# Patient Record
Sex: Male | Born: 1965 | Race: White
Health system: Southern US, Community
[De-identification: ages and names within clinical notes are randomized; demographics above are authoritative.]

## PROBLEM LIST (undated history)

## (undated) DIAGNOSIS — Z955 Presence of coronary angioplasty implant and graft: Secondary | ICD-10-CM

## (undated) DIAGNOSIS — R0602 Shortness of breath: Secondary | ICD-10-CM

## (undated) DIAGNOSIS — E785 Hyperlipidemia, unspecified: Secondary | ICD-10-CM

## (undated) DIAGNOSIS — I214 Non-ST elevation (NSTEMI) myocardial infarction: Secondary | ICD-10-CM

## (undated) DIAGNOSIS — I2510 Atherosclerotic heart disease of native coronary artery without angina pectoris: Secondary | ICD-10-CM

## (undated) DIAGNOSIS — F329 Major depressive disorder, single episode, unspecified: Secondary | ICD-10-CM

## (undated) DIAGNOSIS — F32A Depression, unspecified: Secondary | ICD-10-CM

## (undated) DIAGNOSIS — I201 Angina pectoris with documented spasm: Secondary | ICD-10-CM

## (undated) DIAGNOSIS — Q2549 Other congenital malformations of aorta: Secondary | ICD-10-CM

## (undated) DIAGNOSIS — I4901 Ventricular fibrillation: Secondary | ICD-10-CM

## (undated) DIAGNOSIS — Z95810 Presence of automatic (implantable) cardiac defibrillator: Secondary | ICD-10-CM

## (undated) DIAGNOSIS — F419 Anxiety disorder, unspecified: Secondary | ICD-10-CM

## (undated) HISTORY — DX: Non-ST elevation (NSTEMI) myocardial infarction: I21.4

## (undated) HISTORY — DX: Shortness of breath: R06.02

## (undated) HISTORY — DX: Hyperlipidemia, unspecified: E78.5

## (undated) HISTORY — DX: Presence of coronary angioplasty implant and graft: Z95.5

## (undated) HISTORY — DX: Atherosclerotic heart disease of native coronary artery without angina pectoris: I25.10

## (undated) HISTORY — PX: CORONARY ANGIOPLASTY: SHX604

## (undated) HISTORY — DX: Other congenital malformations of aorta: Q25.49

## (undated) HISTORY — PX: OTHER SURGICAL HISTORY: SHX169

## (undated) HISTORY — DX: Presence of automatic (implantable) cardiac defibrillator: Z95.810

## (undated) HISTORY — DX: Angina pectoris with documented spasm: I20.1

## (undated) HISTORY — DX: Ventricular fibrillation: I49.01

---

## 2010-08-31 ENCOUNTER — Inpatient Hospital Stay (HOSPITAL_COMMUNITY): Admission: EM | Admit: 2010-08-31 | Discharge: 2010-09-10 | Payer: Self-pay | Admitting: Emergency Medicine

## 2010-08-31 ENCOUNTER — Ambulatory Visit: Payer: Self-pay | Admitting: Cardiology

## 2010-08-31 ENCOUNTER — Ambulatory Visit: Payer: Self-pay | Admitting: Pulmonary Disease

## 2010-09-01 ENCOUNTER — Encounter: Payer: Self-pay | Admitting: Cardiovascular Disease

## 2010-09-09 ENCOUNTER — Ambulatory Visit: Payer: Self-pay | Admitting: Physical Medicine & Rehabilitation

## 2010-09-11 ENCOUNTER — Encounter: Payer: Self-pay | Admitting: Internal Medicine

## 2010-09-18 ENCOUNTER — Telehealth: Payer: Self-pay | Admitting: Internal Medicine

## 2010-09-18 ENCOUNTER — Encounter: Payer: Self-pay | Admitting: Internal Medicine

## 2010-09-18 ENCOUNTER — Ambulatory Visit: Payer: Self-pay

## 2010-10-02 ENCOUNTER — Ambulatory Visit: Payer: Self-pay | Admitting: Cardiovascular Disease

## 2010-10-02 DIAGNOSIS — I4901 Ventricular fibrillation: Secondary | ICD-10-CM | POA: Insufficient documentation

## 2010-10-15 DIAGNOSIS — I201 Angina pectoris with documented spasm: Secondary | ICD-10-CM | POA: Insufficient documentation

## 2010-10-20 ENCOUNTER — Telehealth (INDEPENDENT_AMBULATORY_CARE_PROVIDER_SITE_OTHER): Payer: Self-pay | Admitting: *Deleted

## 2010-10-21 ENCOUNTER — Encounter: Payer: Self-pay | Admitting: Cardiovascular Disease

## 2010-11-19 ENCOUNTER — Ambulatory Visit: Payer: Self-pay | Admitting: Cardiovascular Disease

## 2010-11-19 ENCOUNTER — Encounter: Payer: Self-pay | Admitting: Cardiovascular Disease

## 2010-11-24 ENCOUNTER — Encounter: Payer: Self-pay | Admitting: Cardiovascular Disease

## 2010-11-24 ENCOUNTER — Encounter (INDEPENDENT_AMBULATORY_CARE_PROVIDER_SITE_OTHER): Payer: Self-pay | Admitting: *Deleted

## 2010-11-24 LAB — CONVERTED CEMR LAB
ALT: 10 units/L (ref 0–53)
AST: 14 units/L (ref 0–37)
Albumin: 4.6 g/dL (ref 3.5–5.2)
Alkaline Phosphatase: 67 units/L (ref 39–117)
BUN: 17 mg/dL (ref 6–23)
CO2: 26 meq/L (ref 19–32)
Calcium: 9.6 mg/dL (ref 8.4–10.5)
Chloride: 100 meq/L (ref 96–112)
Cholesterol: 203 mg/dL — ABNORMAL HIGH (ref 0–200)
Creatinine, Ser: 1.01 mg/dL (ref 0.40–1.50)
Glucose, Bld: 72 mg/dL (ref 70–99)
HDL: 54 mg/dL (ref >39)
LDL Cholesterol: 110 mg/dL — ABNORMAL HIGH (ref 0–99)
PSA: 0.39 ng/mL (ref <=4.00)
Potassium: 4.5 meq/L (ref 3.5–5.3)
Sodium: 138 meq/L (ref 135–145)
TSH: 4.778 microintl units/mL — ABNORMAL HIGH (ref 0.350–4.500)
Total Bilirubin: 0.5 mg/dL (ref 0.3–1.2)
Total CHOL/HDL Ratio: 3.8
Total Protein: 7.3 g/dL (ref 6.0–8.3)
Triglycerides: 195 mg/dL — ABNORMAL HIGH (ref <150)
VLDL: 39 mg/dL (ref 0–40)

## 2010-11-27 ENCOUNTER — Encounter: Payer: Self-pay | Admitting: Cardiovascular Disease

## 2010-12-10 ENCOUNTER — Ambulatory Visit: Payer: Self-pay | Admitting: Internal Medicine

## 2010-12-11 ENCOUNTER — Encounter: Payer: Self-pay | Admitting: Internal Medicine

## 2011-01-27 NOTE — Assessment & Plan Note (Signed)
Summary: EPH   Visit Type:  Post-hospital Primary Provider:  None  CC:  Heart burn constantly.  History of Present Illness: 45 year-old male presents for followup evaluation after recent hospitalization. The patient had an out-of-hospital VF arrest and was defibrillated by EMS. He was intubated, admitted to the CCU, and underwent induced hypothermia. He developed recurrent VF arrest and following defibrillation he had marked inferoposterior injury current on his EKG. He was taken for emergent cardiac cath and was found to have patent coronaries. Vasospasm was presumed and an AICD was placed. He presents today for followup.  He continues to have episodic chest pains, predominately at night. He calls this 'heartburn' and 'indigestion,' but has not had these symptoms until recently. Denies exertional chest pain or dyspnea. No other complaints.    Current Medications (verified): 1)  Aspirin 325 Mg Tabs (Aspirin) .... Take 1 Tablet By Mouth Once Daily 2)  Diltiazem Hcl Cr 240 Mg Xr24h-Cap (Diltiazem Hcl) .... Take 1 Tablet By Mouth Once Daily  Allergies: 1)  ! * Smallpox  Past History:  Past medical history reviewed for relevance to current acute and chronic problems.  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 smoking for more than 20 years Alcohol use- yes No drug use  Review of Systems       Negative except as per HPI   Vital Signs:  Patient profile:   45 year old male Height:      75 inches Weight:      166.75 pounds BMI:     20.92 Pulse rate:   56 / minute Pulse rhythm:   regular Resp:     18 per minute BP sitting:   110 / 80  (left arm) Cuff size:   large  Vitals Entered By: Vikki Ports (October 02, 2010 2:04 PM)  Physical  Exam  General:  Pt is alert and oriented, in no acute distress. Flat affect. HEENT: normal Neck: normal carotid upstrokes without bruits, JVP normal Lungs: CTA CV: RRR without murmur or gallop Abd: soft, NT, positive BS, no bruit, no organomegaly Ext: no clubbing, cyanosis, or edema. peripheral pulses 2+ and equal Skin: warm and dry without rash    EKG  Procedure date:  10/02/2010  Findings:      Sinus bradycardia otherwise within normal limits HR 56 bpm.   ICD Specifications Following MD:  Hillis Range, MD     ICD Vendor:  St Jude     ICD Model Number:  [id number]     ICD Serial Number:  [id number] ICD DOI:  09/08/2010     ICD Implanting MD:  Hillis Range, MD  Lead 1:    Location: RV     DOI: 09/08/2010     Model #: 8119     Serial #: [id number]     Status: active  Indications::  VF arrest   Huston Foley Parameters Mode VVI     Lower Rate Limit:  40      Tachy Zones VF:  222     VT:  176     Impression & Recommendations:  Problem # 1:  VENTRICULAR FIBRILLATION (ICD-427.41) No ICD discharges. Will have upcoming device and EP followup.  His updated medication list for this problem includes:    Aspirin 325 Mg Tabs (Aspirin) .Marland KitchenMarland KitchenMarland KitchenMarland Kitchen  Take 1 tablet by mouth once daily    Diltiazem Hcl 90 Mg Tabs (Diltiazem hcl) .Marland Kitchen... 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 We discussed event recording for ST segment analysis, but cost is a major issue. Will empirically start a PPI. Also will increase diltiazem and change to a short-acting preparation to reduce cost. Will followup in one month. The patient feels depressed and I have taken the liberty of starting him on an SSRI until he is able to establish with a PCP.  His updated medication list for this problem includes:    Aspirin 325 Mg Tabs (Aspirin) .Marland Kitchen... Take 1 tablet by mouth once daily    Diltiazem  Hcl 90 Mg Tabs (Diltiazem hcl) .Marland Kitchen... Take one tablet three times a day  Patient Instructions: 1)  Your physician recommends that you schedule a follow-up appointment in: 1 MONTH 2)  Your physician has recommended you make the following change in your medication: START Ranitidine 150mg  two times a day, START Citalopram 20mg  once a day (future refills should come from PCP), START Diltiazem 90mg  one tablet by mouth three times a day  Prescriptions: CITALOPRAM HYDROBROMIDE 20 MG TABS (CITALOPRAM HYDROBROMIDE) take one tablet daily  #90 x 0   Entered by:   Julieta Gutting, RN, BSN   Authorized by:   Norva Karvonen, MD   Signed by:   Julieta Gutting, RN, BSN on 10/02/2010   Method used:   Electronically to        [facility] (retail)       [address]       [place], [place]  [address]       Ph: [phone]       Fax: [phone]   RxID:   [id number] RANITIDINE HCL 150 MG TABS (RANITIDINE HCL) take one tablet two times a day  #180 x 2   Entered by:   Julieta Gutting, RN, BSN   Authorized by:   Norva Karvonen, MD   Signed by:   Julieta Gutting, RN, BSN on 10/02/2010   Method used:   Electronically to        [facility] (retail)       [address]       [place], [place]  [address]       Ph: [phone]       Fax: [phone]   RxID:   [id number] DILTIAZEM HCL 90 MG TABS (DILTIAZEM HCL) take one tablet three times a day  #270 x 3   Entered by:   Julieta Gutting, RN, BSN   Authorized by:   Norva Karvonen, MD   Signed by:   Julieta Gutting, RN, BSN on 10/02/2010   Method used:   Electronically to        [facility] (retail)       [address]       [place], [place]  [address]       Ph: [phone]       Fax: [phone]   RxID:   [id number]

## 2011-01-27 NOTE — Assessment & Plan Note (Signed)
Summary: ROV   Visit Type:  Follow-up Primary Elias Bordner:  None  CC:  dizziness. - meds side effects.  History of Present Illness: 45 year-old male presents for followup evaluation. The patient had an out-of-hospital VF arrest earlier this year. He was intubated, admitted to the CCU, and underwent induced hypothermia. He developed recurrent VF arrest and following defibrillation he had marked inferoposterior injury current on his EKG. He was taken for emergent cardiac cath and was found to have patent coronaries. Vasospasm was presumed and an AICD was placed. He presents today for followup.  The patient is doing better overall. He reports dizziness and attributes this to taking diltiazem. He denies syncope. 'Indigestion' is improved and he denies exertional chest pain or dyspnea. He does complain of fatigue. He was started on citalopram at his last visit here and his mother who is with him today feels it has really helped.  Current Medications (verified): 1)  Aspirin 325 Mg Tabs (Aspirin) .... Take 1 Tablet By Mouth Once Daily 2)  Diltiazem Hcl 90 Mg Tabs (Diltiazem Hcl) .... Take One Tablet Three Times A Day 3)  Ranitidine Hcl 150 Mg Tabs (Ranitidine Hcl) .... Take One Tablet Two Times A Day 4)  Citalopram Hydrobromide 20 Mg Tabs (Citalopram Hydrobromide) .... Take One Tablet Daily  Allergies: 1)  ! * Smallpox  Past History:  Past medical history reviewed for relevance to current acute and chronic problems.  Past Medical History: Nonobstructive CAD VF arrest Preserved LV function Suspected coronary vasospasm S/P ICD  Review of Systems       Negative except as per HPI   Vital Signs:  Patient profile:   45 year old male Height:      75 inches Weight:      174.50 pounds BMI:     21.89 Pulse rate:   54 / minute Pulse rhythm:   regular Resp:     18 per minute BP sitting:   120 / 90  (left arm) Cuff size:   large  Vitals Entered By: Vikki Ports (November 19, 2010 4:00  PM)  Physical Exam  General:  Pt is alert and oriented, in no acute distress. Flat affect. HEENT: normal Neck: normal carotid upstrokes without bruits, JVP normal Lungs: CTA CV: RRR without murmur or gallop Abd: soft, NT, positive BS, no bruit, no organomegaly Ext: no clubbing, cyanosis, or edema. peripheral pulses 2+ and equal Skin: warm and dry without rash    EKG  Procedure date:  11/19/2010  Findings:      Sinus brady 53 bpm, otherwise within normal limits   ICD Specifications Following MD:  Hillis Range, MD     ICD Vendor:  St Jude     ICD Model Number:  [phone]     ICD Serial Number:  [id number] ICD DOI:  09/08/2010     ICD Implanting MD:  Hillis Range, MD  Lead 1:    Location: RV     DOI: 09/08/2010     Model #: 8119     Serial #: [id number]     Status: active  Indications::  VF arrest   Huston Foley Parameters Mode VVI     Lower Rate Limit:  40      Tachy Zones VF:  222     VT:  176     Impression & Recommendations:  Problem # 1:  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 I have trouble differentiating between angina and heartburn, but his symptoms are  better on diltiazem and ranitidine. Recommend decrease diltiazem to 1/2 tablet three times a day to see if this helps with his dizziness.  I asked the patient to have his lipids checked at [facility] as I could not find records of a lipid panel in his hospital records. Pending those results will consider a statin if needed.  His updated medication list for this problem includes:    Aspirin 325 Mg Tabs (Aspirin) .Marland Kitchen... Take 1 tablet by mouth once daily    Diltiazem Hcl 90 Mg Tabs (Diltiazem hcl) .Marland Kitchen... Take one-half  tablet three times a day  Orders: EKG w/ Interpretation (93000)  Problem # 2:  VENTRICULAR FIBRILLATION (ICD-427.41) S/P ICD. No device discharges. Followup per protocol.  His updated medication list for this problem includes:    Aspirin 325 Mg Tabs (Aspirin) .Marland Kitchen... Take 1 tablet by  mouth once daily    Diltiazem Hcl 90 Mg Tabs (Diltiazem hcl) .Marland Kitchen... Take one-half  tablet three times a day  Orders: EKG w/ Interpretation (93000)  Patient Instructions: 1)  Your physician recommends that you schedule a follow-up appointment in: 3 MONTHS 2)  Your physician recommends that you have a FASTING LIPID Panel with [facility] with your next blood draw. ([phone]  Please have results faxed to [phone] ATTN: Lauren RN 3)  Your physician has recommended you make the following change in your medication: DECREASE Diltiazem to 90mg  take one-half tablet three times a day

## 2011-01-27 NOTE — Cardiovascular Report (Signed)
Summary: Office Visit   Office Visit   Imported By: Roderic Ovens 09/24/2010 15:25:19  _____________________________________________________________________  External Attachment:    Type:   Image     Comment:   External Document

## 2011-01-27 NOTE — Progress Notes (Signed)
Summary: pt's mom has questions  Phone Note Call from Patient   Caller: Claudean Kinds [phone] Reason for Call: Talk to Nurse Summary of Call: pt's mom was told they would get a card stating that pt has a pacemaker-is his something that will be mailedto them because they didn't get one yet,also wants to know how to get a medic alert braclet Initial call taken by: Glynda Jaeger,  September 18, 2010 4:13 PM  Additional Follow-up for Phone Call Additional follow up Details #1::        pt received id card on 09-22-10.  Vella Kohler  September 23, 2010 12:01 PM

## 2011-01-27 NOTE — Miscellaneous (Signed)
Summary: Device preload  Clinical Lists Changes  Observations: Added new observation of ICD INDICATN: VF arrest (09/11/2010 12:41) Added new observation of ICDLEADSTAT1: active (09/11/2010 12:41) Added new observation of ICDLEADSER1: [id number] (09/11/2010 12:41) Added new observation of ICDLEADMOD1: 7122  (09/11/2010 12:41) Added new observation of ICDLEADLOC1: RV  (09/11/2010 12:41) Added new observation of ICD IMP MD: Hillis Range, MD  (09/11/2010 12:41) Added new observation of ICDLEADDOI1: 09/08/2010  (09/11/2010 12:41) Added new observation of ICD IMPL DTE: 09/08/2010  (09/11/2010 12:41) Added new observation of ICD SERL#: [id number]  (09/11/2010 12:41) Added new observation of ICD MODL#: [id number]  (09/11/2010 30:86) Added new observation of ICDMANUFACTR: St Jude  (09/11/2010 12:41) Added new observation of ICD MD: Hillis Range, MD  (09/11/2010 12:41)       ICD Specifications Following MD:  Hillis Range, MD     ICD Vendor:  St Jude     ICD Model Number:  [id number]     ICD Serial Number:  [id number] ICD DOI:  09/08/2010     ICD Implanting MD:  Hillis Range, MD  Lead 1:    Location: RV     DOI: 09/08/2010     Model #: 4132     Serial #: [id number]     Status: active  Indications::  VF arrest

## 2011-01-27 NOTE — Procedures (Signed)
Summary: wound check.sjm.amber   Current Medications (verified): 1)  Aspirin 325 Mg Tabs (Aspirin) .... Take 1 Tablet By Mouth Once Daily 2)  Diltiazem Hcl Cr 240 Mg Xr24h-Cap (Diltiazem Hcl) .... Take 1 Tablet By Mouth Once Daily  Allergies (verified): 1)  ! * Smallpox    ICD Specifications Following MD:  Hillis Range, MD     ICD Vendor:  St Jude     ICD Model Number:  [phone]     ICD Serial Number:  [id number] ICD DOI:  09/08/2010     ICD Implanting MD:  Hillis Range, MD  Lead 1:    Location: RV     DOI: 09/08/2010     Model #: 8119     Serial #: [id number]     Status: active  Indications::  VF arrest   ICD Follow Up Battery Voltage:  >95% V     Charge Time:  8.0 seconds     Battery Est. Longevity:  7.7-9.2 yrs Underlying rhythm:  SR   ICD Device Measurements Right Ventricle:  Amplitude: 9.9 mV, Impedance: 390 ohms, Threshold: 0.75 V at 0.5 msec Shock Impedance: 59 ohms   Episodes MS Episodes:  0     Shock:  0     ATP:  0     Nonsustained:  0     Atrial Therapies:  0 Ventricular Pacing:  <1%  Brady Parameters Mode VVI     Lower Rate Limit:  40      Tachy Zones VF:  222     VT:  176     Tech Comments:  WOUND CHECK---STERI STRIPS REMOVED.  NO REDNESS OR SWELLING AT SITE.  NORMAL DEVICE FUNCTION.  NO EPISODES SINCE IMPLANT.  NO CHANGES MADE. ROV 12-10-10 @ 945 W/JA.  WENT OVER INSTRUCTIONS ABOUT BEING SHOCKED. Vella Kohler  September 18, 2010 3:13 PM

## 2011-01-27 NOTE — Miscellaneous (Signed)
Summary: Home Health Certification/Care Plan   Home Health Certification/Care Plan   Imported By: Roderic Ovens 11/13/2010 17:37:19  _____________________________________________________________________  External Attachment:    Type:   Image     Comment:   External Document

## 2011-01-27 NOTE — Progress Notes (Signed)
  DDS request received sent to [employer]. [facility]  October 20, 2010 8:55 AM

## 2011-01-29 ENCOUNTER — Encounter (INDEPENDENT_AMBULATORY_CARE_PROVIDER_SITE_OTHER): Payer: Self-pay | Admitting: *Deleted

## 2011-01-29 LAB — CONVERTED CEMR LAB: TSH: 2.023 microintl units/mL (ref 0.350–4.500)

## 2011-01-29 NOTE — Procedures (Signed)
Summary: Cardiology Device Clinic   Current Medications (verified): 1)  Aspirin 325 Mg Tabs (Aspirin) .... Take 1 Tablet By Mouth Once Daily 2)  Diltiazem Hcl 90 Mg Tabs (Diltiazem Hcl) .... Take One-Half  Tablet Three Times A Day 3)  Ranitidine Hcl 150 Mg Tabs (Ranitidine Hcl) .... Take One Tablet Two Times A Day 4)  Citalopram Hydrobromide 20 Mg Tabs (Citalopram Hydrobromide) .... Take One Tablet Daily 5)  Synthroid 25 Mcg Tabs (Levothyroxine Sodium) .... Once Daily 6)  Vitamin C 500 Mg  Tabs (Ascorbic Acid) .... Once Daily  Allergies (verified): 1)  ! * Smallpox   ICD Specifications Following MD:  Hillis Range, MD     ICD Vendor:  St Jude     ICD Model Number:  [phone]     ICD Serial Number:  [id number] ICD DOI:  09/08/2010     ICD Implanting MD:  Hillis Range, MD  Lead 1:    Location: RV     DOI: 09/08/2010     Model #: 7829     Serial #: [id number]     Status: active  Indications::  VF arrest   ICD Follow Up Battery Voltage:  95% V     Charge Time:  8.0 seconds     Battery Est. Longevity:  9.1 yrs Underlying rhythm:  SR   ICD Device Measurements Right Ventricle:  Amplitude: 11.6 mV, Impedance: 350 ohms, Threshold: 0.75 V at 0.5 msec Shock Impedance: 65 ohms   Episodes MS Episodes:  0     Shock:  0     ATP:  0     Nonsustained:  0     Atrial Therapies:  0 Ventricular Pacing:  <1%  Brady Parameters Mode VVI     Lower Rate Limit:  40      Tachy Zones VF:  222     VT:  176     Next Cardiology Appt Due:  02/26/2011 Tech Comments:  NORMAL DEVICE FUNCTION.  NO EPISODES SINCE LAST CHECK.  CHANGED RV OUTPUT FROM 3.5 TO 2.5 V.  ROV IN 3 MTHS W/DEVICE CLINIC. Vella Kohler  December 11, 2010 9:08 AM

## 2011-01-29 NOTE — Letter (Signed)
Summary: [facility]   [facility]   Imported By: Roderic Ovens 12/18/2010 11:17:17  _____________________________________________________________________  External Attachment:    Type:   Image     Comment:   External Document

## 2011-01-29 NOTE — Miscellaneous (Signed)
Summary: [facility] Phase 2 for Qualified Medicaid Patients Repo  [facility] Phase 2 for Qualified Medicaid Patients Report   Imported By: Roderic Ovens 12/17/2010 15:56:22  _____________________________________________________________________  External Attachment:    Type:   Image     Comment:   External Document

## 2011-01-29 NOTE — Assessment & Plan Note (Signed)
Summary: defib check.sjm.amber   Visit Type:  Follow-up Referring Oney Folz:  Dr Excell Seltzer Primary Davonne Jarnigan:  None   History of Present Illness: The patient presents today for routine electrophysiology followup. He reports doing very well since having his ICD implanted. The patient denies symptoms of palpitations, chest pain, shortness of breath, orthopnea, PND, lower extremity edema, dizziness, presyncope, syncope, or neurologic sequela. The patient is tolerating medications without difficulties and is otherwise without complaint today.   Current Medications (verified): 1)  Aspirin 325 Mg Tabs (Aspirin) .... Take 1 Tablet By Mouth Once Daily 2)  Diltiazem Hcl 90 Mg Tabs (Diltiazem Hcl) .... Take One-Half  Tablet Three Times A Day 3)  Ranitidine Hcl 150 Mg Tabs (Ranitidine Hcl) .... Take One Tablet Two Times A Day 4)  Citalopram Hydrobromide 20 Mg Tabs (Citalopram Hydrobromide) .... Take One Tablet Daily 5)  Synthroid 25 Mcg Tabs (Levothyroxine Sodium) .... Once Daily 6)  Vitamin C 500 Mg  Tabs (Ascorbic Acid) .... Once Daily  Allergies: 1)  ! * Smallpox  Past History:  Past Medical History: Reviewed history from 11/19/2010 and no changes required. 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  ICD implantation with defibrillation threshold testing.      Social History: Reviewed history from 10/02/2010 and no changes required. Divorced 2 children quit smoking 2011. smoking for more than 20 years Alcohol use- yes No drug use  Review of Systems       All systems are reviewed and negative except as listed in the HPI.   Vital Signs:  Patient profile:   45 year old male Height:      75 inches Weight:      179 pounds BMI:     22.45 Pulse rate:   66 / minute BP sitting:   110 / 84  (left arm)  Vitals Entered By: Laurance Flatten CMA (December 10, 2010  4:13 PM)  Physical Exam  General:  Well developed, well nourished, in no acute distress. Head:  normocephalic and atraumatic Mouth:  Teeth, gums and palate normal. Oral mucosa normal. Neck:  supple Chest Wall:  ICD pocket is well healed Lungs:  Clear bilaterally to auscultation and percussion. Heart:  Non-displaced PMI, chest non-tender; regular rate and rhythm, S1, S2 without murmurs, rubs or gallops. Carotid upstroke normal, no bruit. Normal abdominal aortic size, no bruits. Femorals normal pulses, no bruits. Pedals normal pulses. No edema, no varicosities. Abdomen:  Bowel sounds positive; abdomen soft and non-tender without masses, organomegaly, or hernias noted. No hepatosplenomegaly. Msk:  Back normal, normal gait. Muscle strength and tone normal. Extremities:  No clubbing or cyanosis. Neurologic:  Alert and oriented x 3.    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 If you don't receive a letter, please call our office to schedule the follow-up appointment.

## 2011-02-26 ENCOUNTER — Encounter: Payer: Self-pay | Admitting: Cardiovascular Disease

## 2011-02-26 ENCOUNTER — Encounter: Payer: Self-pay | Admitting: Internal Medicine

## 2011-02-26 ENCOUNTER — Encounter (INDEPENDENT_AMBULATORY_CARE_PROVIDER_SITE_OTHER): Payer: Self-pay

## 2011-02-26 ENCOUNTER — Ambulatory Visit (INDEPENDENT_AMBULATORY_CARE_PROVIDER_SITE_OTHER): Payer: Self-pay | Admitting: Cardiovascular Disease

## 2011-02-26 DIAGNOSIS — I428 Other cardiomyopathies: Secondary | ICD-10-CM

## 2011-02-26 DIAGNOSIS — I2510 Atherosclerotic heart disease of native coronary artery without angina pectoris: Secondary | ICD-10-CM

## 2011-02-26 DIAGNOSIS — R072 Precordial pain: Secondary | ICD-10-CM

## 2011-03-04 ENCOUNTER — Telehealth (INDEPENDENT_AMBULATORY_CARE_PROVIDER_SITE_OTHER): Payer: Self-pay | Admitting: *Deleted

## 2011-03-05 NOTE — Assessment & Plan Note (Signed)
Summary: F3M/ FROM 11/19/10 OV/LWB/SAF   Visit Type:  Follow-up Referring Provider:  Dr Excell Seltzer Primary Provider:  None  CC:  chest pains.  History of Present Illness: 45 year-old male presents for followup evaluation. The patient had an out-of-hospital VF arrest earlier this year. He was intubated, admitted to the CCU, and underwent induced hypothermia. He developed recurrent VF arrest and following defibrillation he had marked inferoposterior injury current on his EKG. He was taken for emergent cardiac cath and was found to have patent coronaries. Vasospasm was presumed and an AICD was placed. He presents today for followup.  The patient continues to have near-daily episodes of chest pain, described as a burning, pressure-like feeling across his upper chest. These are more frequent in the morning and they feel similar to the symptoms he had leading up to his cardiac arrest. He has had no ICD discharges. He sits and rests when the chest pain occurs and symptoms usually resolve within 5 minutes. He has started a daily PPI but this hasn't helped. No other complaints.  Current Medications (verified): 1)  Aspirin 325 Mg Tabs (Aspirin) .... Take 1 Tablet By Mouth Once Daily 2)  Diltiazem Hcl 90 Mg Tabs (Diltiazem Hcl) .... Take One-Half  Tablet Three Times A Day 3)  Pantoprazole Sodium 40 Mg Tbec (Pantoprazole Sodium) .... Take 1 Tablet By Mouth Once A Day 4)  Citalopram Hydrobromide 20 Mg Tabs (Citalopram Hydrobromide) .... Take One Tablet Daily 5)  Synthroid 25 Mcg Tabs (Levothyroxine Sodium) .... Once Daily 6)  Vitamin C 500 Mg  Tabs (Ascorbic Acid) .... Once Daily 7)  Citalopram Hydrobromide 20 Mg Tabs (Citalopram Hydrobromide) .... Take 1 Tablet By Mouth Once A Day  Allergies: 1)  ! * Smallpox  Past History:  Past medical history reviewed for relevance to current acute and chronic problems.  Past Medical History: Reviewed history from 11/19/2010 and no changes required. 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 HEENT: normal Neck: normal carotid upstrokes without bruits, JVP normal Lungs: CTA CV: RRR without murmur or gallop Abd: soft, NT, positive BS, no bruit, no organomegaly Ext: no clubbing, cyanosis, or edema. peripheral pulses 2+ and equal Skin: warm and dry without rash    EKG  Procedure date:  02/26/2011  Findings:      NSR 64 bpm, within normal limits.   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 Recommend add Imdur 15 mg daily (low-dose because of low blood pressure). Will followup in 3 months to see if he has had a good symptomatic response. He is having trouble with abtaining disability and Medicaid - I am going to write him a letter to support his disability.  His updated medication list for this problem includes:    Aspirin 325 Mg Tabs (Aspirin) .Marland Kitchen... Take 1 tablet by mouth once daily    Diltiazem Hcl 90 Mg Tabs (Diltiazem hcl) .Marland Kitchen... Take one-half  tablet three times a day    Imdur 30  Mg Xr24h-tab (Isosorbide mononitrate) .Marland Kitchen... Take 1/2 tab everyday at bedtime  Problem # 2:  VENTRICULAR FIBRILLATION (ICD-427.41) No ICD discharges. Continue followup with Dr Johney Frame.  His updated medication list for this problem includes:    Aspirin 325 Mg Tabs (Aspirin) .Marland Kitchen... Take 1 tablet by mouth once daily    Diltiazem Hcl 90 Mg Tabs (Diltiazem hcl) .Marland Kitchen... Take one-half  tablet three times a day    Imdur 30 Mg Xr24h-tab (Isosorbide mononitrate) .Marland Kitchen... Take 1/2 tab everyday at bedtime  Patient Instructions: 1)  Your physician recommends that you schedule a follow-up appointment in: 3 months 2)  Your physician has recommended you make the following change in your medication: please start imdur 15mg  everyday at bedtime( 1/2 tab) Prescriptions: IMDUR 30 MG XR24H-TAB (ISOSORBIDE MONONITRATE) take 1/2 tab everyday at bedtime  #15 x 10   Entered by:   Ledon Snare, RN   Authorized by:   Norva Karvonen, MD   Signed by:   Ledon Snare, RN on 02/26/2011   Method used:   Electronically to        [facility] (retail)       [address]       [place], [place]  [address]       Ph: [phone]       Fax: [phone]   RxID:   [id number]

## 2011-03-10 NOTE — Progress Notes (Signed)
  DDS request received sent to [facility]  March 04, 2011 3:09 PM

## 2011-03-12 LAB — DIFFERENTIAL
Basophils Absolute: 0 10*3/uL (ref 0.0–0.1)
Basophils Absolute: 0 10*3/uL (ref 0.0–0.1)
Basophils Absolute: 0 10*3/uL (ref 0.0–0.1)
Basophils Relative: 0 % (ref 0–1)
Basophils Relative: 0 % (ref 0–1)
Basophils Relative: 1 % (ref 0–1)
Eosinophils Absolute: 0.1 10*3/uL (ref 0.0–0.7)
Eosinophils Absolute: 0.1 10*3/uL (ref 0.0–0.7)
Eosinophils Absolute: 0.1 10*3/uL (ref 0.0–0.7)
Eosinophils Relative: 1 % (ref 0–5)
Eosinophils Relative: 2 % (ref 0–5)
Eosinophils Relative: 2 % (ref 0–5)
Lymphocytes Relative: 20 % (ref 12–46)
Lymphocytes Relative: 23 % (ref 12–46)
Lymphocytes Relative: 25 % (ref 12–46)
Lymphs Abs: 1.2 10*3/uL (ref 0.7–4.0)
Lymphs Abs: 1.8 10*3/uL (ref 0.7–4.0)
Lymphs Abs: 1.9 10*3/uL (ref 0.7–4.0)
Monocytes Absolute: 0.5 10*3/uL (ref 0.1–1.0)
Monocytes Absolute: 0.8 10*3/uL (ref 0.1–1.0)
Monocytes Absolute: 1.2 10*3/uL — ABNORMAL HIGH (ref 0.1–1.0)
Monocytes Relative: 14 % — ABNORMAL HIGH (ref 3–12)
Monocytes Relative: 14 % — ABNORMAL HIGH (ref 3–12)
Monocytes Relative: 6 % (ref 3–12)
Neutro Abs: 4 10*3/uL (ref 1.7–7.7)
Neutro Abs: 4.8 10*3/uL (ref 1.7–7.7)
Neutro Abs: 4.8 10*3/uL (ref 1.7–7.7)
Neutrophils Relative %: 60 % (ref 43–77)
Neutrophils Relative %: 65 % (ref 43–77)
Neutrophils Relative %: 67 % (ref 43–77)

## 2011-03-12 LAB — BASIC METABOLIC PANEL
BUN: 1 mg/dL — ABNORMAL LOW (ref 6–23)
BUN: 2 mg/dL — ABNORMAL LOW (ref 6–23)
BUN: 3 mg/dL — ABNORMAL LOW (ref 6–23)
BUN: 3 mg/dL — ABNORMAL LOW (ref 6–23)
BUN: 4 mg/dL — ABNORMAL LOW (ref 6–23)
BUN: 4 mg/dL — ABNORMAL LOW (ref 6–23)
BUN: 4 mg/dL — ABNORMAL LOW (ref 6–23)
BUN: 5 mg/dL — ABNORMAL LOW (ref 6–23)
BUN: 5 mg/dL — ABNORMAL LOW (ref 6–23)
BUN: 5 mg/dL — ABNORMAL LOW (ref 6–23)
BUN: 5 mg/dL — ABNORMAL LOW (ref 6–23)
BUN: 5 mg/dL — ABNORMAL LOW (ref 6–23)
BUN: 5 mg/dL — ABNORMAL LOW (ref 6–23)
BUN: 5 mg/dL — ABNORMAL LOW (ref 6–23)
BUN: 6 mg/dL (ref 6–23)
BUN: 7 mg/dL (ref 6–23)
BUN: <1 mg/dL — ABNORMAL LOW (ref 6–23)
CO2: 17 mEq/L — ABNORMAL LOW (ref 19–32)
CO2: 18 mEq/L — ABNORMAL LOW (ref 19–32)
CO2: 18 mEq/L — ABNORMAL LOW (ref 19–32)
CO2: 18 mEq/L — ABNORMAL LOW (ref 19–32)
CO2: 18 mEq/L — ABNORMAL LOW (ref 19–32)
CO2: 18 mEq/L — ABNORMAL LOW (ref 19–32)
CO2: 18 mEq/L — ABNORMAL LOW (ref 19–32)
CO2: 19 mEq/L (ref 19–32)
CO2: 19 mEq/L (ref 19–32)
CO2: 20 mEq/L (ref 19–32)
CO2: 20 mEq/L (ref 19–32)
CO2: 20 mEq/L (ref 19–32)
CO2: 20 mEq/L (ref 19–32)
CO2: 24 mEq/L (ref 19–32)
CO2: 25 mEq/L (ref 19–32)
CO2: 25 mEq/L (ref 19–32)
CO2: 27 mEq/L (ref 19–32)
Calcium: 7.9 mg/dL — ABNORMAL LOW (ref 8.4–10.5)
Calcium: 7.9 mg/dL — ABNORMAL LOW (ref 8.4–10.5)
Calcium: 8 mg/dL — ABNORMAL LOW (ref 8.4–10.5)
Calcium: 8.1 mg/dL — ABNORMAL LOW (ref 8.4–10.5)
Calcium: 8.1 mg/dL — ABNORMAL LOW (ref 8.4–10.5)
Calcium: 8.1 mg/dL — ABNORMAL LOW (ref 8.4–10.5)
Calcium: 8.1 mg/dL — ABNORMAL LOW (ref 8.4–10.5)
Calcium: 8.2 mg/dL — ABNORMAL LOW (ref 8.4–10.5)
Calcium: 8.2 mg/dL — ABNORMAL LOW (ref 8.4–10.5)
Calcium: 8.3 mg/dL — ABNORMAL LOW (ref 8.4–10.5)
Calcium: 8.4 mg/dL (ref 8.4–10.5)
Calcium: 8.5 mg/dL (ref 8.4–10.5)
Calcium: 8.5 mg/dL (ref 8.4–10.5)
Calcium: 8.5 mg/dL (ref 8.4–10.5)
Calcium: 8.5 mg/dL (ref 8.4–10.5)
Calcium: 8.6 mg/dL (ref 8.4–10.5)
Calcium: 9.5 mg/dL (ref 8.4–10.5)
Chloride: 103 mEq/L (ref 96–112)
Chloride: 108 mEq/L (ref 96–112)
Chloride: 109 mEq/L (ref 96–112)
Chloride: 109 mEq/L (ref 96–112)
Chloride: 111 mEq/L (ref 96–112)
Chloride: 112 mEq/L (ref 96–112)
Chloride: 112 mEq/L (ref 96–112)
Chloride: 113 mEq/L — ABNORMAL HIGH (ref 96–112)
Chloride: 115 mEq/L — ABNORMAL HIGH (ref 96–112)
Chloride: 115 mEq/L — ABNORMAL HIGH (ref 96–112)
Chloride: 115 mEq/L — ABNORMAL HIGH (ref 96–112)
Chloride: 116 mEq/L — ABNORMAL HIGH (ref 96–112)
Chloride: 116 mEq/L — ABNORMAL HIGH (ref 96–112)
Chloride: 117 mEq/L — ABNORMAL HIGH (ref 96–112)
Chloride: 117 mEq/L — ABNORMAL HIGH (ref 96–112)
Chloride: 118 mEq/L — ABNORMAL HIGH (ref 96–112)
Chloride: 119 mEq/L — ABNORMAL HIGH (ref 96–112)
Creatinine, Ser: 0.52 mg/dL (ref 0.4–1.5)
Creatinine, Ser: 0.54 mg/dL (ref 0.4–1.5)
Creatinine, Ser: 0.55 mg/dL (ref 0.4–1.5)
Creatinine, Ser: 0.57 mg/dL (ref 0.4–1.5)
Creatinine, Ser: 0.63 mg/dL (ref 0.4–1.5)
Creatinine, Ser: 0.64 mg/dL (ref 0.4–1.5)
Creatinine, Ser: 0.65 mg/dL (ref 0.4–1.5)
Creatinine, Ser: 0.65 mg/dL (ref 0.4–1.5)
Creatinine, Ser: 0.67 mg/dL (ref 0.4–1.5)
Creatinine, Ser: 0.68 mg/dL (ref 0.4–1.5)
Creatinine, Ser: 0.69 mg/dL (ref 0.4–1.5)
Creatinine, Ser: 0.71 mg/dL (ref 0.4–1.5)
Creatinine, Ser: 0.87 mg/dL (ref 0.4–1.5)
Creatinine, Ser: 0.88 mg/dL (ref 0.4–1.5)
Creatinine, Ser: 0.89 mg/dL (ref 0.4–1.5)
Creatinine, Ser: 0.9 mg/dL (ref 0.4–1.5)
Creatinine, Ser: 0.95 mg/dL (ref 0.4–1.5)
GFR calc Af Amer: >60 mL/min (ref >60)
GFR calc Af Amer: >60 mL/min (ref >60)
GFR calc Af Amer: >60 mL/min (ref >60)
GFR calc Af Amer: >60 mL/min (ref >60)
GFR calc Af Amer: >60 mL/min (ref >60)
GFR calc Af Amer: >60 mL/min (ref >60)
GFR calc Af Amer: >60 mL/min (ref >60)
GFR calc Af Amer: >60 mL/min (ref >60)
GFR calc Af Amer: >60 mL/min (ref >60)
GFR calc Af Amer: >60 mL/min (ref >60)
GFR calc Af Amer: >60 mL/min (ref >60)
GFR calc Af Amer: >60 mL/min (ref >60)
GFR calc Af Amer: >60 mL/min (ref >60)
GFR calc Af Amer: >60 mL/min (ref >60)
GFR calc Af Amer: >60 mL/min (ref >60)
GFR calc Af Amer: >60 mL/min (ref >60)
GFR calc Af Amer: >60 mL/min (ref >60)
GFR calc non Af Amer: >60 mL/min (ref >60)
GFR calc non Af Amer: >60 mL/min (ref >60)
GFR calc non Af Amer: >60 mL/min (ref >60)
GFR calc non Af Amer: >60 mL/min (ref >60)
GFR calc non Af Amer: >60 mL/min (ref >60)
GFR calc non Af Amer: >60 mL/min (ref >60)
GFR calc non Af Amer: >60 mL/min (ref >60)
GFR calc non Af Amer: >60 mL/min (ref >60)
GFR calc non Af Amer: >60 mL/min (ref >60)
GFR calc non Af Amer: >60 mL/min (ref >60)
GFR calc non Af Amer: >60 mL/min (ref >60)
GFR calc non Af Amer: >60 mL/min (ref >60)
GFR calc non Af Amer: >60 mL/min (ref >60)
GFR calc non Af Amer: >60 mL/min (ref >60)
GFR calc non Af Amer: >60 mL/min (ref >60)
GFR calc non Af Amer: >60 mL/min (ref >60)
GFR calc non Af Amer: >60 mL/min (ref >60)
Glucose, Bld: 107 mg/dL — ABNORMAL HIGH (ref 70–99)
Glucose, Bld: 114 mg/dL — ABNORMAL HIGH (ref 70–99)
Glucose, Bld: 116 mg/dL — ABNORMAL HIGH (ref 70–99)
Glucose, Bld: 117 mg/dL — ABNORMAL HIGH (ref 70–99)
Glucose, Bld: 117 mg/dL — ABNORMAL HIGH (ref 70–99)
Glucose, Bld: 122 mg/dL — ABNORMAL HIGH (ref 70–99)
Glucose, Bld: 138 mg/dL — ABNORMAL HIGH (ref 70–99)
Glucose, Bld: 139 mg/dL — ABNORMAL HIGH (ref 70–99)
Glucose, Bld: 162 mg/dL — ABNORMAL HIGH (ref 70–99)
Glucose, Bld: 183 mg/dL — ABNORMAL HIGH (ref 70–99)
Glucose, Bld: 194 mg/dL — ABNORMAL HIGH (ref 70–99)
Glucose, Bld: 207 mg/dL — ABNORMAL HIGH (ref 70–99)
Glucose, Bld: 86 mg/dL (ref 70–99)
Glucose, Bld: 87 mg/dL (ref 70–99)
Glucose, Bld: 87 mg/dL (ref 70–99)
Glucose, Bld: 90 mg/dL (ref 70–99)
Glucose, Bld: 90 mg/dL (ref 70–99)
Potassium: 2.8 mEq/L — ABNORMAL LOW (ref 3.5–5.1)
Potassium: 2.9 mEq/L — ABNORMAL LOW (ref 3.5–5.1)
Potassium: 3.1 mEq/L — ABNORMAL LOW (ref 3.5–5.1)
Potassium: 3.1 mEq/L — ABNORMAL LOW (ref 3.5–5.1)
Potassium: 3.5 mEq/L (ref 3.5–5.1)
Potassium: 3.6 mEq/L (ref 3.5–5.1)
Potassium: 3.7 mEq/L (ref 3.5–5.1)
Potassium: 3.7 mEq/L (ref 3.5–5.1)
Potassium: 3.8 mEq/L (ref 3.5–5.1)
Potassium: 4 mEq/L (ref 3.5–5.1)
Potassium: 4.2 mEq/L (ref 3.5–5.1)
Potassium: 4.3 mEq/L (ref 3.5–5.1)
Potassium: 4.3 mEq/L (ref 3.5–5.1)
Potassium: 4.3 mEq/L (ref 3.5–5.1)
Potassium: 4.6 mEq/L (ref 3.5–5.1)
Potassium: 4.6 mEq/L (ref 3.5–5.1)
Potassium: 4.9 mEq/L (ref 3.5–5.1)
Sodium: 134 mEq/L — ABNORMAL LOW (ref 135–145)
Sodium: 136 mEq/L (ref 135–145)
Sodium: 137 mEq/L (ref 135–145)
Sodium: 137 mEq/L (ref 135–145)
Sodium: 137 mEq/L (ref 135–145)
Sodium: 138 mEq/L (ref 135–145)
Sodium: 138 mEq/L (ref 135–145)
Sodium: 138 mEq/L (ref 135–145)
Sodium: 138 mEq/L (ref 135–145)
Sodium: 139 mEq/L (ref 135–145)
Sodium: 139 mEq/L (ref 135–145)
Sodium: 139 mEq/L (ref 135–145)
Sodium: 140 mEq/L (ref 135–145)
Sodium: 140 mEq/L (ref 135–145)
Sodium: 140 mEq/L (ref 135–145)
Sodium: 141 mEq/L (ref 135–145)
Sodium: 141 mEq/L (ref 135–145)

## 2011-03-12 LAB — GLUCOSE, CAPILLARY
Glucose-Capillary: 100 mg/dL — ABNORMAL HIGH (ref 70–99)
Glucose-Capillary: 100 mg/dL — ABNORMAL HIGH (ref 70–99)
Glucose-Capillary: 101 mg/dL — ABNORMAL HIGH (ref 70–99)
Glucose-Capillary: 105 mg/dL — ABNORMAL HIGH (ref 70–99)
Glucose-Capillary: 105 mg/dL — ABNORMAL HIGH (ref 70–99)
Glucose-Capillary: 114 mg/dL — ABNORMAL HIGH (ref 70–99)
Glucose-Capillary: 117 mg/dL — ABNORMAL HIGH (ref 70–99)
Glucose-Capillary: 126 mg/dL — ABNORMAL HIGH (ref 70–99)
Glucose-Capillary: 131 mg/dL — ABNORMAL HIGH (ref 70–99)
Glucose-Capillary: 151 mg/dL — ABNORMAL HIGH (ref 70–99)
Glucose-Capillary: 151 mg/dL — ABNORMAL HIGH (ref 70–99)
Glucose-Capillary: 152 mg/dL — ABNORMAL HIGH (ref 70–99)
Glucose-Capillary: 171 mg/dL — ABNORMAL HIGH (ref 70–99)
Glucose-Capillary: 173 mg/dL — ABNORMAL HIGH (ref 70–99)
Glucose-Capillary: 178 mg/dL — ABNORMAL HIGH (ref 70–99)
Glucose-Capillary: 182 mg/dL — ABNORMAL HIGH (ref 70–99)
Glucose-Capillary: 184 mg/dL — ABNORMAL HIGH (ref 70–99)
Glucose-Capillary: 188 mg/dL — ABNORMAL HIGH (ref 70–99)
Glucose-Capillary: 188 mg/dL — ABNORMAL HIGH (ref 70–99)
Glucose-Capillary: 61 mg/dL — ABNORMAL LOW (ref 70–99)
Glucose-Capillary: 71 mg/dL (ref 70–99)
Glucose-Capillary: 79 mg/dL (ref 70–99)
Glucose-Capillary: 83 mg/dL (ref 70–99)
Glucose-Capillary: 83 mg/dL (ref 70–99)
Glucose-Capillary: 84 mg/dL (ref 70–99)
Glucose-Capillary: 84 mg/dL (ref 70–99)
Glucose-Capillary: 84 mg/dL (ref 70–99)
Glucose-Capillary: 85 mg/dL (ref 70–99)
Glucose-Capillary: 87 mg/dL (ref 70–99)
Glucose-Capillary: 88 mg/dL (ref 70–99)
Glucose-Capillary: 88 mg/dL (ref 70–99)
Glucose-Capillary: 89 mg/dL (ref 70–99)
Glucose-Capillary: 90 mg/dL (ref 70–99)
Glucose-Capillary: 92 mg/dL (ref 70–99)
Glucose-Capillary: 93 mg/dL (ref 70–99)
Glucose-Capillary: 93 mg/dL (ref 70–99)
Glucose-Capillary: 94 mg/dL (ref 70–99)
Glucose-Capillary: 95 mg/dL (ref 70–99)
Glucose-Capillary: 95 mg/dL (ref 70–99)
Glucose-Capillary: 96 mg/dL (ref 70–99)
Glucose-Capillary: 98 mg/dL (ref 70–99)
Glucose-Capillary: 99 mg/dL (ref 70–99)

## 2011-03-12 LAB — CBC
HCT: 32.3 % — ABNORMAL LOW (ref 39.0–52.0)
HCT: 32.6 % — ABNORMAL LOW (ref 39.0–52.0)
HCT: 33.8 % — ABNORMAL LOW (ref 39.0–52.0)
HCT: 33.9 % — ABNORMAL LOW (ref 39.0–52.0)
HCT: 34.5 % — ABNORMAL LOW (ref 39.0–52.0)
HCT: 36.2 % — ABNORMAL LOW (ref 39.0–52.0)
HCT: 36.7 % — ABNORMAL LOW (ref 39.0–52.0)
HCT: 36.8 % — ABNORMAL LOW (ref 39.0–52.0)
HCT: 36.9 % — ABNORMAL LOW (ref 39.0–52.0)
HCT: 38.4 % — ABNORMAL LOW (ref 39.0–52.0)
HCT: 39.1 % (ref 39.0–52.0)
HCT: 39.7 % (ref 39.0–52.0)
HCT: 40.1 % (ref 39.0–52.0)
Hemoglobin: 11 g/dL — ABNORMAL LOW (ref 13.0–17.0)
Hemoglobin: 11.4 g/dL — ABNORMAL LOW (ref 13.0–17.0)
Hemoglobin: 11.8 g/dL — ABNORMAL LOW (ref 13.0–17.0)
Hemoglobin: 11.9 g/dL — ABNORMAL LOW (ref 13.0–17.0)
Hemoglobin: 12.1 g/dL — ABNORMAL LOW (ref 13.0–17.0)
Hemoglobin: 12.2 g/dL — ABNORMAL LOW (ref 13.0–17.0)
Hemoglobin: 12.3 g/dL — ABNORMAL LOW (ref 13.0–17.0)
Hemoglobin: 12.5 g/dL — ABNORMAL LOW (ref 13.0–17.0)
Hemoglobin: 12.5 g/dL — ABNORMAL LOW (ref 13.0–17.0)
Hemoglobin: 13.2 g/dL (ref 13.0–17.0)
Hemoglobin: 13.5 g/dL (ref 13.0–17.0)
Hemoglobin: 13.7 g/dL (ref 13.0–17.0)
Hemoglobin: 13.8 g/dL (ref 13.0–17.0)
MCH: 32.2 pg (ref 26.0–34.0)
MCH: 32.2 pg (ref 26.0–34.0)
MCH: 32.3 pg (ref 26.0–34.0)
MCH: 32.5 pg (ref 26.0–34.0)
MCH: 32.6 pg (ref 26.0–34.0)
MCH: 32.7 pg (ref 26.0–34.0)
MCH: 32.8 pg (ref 26.0–34.0)
MCH: 33 pg (ref 26.0–34.0)
MCH: 33.1 pg (ref 26.0–34.0)
MCH: 33.2 pg (ref 26.0–34.0)
MCH: 33.5 pg (ref 26.0–34.0)
MCH: 33.7 pg (ref 26.0–34.0)
MCH: 33.7 pg (ref 26.0–34.0)
MCHC: 33.5 g/dL (ref 30.0–36.0)
MCHC: 33.7 g/dL (ref 30.0–36.0)
MCHC: 33.7 g/dL (ref 30.0–36.0)
MCHC: 33.9 g/dL (ref 30.0–36.0)
MCHC: 34 g/dL (ref 30.0–36.0)
MCHC: 34 g/dL (ref 30.0–36.0)
MCHC: 34.4 g/dL (ref 30.0–36.0)
MCHC: 34.4 g/dL (ref 30.0–36.0)
MCHC: 34.5 g/dL (ref 30.0–36.0)
MCHC: 34.8 g/dL (ref 30.0–36.0)
MCHC: 35 g/dL (ref 30.0–36.0)
MCHC: 35.3 g/dL (ref 30.0–36.0)
MCHC: 35.8 g/dL (ref 30.0–36.0)
MCV: 93.5 fL (ref 78.0–100.0)
MCV: 94.2 fL (ref 78.0–100.0)
MCV: 94.2 fL (ref 78.0–100.0)
MCV: 94.7 fL (ref 78.0–100.0)
MCV: 95.2 fL (ref 78.0–100.0)
MCV: 95.8 fL (ref 78.0–100.0)
MCV: 96 fL (ref 78.0–100.0)
MCV: 96.3 fL (ref 78.0–100.0)
MCV: 96.3 fL (ref 78.0–100.0)
MCV: 96.6 fL (ref 78.0–100.0)
MCV: 96.8 fL (ref 78.0–100.0)
MCV: 97 fL (ref 78.0–100.0)
MCV: 97.3 fL (ref 78.0–100.0)
Platelets: 112 10*3/uL — ABNORMAL LOW (ref 150–400)
Platelets: 119 10*3/uL — ABNORMAL LOW (ref 150–400)
Platelets: 137 10*3/uL — ABNORMAL LOW (ref 150–400)
Platelets: 137 10*3/uL — ABNORMAL LOW (ref 150–400)
Platelets: 141 10*3/uL — ABNORMAL LOW (ref 150–400)
Platelets: 142 10*3/uL — ABNORMAL LOW (ref 150–400)
Platelets: 153 10*3/uL (ref 150–400)
Platelets: 158 10*3/uL (ref 150–400)
Platelets: 191 10*3/uL (ref 150–400)
Platelets: 209 10*3/uL (ref 150–400)
Platelets: 230 10*3/uL (ref 150–400)
Platelets: 57 10*3/uL — ABNORMAL LOW (ref 150–400)
Platelets: 99 10*3/uL — ABNORMAL LOW (ref 150–400)
RBC: 3.36 MIL/uL — ABNORMAL LOW (ref 4.22–5.81)
RBC: 3.43 MIL/uL — ABNORMAL LOW (ref 4.22–5.81)
RBC: 3.56 MIL/uL — ABNORMAL LOW (ref 4.22–5.81)
RBC: 3.59 MIL/uL — ABNORMAL LOW (ref 4.22–5.81)
RBC: 3.69 MIL/uL — ABNORMAL LOW (ref 4.22–5.81)
RBC: 3.78 MIL/uL — ABNORMAL LOW (ref 4.22–5.81)
RBC: 3.79 MIL/uL — ABNORMAL LOW (ref 4.22–5.81)
RBC: 3.81 MIL/uL — ABNORMAL LOW (ref 4.22–5.81)
RBC: 3.83 MIL/uL — ABNORMAL LOW (ref 4.22–5.81)
RBC: 4 MIL/uL — ABNORMAL LOW (ref 4.22–5.81)
RBC: 4.06 MIL/uL — ABNORMAL LOW (ref 4.22–5.81)
RBC: 4.12 MIL/uL — ABNORMAL LOW (ref 4.22–5.81)
RBC: 4.19 MIL/uL — ABNORMAL LOW (ref 4.22–5.81)
RDW: 11.9 % (ref 11.5–15.5)
RDW: 12 % (ref 11.5–15.5)
RDW: 12 % (ref 11.5–15.5)
RDW: 12.1 % (ref 11.5–15.5)
RDW: 12.2 % (ref 11.5–15.5)
RDW: 12.2 % (ref 11.5–15.5)
RDW: 12.3 % (ref 11.5–15.5)
RDW: 12.3 % (ref 11.5–15.5)
RDW: 12.3 % (ref 11.5–15.5)
RDW: 12.4 % (ref 11.5–15.5)
RDW: 12.4 % (ref 11.5–15.5)
RDW: 12.4 % (ref 11.5–15.5)
RDW: 12.6 % (ref 11.5–15.5)
WBC: 10.3 10*3/uL (ref 4.0–10.5)
WBC: 11.3 10*3/uL — ABNORMAL HIGH (ref 4.0–10.5)
WBC: 3.3 10*3/uL — ABNORMAL LOW (ref 4.0–10.5)
WBC: 6.2 10*3/uL (ref 4.0–10.5)
WBC: 6.4 10*3/uL (ref 4.0–10.5)
WBC: 7.1 10*3/uL (ref 4.0–10.5)
WBC: 7.3 10*3/uL (ref 4.0–10.5)
WBC: 7.5 10*3/uL (ref 4.0–10.5)
WBC: 7.6 10*3/uL (ref 4.0–10.5)
WBC: 7.8 10*3/uL (ref 4.0–10.5)
WBC: 7.9 10*3/uL (ref 4.0–10.5)
WBC: 8 10*3/uL (ref 4.0–10.5)
WBC: 8.8 10*3/uL (ref 4.0–10.5)

## 2011-03-12 LAB — BLOOD GAS, ARTERIAL
Acid-base deficit: 5.9 mmol/L — ABNORMAL HIGH (ref 0.0–2.0)
Acid-base deficit: 6.2 mmol/L — ABNORMAL HIGH (ref 0.0–2.0)
Acid-base deficit: 7.2 mmol/L — ABNORMAL HIGH (ref 0.0–2.0)
Acid-base deficit: 7.7 mmol/L — ABNORMAL HIGH (ref 0.0–2.0)
Acid-base deficit: 7.9 mmol/L — ABNORMAL HIGH (ref 0.0–2.0)
Acid-base deficit: 8 mmol/L — ABNORMAL HIGH (ref 0.0–2.0)
Bicarbonate: 16.1 mEq/L — ABNORMAL LOW (ref 20.0–24.0)
Bicarbonate: 17.6 mEq/L — ABNORMAL LOW (ref 20.0–24.0)
Bicarbonate: 17.7 mEq/L — ABNORMAL LOW (ref 20.0–24.0)
Bicarbonate: 17.7 mEq/L — ABNORMAL LOW (ref 20.0–24.0)
Bicarbonate: 17.8 mEq/L — ABNORMAL LOW (ref 20.0–24.0)
Bicarbonate: 18.1 mEq/L — ABNORMAL LOW (ref 20.0–24.0)
Drawn by: 29165
Drawn by: 311599
Drawn by: 31276
FIO2: 0.4 %
FIO2: 0.4 %
FIO2: 0.4 %
FIO2: 0.4 %
FIO2: 0.4 %
FIO2: 1 %
MECHVT: 500 mL
MECHVT: 500 mL
MECHVT: 500 mL
MECHVT: 500 mL
MECHVT: 600 mL
MECHVT: 600 mL
O2 Saturation: 98.8 %
O2 Saturation: 99.1 %
O2 Saturation: 99.4 %
O2 Saturation: 99.4 %
O2 Saturation: 99.4 %
O2 Saturation: 99.7 %
PEEP: 5 cmH2O
PEEP: 5 cmH2O
PEEP: 5 cmH2O
PEEP: 5 cmH2O
PEEP: 5 cmH2O
PEEP: 5 cmH2O
Patient temperature: 91.4
Patient temperature: 91.4
Patient temperature: 98.6
Patient temperature: 98.6
Patient temperature: 98.6
Patient temperature: 98.6
RATE: 16 resp/min
RATE: 16 resp/min
RATE: 16 resp/min
RATE: 20 resp/min
RATE: 20 resp/min
RATE: 20 resp/min
TCO2: 16.9 mmol/L (ref 0–100)
TCO2: 18.6 mmol/L (ref 0–100)
TCO2: 18.6 mmol/L (ref 0–100)
TCO2: 18.9 mmol/L (ref 0–100)
TCO2: 19 mmol/L (ref 0–100)
TCO2: 19.1 mmol/L (ref 0–100)
pCO2 arterial: 22.7 mmHg — ABNORMAL LOW (ref 35.0–45.0)
pCO2 arterial: 27.9 mmHg — ABNORMAL LOW (ref 35.0–45.0)
pCO2 arterial: 28.4 mmHg — ABNORMAL LOW (ref 35.0–45.0)
pCO2 arterial: 32.2 mmHg — ABNORMAL LOW (ref 35.0–45.0)
pCO2 arterial: 39.4 mmHg (ref 35.0–45.0)
pCO2 arterial: 39.6 mmHg (ref 35.0–45.0)
pH, Arterial: 7.273 — ABNORMAL LOW (ref 7.350–7.450)
pH, Arterial: 7.277 — ABNORMAL LOW (ref 7.350–7.450)
pH, Arterial: 7.368 (ref 7.350–7.450)
pH, Arterial: 7.38 (ref 7.350–7.450)
pH, Arterial: 7.385 (ref 7.350–7.450)
pH, Arterial: 7.484 — ABNORMAL HIGH (ref 7.350–7.450)
pO2, Arterial: 165 mmHg — ABNORMAL HIGH (ref 80.0–100.0)
pO2, Arterial: 171 mmHg — ABNORMAL HIGH (ref 80.0–100.0)
pO2, Arterial: 173 mmHg — ABNORMAL HIGH (ref 80.0–100.0)
pO2, Arterial: 174 mmHg — ABNORMAL HIGH (ref 80.0–100.0)
pO2, Arterial: 191 mmHg — ABNORMAL HIGH (ref 80.0–100.0)
pO2, Arterial: 461 mmHg — ABNORMAL HIGH (ref 80.0–100.0)

## 2011-03-12 LAB — URINALYSIS, ROUTINE W REFLEX MICROSCOPIC
Bilirubin Urine: NEGATIVE
Glucose, UA: NEGATIVE mg/dL
Ketones, ur: NEGATIVE mg/dL
Leukocytes, UA: NEGATIVE
Nitrite: NEGATIVE
Protein, ur: 100 mg/dL — AB
Specific Gravity, Urine: 1.008 (ref 1.005–1.030)
Urobilinogen, UA: 0.2 mg/dL (ref 0.0–1.0)
pH: 6.5 (ref 5.0–8.0)

## 2011-03-12 LAB — CARDIAC PANEL(CRET KIN+CKTOT+MB+TROPI)
CK, MB: 15.1 ng/mL (ref 0.3–4.0)
CK, MB: 17 ng/mL (ref 0.3–4.0)
CK, MB: 23.9 ng/mL (ref 0.3–4.0)
Relative Index: 5 — ABNORMAL HIGH (ref 0.0–2.5)
Relative Index: 5.8 — ABNORMAL HIGH (ref 0.0–2.5)
Relative Index: 7.2 — ABNORMAL HIGH (ref 0.0–2.5)
Total CK: 291 U/L — ABNORMAL HIGH (ref 7–232)
Total CK: 302 U/L — ABNORMAL HIGH (ref 7–232)
Total CK: 333 U/L — ABNORMAL HIGH (ref 7–232)
Troponin I: 0.61 ng/mL (ref 0.00–0.06)
Troponin I: 0.61 ng/mL (ref 0.00–0.06)
Troponin I: 0.69 ng/mL (ref 0.00–0.06)

## 2011-03-12 LAB — COMPREHENSIVE METABOLIC PANEL
ALT: 80 U/L — ABNORMAL HIGH (ref 0–53)
AST: 97 U/L — ABNORMAL HIGH (ref 0–37)
Albumin: 3.1 g/dL — ABNORMAL LOW (ref 3.5–5.2)
Alkaline Phosphatase: 45 U/L (ref 39–117)
BUN: 6 mg/dL (ref 6–23)
CO2: 20 mEq/L (ref 19–32)
Calcium: 8.1 mg/dL — ABNORMAL LOW (ref 8.4–10.5)
Chloride: 112 mEq/L (ref 96–112)
Creatinine, Ser: 0.95 mg/dL (ref 0.4–1.5)
GFR calc Af Amer: >60 mL/min (ref >60)
GFR calc non Af Amer: >60 mL/min (ref >60)
Glucose, Bld: 124 mg/dL — ABNORMAL HIGH (ref 70–99)
Potassium: 3.7 mEq/L (ref 3.5–5.1)
Sodium: 140 mEq/L (ref 135–145)
Total Bilirubin: 0.8 mg/dL (ref 0.3–1.2)
Total Protein: 5.5 g/dL — ABNORMAL LOW (ref 6.0–8.3)

## 2011-03-12 LAB — URINE MICROSCOPIC-ADD ON

## 2011-03-12 LAB — HEPARIN INDUCED THROMBOCYTOPENIA PNL
Heparin Induced Plt Ab: NEGATIVE
Patient O.D.: 0.131
UFH High Dose UFH H: 0 % Release
UFH Low Dose 0.1 IU/mL: 0 % Release
UFH Low Dose 0.5 IU/mL: 0 % Release
UFH SRA Result: NEGATIVE

## 2011-03-12 LAB — HEPARIN LEVEL (UNFRACTIONATED)
Heparin Unfractionated: 0.18 IU/mL — ABNORMAL LOW (ref 0.30–0.70)
Heparin Unfractionated: 0.19 IU/mL — ABNORMAL LOW (ref 0.30–0.70)
Heparin Unfractionated: 0.28 IU/mL — ABNORMAL LOW (ref 0.30–0.70)
Heparin Unfractionated: 0.32 IU/mL (ref 0.30–0.70)
Heparin Unfractionated: 0.33 IU/mL (ref 0.30–0.70)
Heparin Unfractionated: 0.38 IU/mL (ref 0.30–0.70)
Heparin Unfractionated: 0.45 IU/mL (ref 0.30–0.70)

## 2011-03-12 LAB — BRAIN NATRIURETIC PEPTIDE
Pro B Natriuretic peptide (BNP): 316 pg/mL — ABNORMAL HIGH (ref 0.0–100.0)
Pro B Natriuretic peptide (BNP): 37 pg/mL (ref 0.0–100.0)

## 2011-03-12 LAB — PHOSPHORUS
Phosphorus: 2.1 mg/dL — ABNORMAL LOW (ref 2.3–4.6)
Phosphorus: 2.9 mg/dL (ref 2.3–4.6)
Phosphorus: 3 mg/dL (ref 2.3–4.6)
Phosphorus: 4.1 mg/dL (ref 2.3–4.6)
Phosphorus: 4.3 mg/dL (ref 2.3–4.6)
Phosphorus: 4.5 mg/dL (ref 2.3–4.6)
Phosphorus: 5.2 mg/dL — ABNORMAL HIGH (ref 2.3–4.6)

## 2011-03-12 LAB — PROTIME-INR
INR: 0.95 (ref 0.00–1.49)
INR: 1.06 (ref 0.00–1.49)
INR: 1.07 (ref 0.00–1.49)
INR: 1.11 (ref 0.00–1.49)
Prothrombin Time: 12.9 seconds (ref 11.6–15.2)
Prothrombin Time: 14 seconds (ref 11.6–15.2)
Prothrombin Time: 14.1 seconds (ref 11.6–15.2)
Prothrombin Time: 14.5 seconds (ref 11.6–15.2)

## 2011-03-12 LAB — MAGNESIUM
Magnesium: 1.4 mg/dL — ABNORMAL LOW (ref 1.5–2.5)
Magnesium: 1.4 mg/dL — ABNORMAL LOW (ref 1.5–2.5)
Magnesium: 1.5 mg/dL (ref 1.5–2.5)
Magnesium: 1.5 mg/dL (ref 1.5–2.5)
Magnesium: 1.5 mg/dL (ref 1.5–2.5)
Magnesium: 1.6 mg/dL (ref 1.5–2.5)
Magnesium: 1.7 mg/dL (ref 1.5–2.5)

## 2011-03-12 LAB — POCT CARDIAC MARKERS
CKMB, poc: 1.2 ng/mL (ref 1.0–8.0)
Myoglobin, poc: 191 ng/mL (ref 12–200)
Troponin i, poc: <0.05 ng/mL (ref 0.00–0.09)

## 2011-03-12 LAB — POCT I-STAT, CHEM 8
BUN: 6 mg/dL (ref 6–23)
Calcium, Ion: 1.06 mmol/L — ABNORMAL LOW (ref 1.12–1.32)
Chloride: 108 mEq/L (ref 96–112)
Creatinine, Ser: 0.8 mg/dL (ref 0.4–1.5)
Glucose, Bld: 123 mg/dL — ABNORMAL HIGH (ref 70–99)
HCT: 41 % (ref 39.0–52.0)
Hemoglobin: 13.9 g/dL (ref 13.0–17.0)
Potassium: 3.7 mEq/L (ref 3.5–5.1)
Sodium: 140 mEq/L (ref 135–145)
TCO2: 19 mmol/L (ref 0–100)

## 2011-03-12 LAB — APTT
aPTT: 30 seconds (ref 24–37)
aPTT: 32 seconds (ref 24–37)
aPTT: 32 seconds (ref 24–37)
aPTT: 35 seconds (ref 24–37)

## 2011-03-12 LAB — CK TOTAL AND CKMB (NOT AT ARMC)
CK, MB: 2.4 ng/mL (ref 0.3–4.0)
CK, MB: 30.1 ng/mL (ref 0.3–4.0)
Relative Index: 5.5 — ABNORMAL HIGH (ref 0.0–2.5)
Relative Index: INVALID (ref 0.0–2.5)
Total CK: 551 U/L — ABNORMAL HIGH (ref 7–232)
Total CK: 92 U/L (ref 7–232)

## 2011-03-12 LAB — TROPONIN I: Troponin I: 0.06 ng/mL (ref 0.00–0.06)

## 2011-03-12 LAB — MRSA PCR SCREENING: MRSA by PCR: NEGATIVE

## 2011-03-12 LAB — RAPID URINE DRUG SCREEN, HOSP PERFORMED
Amphetamines: NOT DETECTED
Barbiturates: NOT DETECTED
Benzodiazepines: POSITIVE — AB
Cocaine: NOT DETECTED
Opiates: NOT DETECTED
Tetrahydrocannabinol: NOT DETECTED

## 2011-03-12 LAB — LACTIC ACID, PLASMA
Lactic Acid, Venous: 1.5 mmol/L (ref 0.5–2.2)
Lactic Acid, Venous: 5.5 mmol/L — ABNORMAL HIGH (ref 0.5–2.2)

## 2011-03-12 LAB — ETHANOL: Alcohol, Ethyl (B): <5 mg/dL (ref 0–10)

## 2011-03-17 NOTE — Procedures (Signed)
Summary: icd check/st. jude   Current Medications (verified): 1)  Aspirin 325 Mg Tabs (Aspirin) .... Take 1 Tablet By Mouth Once Daily 2)  Diltiazem Hcl 90 Mg Tabs (Diltiazem Hcl) .... Take One-Half  Tablet Three Times A Day 3)  Ranitidine Hcl 150 Mg Tabs (Ranitidine Hcl) .... Take One Tablet Two Times A Day 4)  Citalopram Hydrobromide 20 Mg Tabs (Citalopram Hydrobromide) .... Take One Tablet Daily 5)  Synthroid 25 Mcg Tabs (Levothyroxine Sodium) .... Once Daily 6)  Vitamin C 500 Mg  Tabs (Ascorbic Acid) .... Once Daily  Allergies (verified): 1)  ! * Smallpox   ICD Specifications Following MD:  Hillis Range, MD     ICD Vendor:  St Jude     ICD Model Number:  [id number]     ICD Serial Number:  [id number] ICD DOI:  09/08/2010     ICD Implanting MD:  Hillis Range, MD  Lead 1:    Location: [facility]     DOI: 09/08/2010     Model #: [id number]     Serial #: [id number]     Status: active  Indications::  VF arrest   Huston Foley Parameters Mode VVI     Lower Rate Limit:  40      Tachy Zones VF:  222     VT:  176     Tech Comments:  see paceart report.

## 2011-04-14 ENCOUNTER — Telehealth: Payer: Self-pay | Admitting: Cardiovascular Disease

## 2011-04-14 NOTE — Telephone Encounter (Signed)
Pt picked up records ROI on File

## 2011-05-05 ENCOUNTER — Telehealth: Payer: Self-pay | Admitting: Cardiovascular Disease

## 2011-05-05 NOTE — Telephone Encounter (Signed)
Pt mother states pt needs citalopram 20mg  once a day to be called in to [facility]/[address] #[phone] Rx# [id number].

## 2011-05-05 NOTE — Telephone Encounter (Signed)
I spoke with the pt and he normally gets this medication filled through [facility].  The pt said that [facility] has been going back and forth with the pharmacy.  The pt did go to the pharmacy about an hour ago and [facility] had sent in his prescription for citalopram.

## 2011-05-05 NOTE — Telephone Encounter (Signed)
Pt's mom calling re pt being denied twice for disability and wants dr cooper to fax a letter in support of him being on disability, fax to [phone] att janelle tharp with a copy being mailed to pt

## 2011-05-06 NOTE — Telephone Encounter (Signed)
I spoke with the patient on 05/05/11 and made him aware that I would forward this information to Dr Excell Seltzer.

## 2011-05-13 ENCOUNTER — Encounter: Payer: Self-pay | Admitting: Cardiovascular Disease

## 2011-05-13 NOTE — Telephone Encounter (Signed)
Dr Excell Seltzer completed letter.  I called the pt and left a message to make him aware that this would be placed in the out going mail to be sent to his home.

## 2011-05-27 ENCOUNTER — Encounter: Payer: Self-pay | Admitting: Cardiovascular Disease

## 2011-06-10 ENCOUNTER — Encounter: Payer: Self-pay | Admitting: Cardiovascular Disease

## 2011-06-10 ENCOUNTER — Ambulatory Visit (INDEPENDENT_AMBULATORY_CARE_PROVIDER_SITE_OTHER): Payer: Self-pay | Admitting: *Deleted

## 2011-06-10 ENCOUNTER — Ambulatory Visit (INDEPENDENT_AMBULATORY_CARE_PROVIDER_SITE_OTHER): Payer: Self-pay | Admitting: Cardiovascular Disease

## 2011-06-10 VITALS — BP 114/70 | HR 62 | Resp 18 | Ht 75.0 in | Wt 181.0 lb

## 2011-06-10 DIAGNOSIS — I201 Angina pectoris with documented spasm: Secondary | ICD-10-CM

## 2011-06-10 DIAGNOSIS — I4901 Ventricular fibrillation: Secondary | ICD-10-CM

## 2011-06-10 DIAGNOSIS — I2510 Atherosclerotic heart disease of native coronary artery without angina pectoris: Secondary | ICD-10-CM

## 2011-06-10 NOTE — Progress Notes (Signed)
HPI:  This is a 45 year old gentleman with history of coronary vasospasm and out of hospital ventricular fibrillation cardiac arrest. The patient presents for followup evaluation today. He continues to have episodic chest pain but does become less frequent. His symptoms are worse at night. The chest pain is in the substernal region and does not radiate. He denies associated dyspnea, palpitations, or syncope. He has had no ICD discharges. He has been compliant with his medications. He is still trying to get disability because of his cardiac problems.  Outpatient Encounter Prescriptions as of 06/10/2011  Medication Sig Dispense Refill  . aspirin 325 MG tablet Take 325 mg by mouth daily.        . citalopram (CELEXA) 20 MG tablet Take 20 mg by mouth daily.        Marland Kitchen diltiazem (CARDIZEM) 90 MG tablet Take by mouth 3 (three) times daily. Take 1/2 tablet 3 times daily      . isosorbide mononitrate (IMDUR) 30 MG 24 hr tablet Take 15 mg by mouth at bedtime.        Marland Kitchen levothyroxine (SYNTHROID, LEVOTHROID) 25 MCG tablet Take 25 mcg by mouth daily.        . pantoprazole (PROTONIX) 40 MG tablet Take 40 mg by mouth daily.        . vitamin C (ASCORBIC ACID) 500 MG tablet Take 500 mg by mouth daily.          Allergies  Allergen Reactions  . Penicillins   . Small Pox Vaccine     Past Medical History  Diagnosis Date  . CAD (coronary artery disease)     nonobstructive  . VF (ventricular fibrillation)     arrest  . 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 (doesn't say where LV goes to)   . 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 ICD (implantable cardiac defibrillator) in place     s/p ICD    ROS: Negative except as per HPI  BP 114/70  Pulse 62  Resp 18  Ht 6\' 3"  (1.905 m)  Wt 181 lb (82.101 kg)  BMI 22.62 kg/m2  PHYSICAL EXAM: Pt is alert and oriented, NAD HEENT: normal Neck: JVP - normal, carotids 2+= without bruits Lungs: CTA bilaterally CV: RRR without murmur or  gallop Abd: soft, NT, Positive BS, no hepatomegaly Ext: no C/C/E, distal pulses intact and equal Skin: warm/dry no rash  EKG:  Normal sinus rhythm 62 beats per minute, within normal limits.  ASSESSMENT AND PLAN:

## 2011-06-10 NOTE — Patient Instructions (Signed)
Your physician wants you to follow-up in: [facility] 2013. You will receive a reminder letter in the mail two months in advance. If you don't receive a letter, please call our office to schedule the follow-up appointment.  Your physician recommends that you continue on your current medications as directed. Please refer to the Current Medication list given to you today.

## 2011-06-10 NOTE — Assessment & Plan Note (Signed)
The patient is stable with less frequent angina than in the past. We'll continue his current medications with the use of both diltiazem and isosorbide.

## 2011-06-10 NOTE — Assessment & Plan Note (Signed)
Likely etiology was coronary vasospasm and profound ischemia. He has had no recurrent ventricular tachycardia or fibrillation and has an ICD in place.

## 2011-09-17 ENCOUNTER — Ambulatory Visit (INDEPENDENT_AMBULATORY_CARE_PROVIDER_SITE_OTHER): Payer: Self-pay | Admitting: *Deleted

## 2011-09-17 ENCOUNTER — Encounter: Payer: Self-pay | Admitting: Internal Medicine

## 2011-09-17 DIAGNOSIS — I4901 Ventricular fibrillation: Secondary | ICD-10-CM

## 2011-09-17 LAB — ICD DEVICE OBSERVATION
BRDY-0002RV: 40 {beats}/min
DEV-0020ICD: NEGATIVE
DEVICE MODEL ICD: 618386
FVT: 0
PACEART VT: 0
RV LEAD AMPLITUDE: 10.9 mv
RV LEAD IMPEDENCE ICD: 325 Ohm
RV LEAD THRESHOLD: 1 V
TOT-0007: 2
TOT-0008: 0
TOT-0009: 1
TOT-0010: 5
TZON-0003SLOWVT: 340 ms
TZON-0004SLOWVT: 20
TZON-0005SLOWVT: 6
TZON-0010SLOWVT: 80 ms
VENTRICULAR PACING ICD: 0 pct
VF: 0

## 2011-09-22 ENCOUNTER — Encounter: Payer: Self-pay | Admitting: *Deleted

## 2011-09-27 IMAGING — CT CT HEAD W/O CM
1 of 2 series · 13 of 30 positions shown, 17 images · non-contrast
Comparison: None

CLINICAL DATA: Myocardial infarction.  Status post CPR.

CT HEAD WITHOUT CONTRAST
TECHNIQUE: Contiguous axial images were obtained from the base of
the skull through the vertex without contrast.

[Series 2: brain · axial · 0.47mm/px · z∈[+150,+273]mm · 13 of 28 slices shown, 17 images]
[im 2/28  brain]
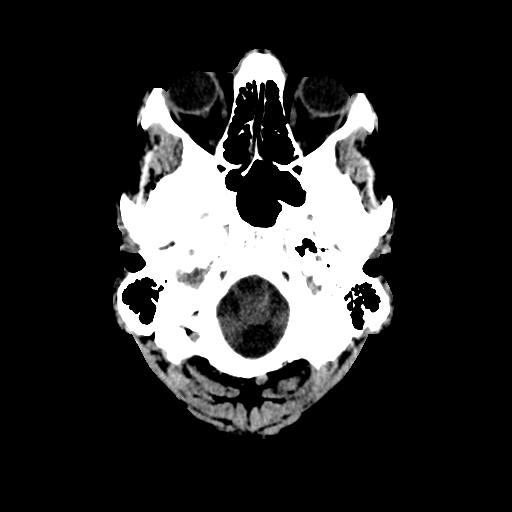
[im 2/28  bone]
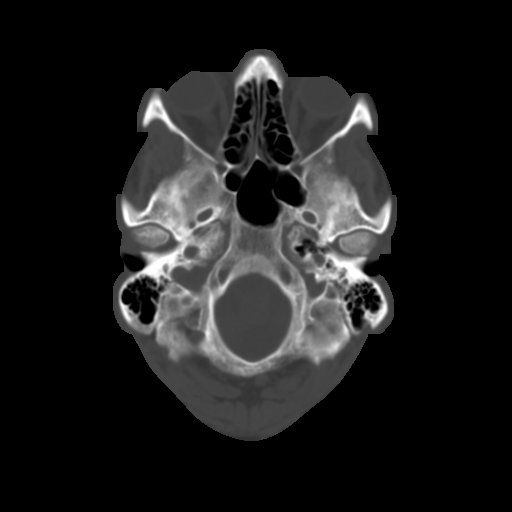
[im 4/28  brain]
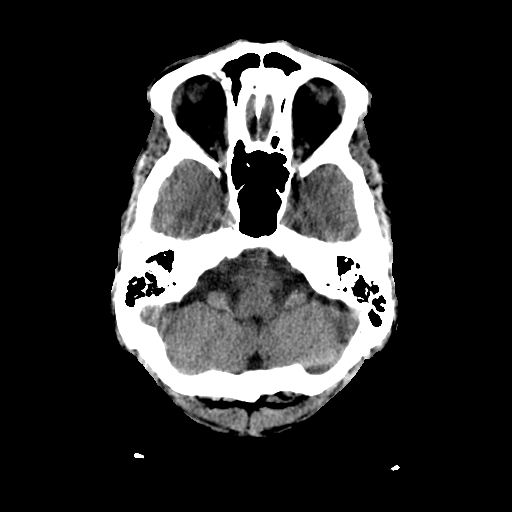
[im 6/28  brain]
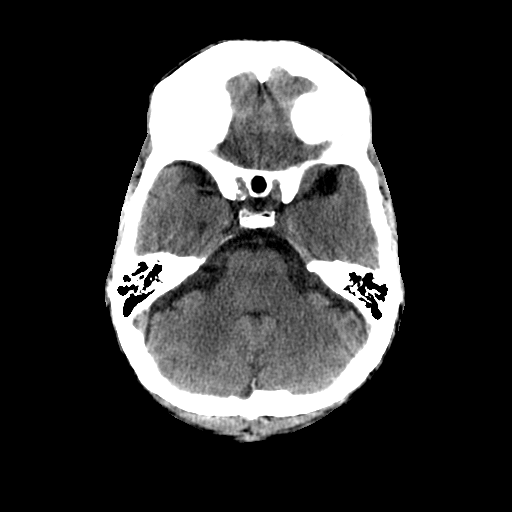
[im 8/28  brain]
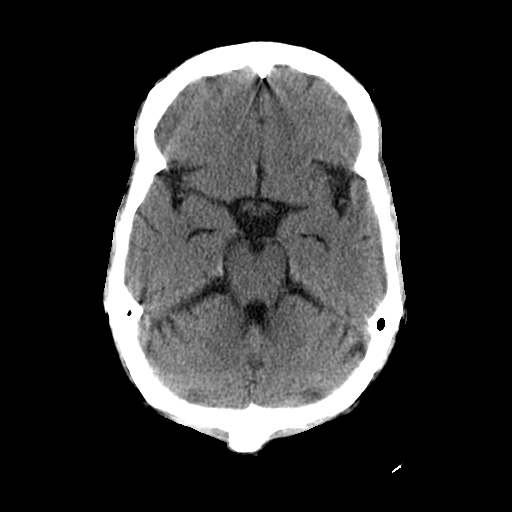
[im 10/28  brain]
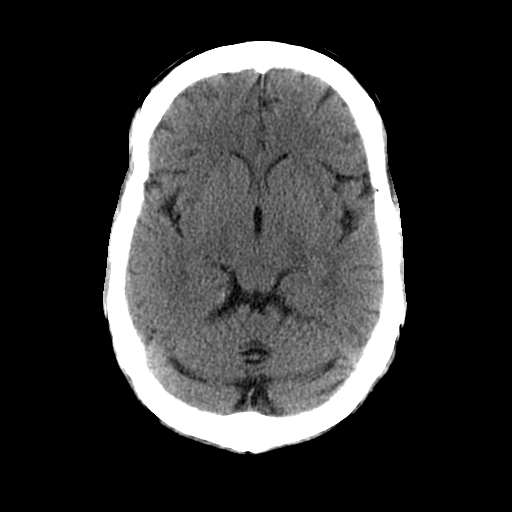
[im 10/28  bone]
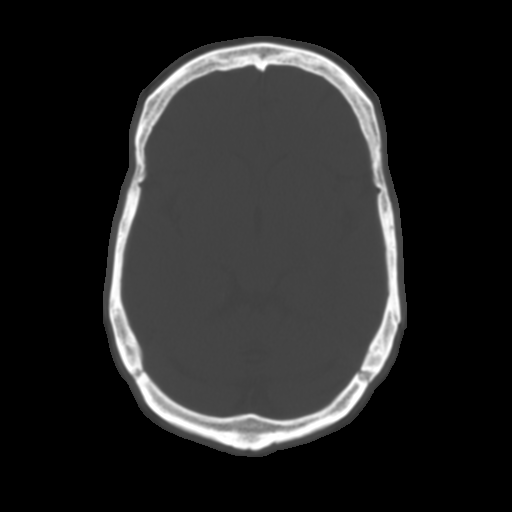
[im 12/28  brain]
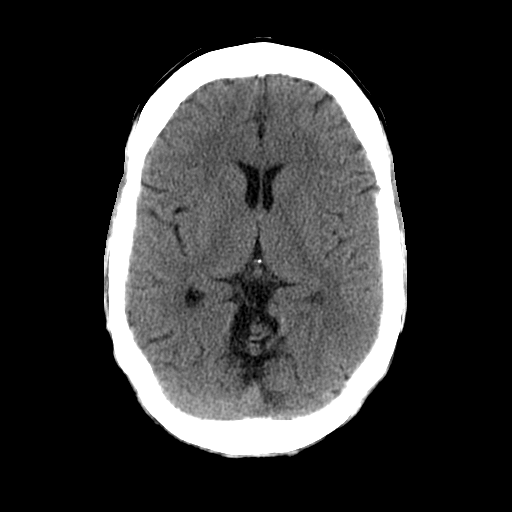
[im 14/28  brain]
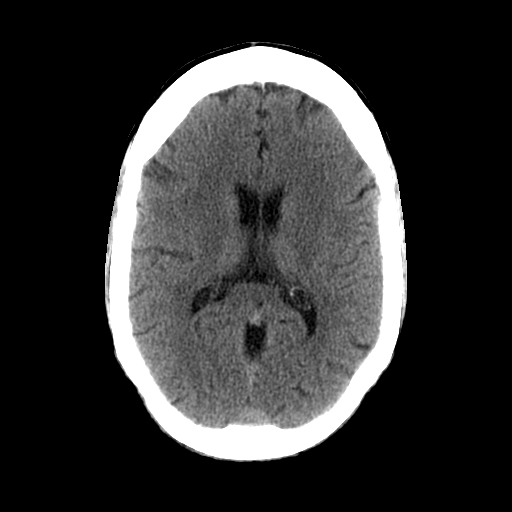
[im 16/28  brain]
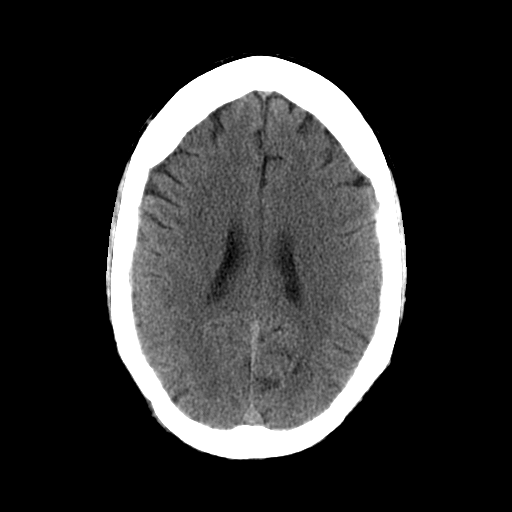
[im 18/28  brain]
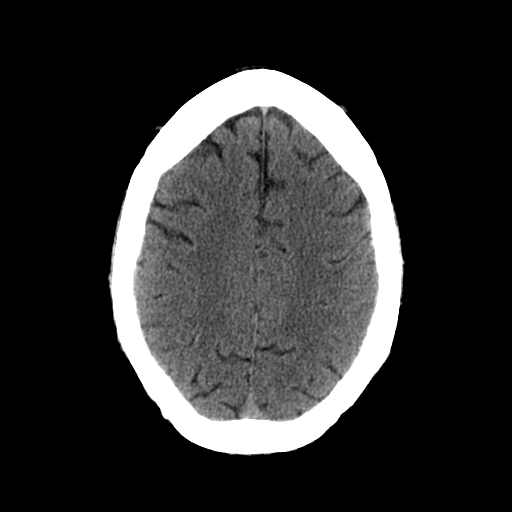
[im 18/28  bone]
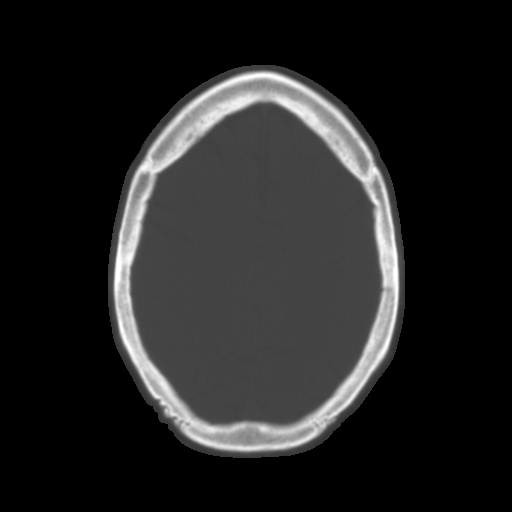
[im 20/28  brain]
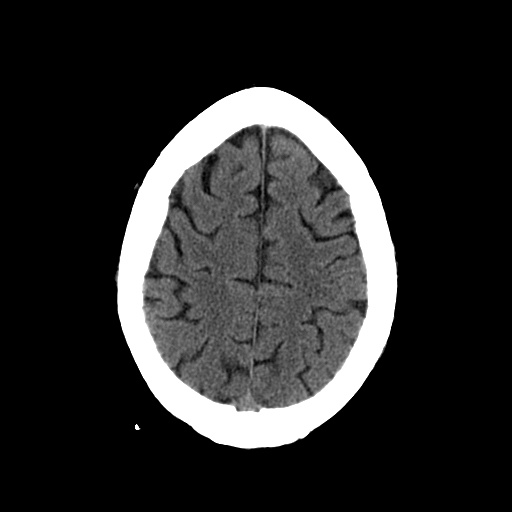
[im 22/28  brain]
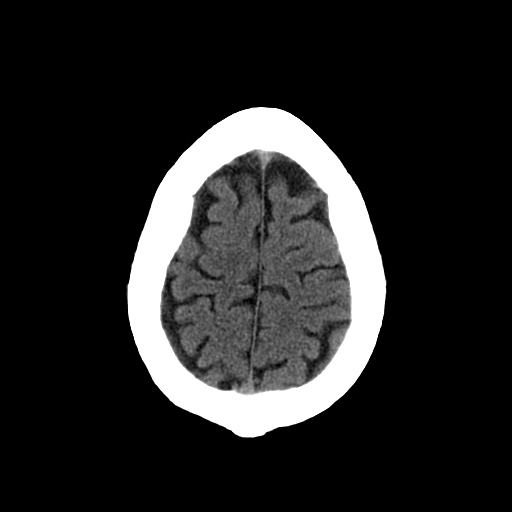
[im 24/28  brain]
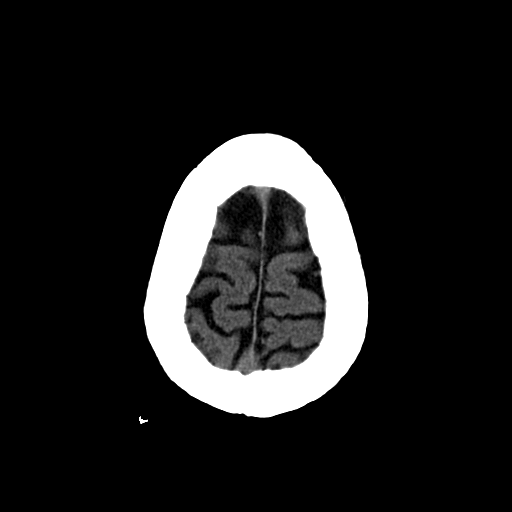
[im 26/28  brain]
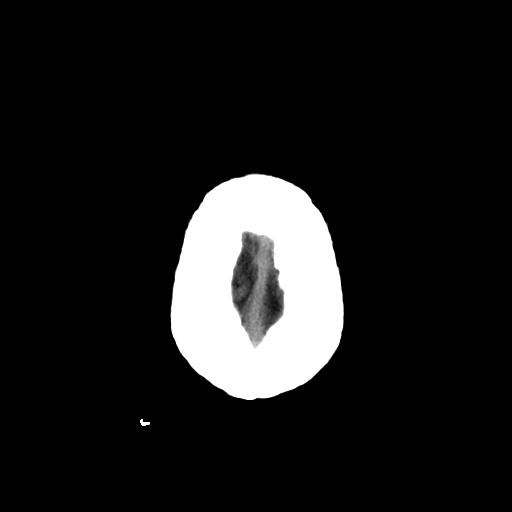
[im 26/28  bone]
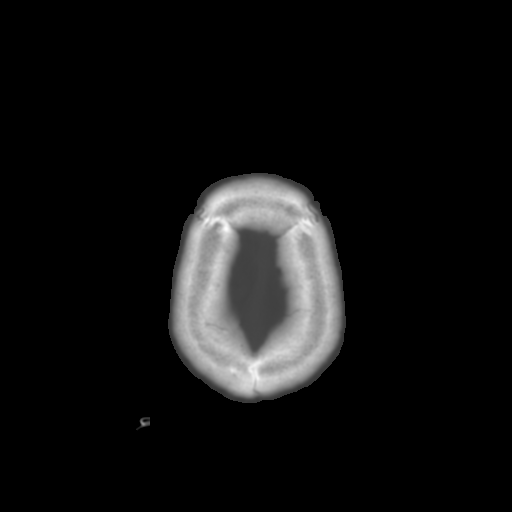

[13 of 30 positions shown; findings below may reference images not displayed]

FINDINGS: The ventricles are normal.  No extra-axial fluid
collections are seen.  The brainstem and cerebellum are
unremarkable.  No acute intracranial findings such as infarction or
hemorrhage.  No mass lesions.

The bony calvarium is intact.  The visualized paranasal sinuses and
mastoid air cells are clear.
IMPRESSION: No acute intracranial findings or mass lesion.

## 2011-09-27 IMAGING — CR DG CHEST 1V PORT
1 series · 1 of 1 positions shown · non-contrast
Comparison: None

Addendum Begins

The patient has a left-sided subclavian central venous line.  The
tip overlies the SVC - right atrial junction.
Addendum Ends
CLINICAL DATA: Myocardial infarction.  Status post CPR.
PORTABLE CHEST - 1 VIEW

[AP]
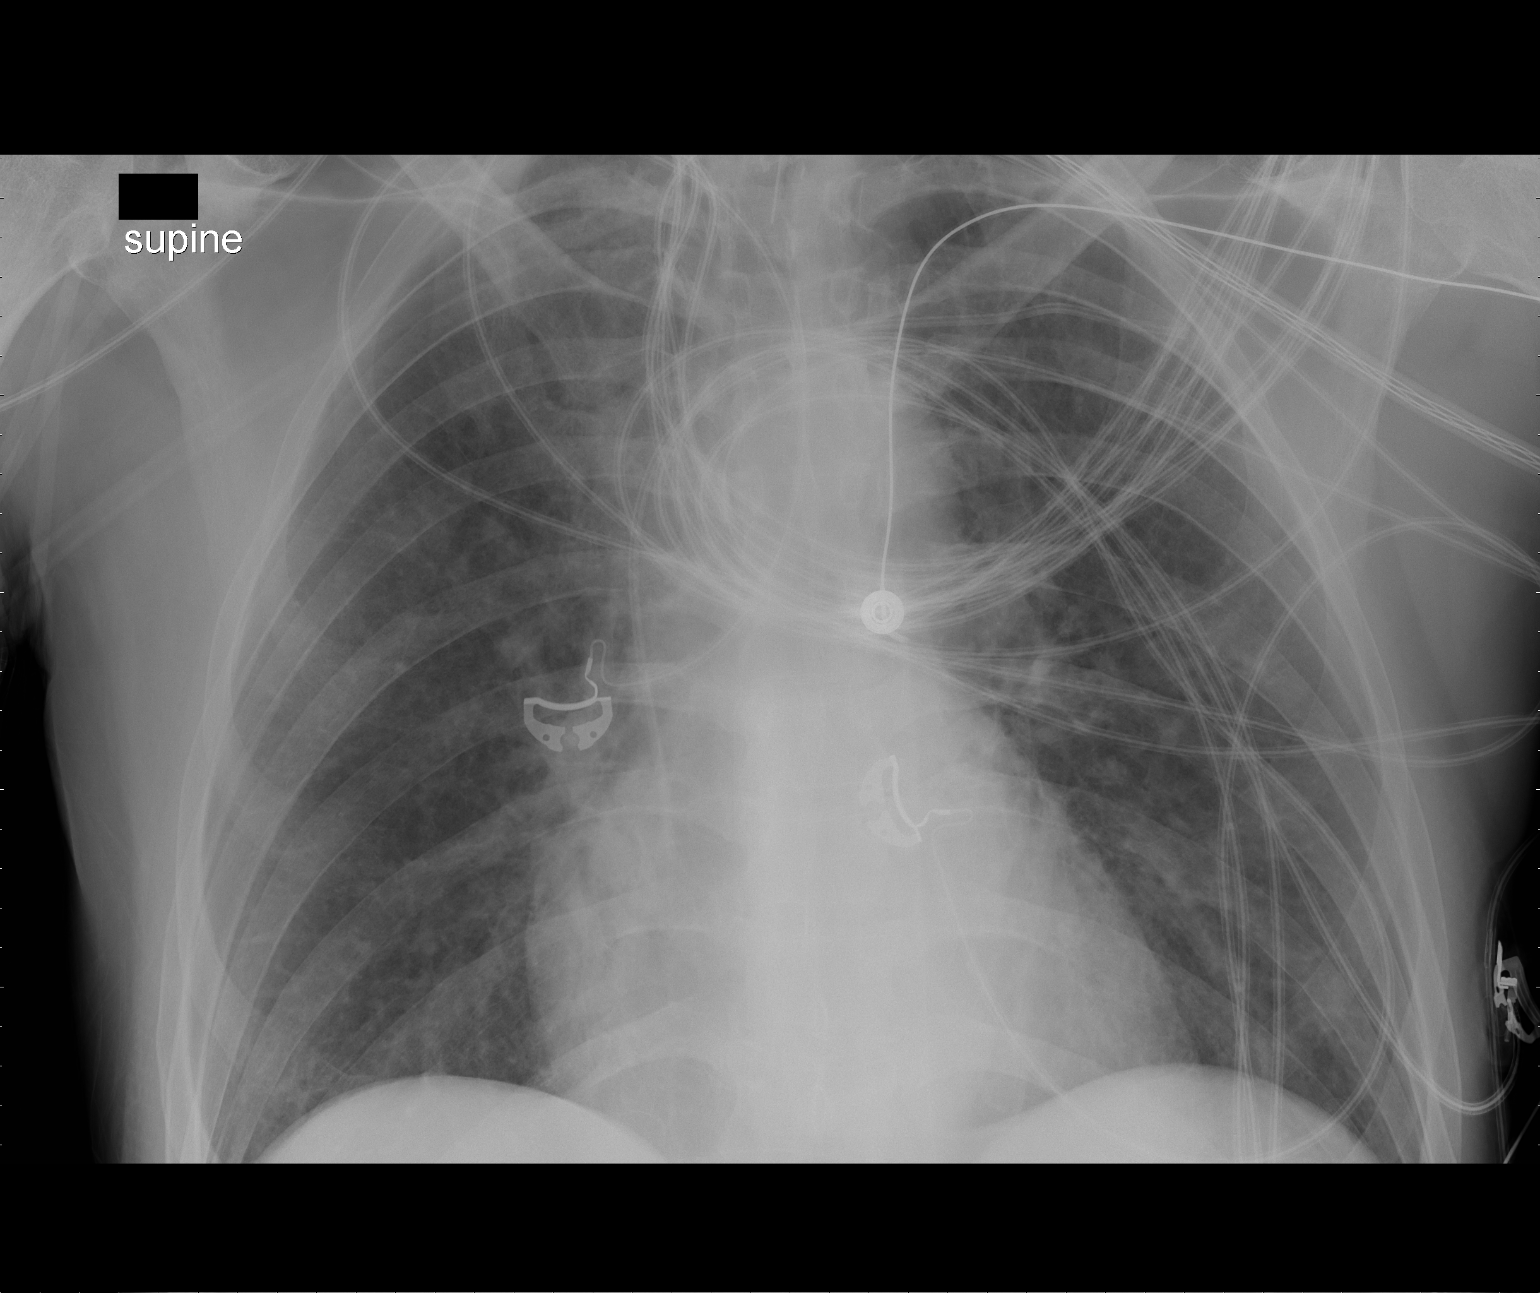

[1 of 1 positions shown; findings below may reference images not displayed]

FINDINGS: The endotracheal tube is 8 cm above the carina.  The
cardiac silhouette, mediastinal and hilar contours are within
normal limits.  The lungs are clear.  The bony thorax is intact.
IMPRESSION: No acute cardiopulmonary findings.

## 2011-10-01 IMAGING — CR DG CHEST 1V PORT
1 series · 1 of 1 positions shown · non-contrast
Comparison: Chest 09/03/2010.

CLINICAL DATA: Status post cardiopulmonary resuscitation.

PORTABLE CHEST - 1 VIEW

[AP]
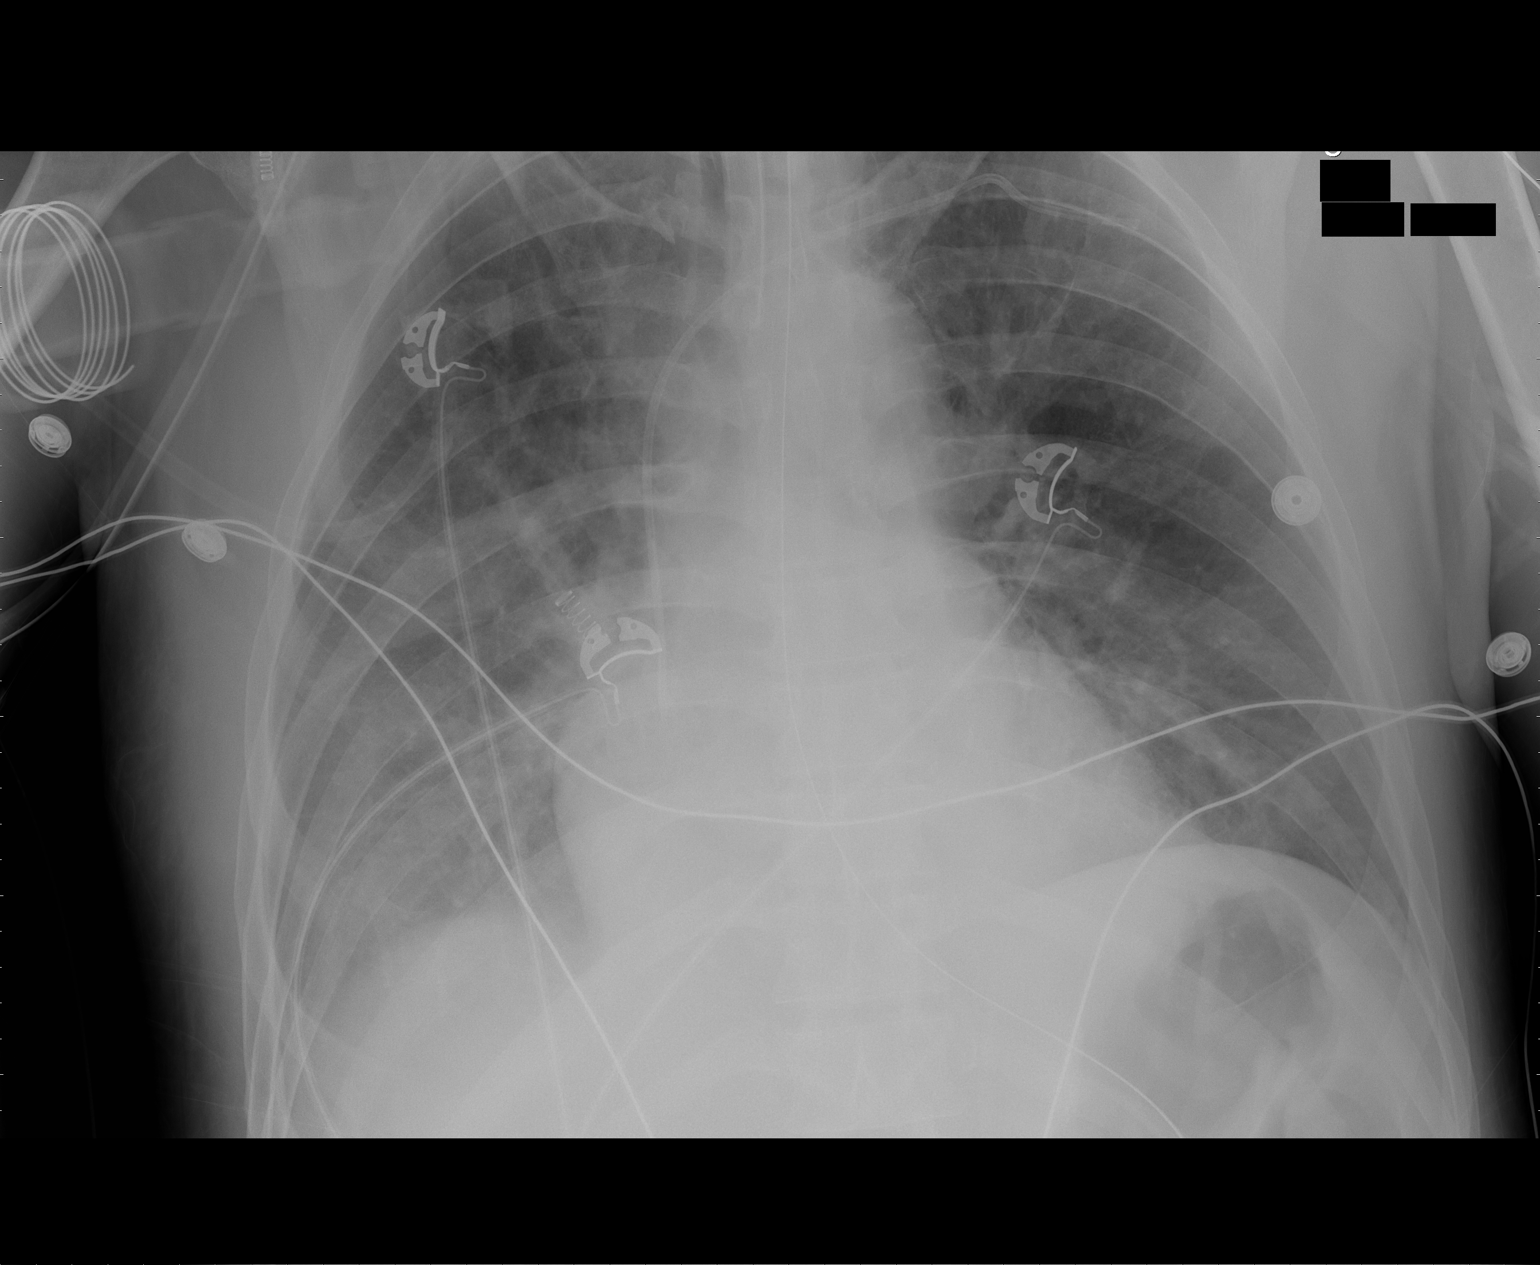

[1 of 1 positions shown; findings below may reference images not displayed]

FINDINGS: Support tubes and lines are unchanged and in good
position.  No pneumothorax.  Right worse than left airspace disease
is increased.  There is likely a right pleural effusion.
IMPRESSION: Worsening of airspace disease likely due to increased pulmonary
edema.

## 2011-10-07 ENCOUNTER — Telehealth: Payer: Self-pay | Admitting: Internal Medicine

## 2011-10-07 NOTE — Telephone Encounter (Signed)
Pt's mother calling to see if pt needs to be on abx prior to dental appt

## 2011-10-07 NOTE — Telephone Encounter (Signed)
Does not need from a device perspective  Will forward to Dr Excell Seltzer and his nurse for there recomendation

## 2011-10-07 NOTE — Telephone Encounter (Signed)
Maryann Alar looked at chart and said he did not need SBE  Called patient and left voicemail for him

## 2011-12-14 ENCOUNTER — Encounter: Payer: Self-pay | Admitting: Internal Medicine

## 2011-12-17 ENCOUNTER — Ambulatory Visit (INDEPENDENT_AMBULATORY_CARE_PROVIDER_SITE_OTHER): Payer: Self-pay | Admitting: Internal Medicine

## 2011-12-17 ENCOUNTER — Encounter: Payer: Self-pay | Admitting: Internal Medicine

## 2011-12-17 VITALS — BP 130/76 | HR 80 | Resp 18 | Ht 75.0 in | Wt 193.8 lb

## 2011-12-17 DIAGNOSIS — F17200 Nicotine dependence, unspecified, uncomplicated: Secondary | ICD-10-CM

## 2011-12-17 DIAGNOSIS — Z720 Tobacco use: Secondary | ICD-10-CM | POA: Insufficient documentation

## 2011-12-17 DIAGNOSIS — I201 Angina pectoris with documented spasm: Secondary | ICD-10-CM

## 2011-12-17 DIAGNOSIS — I4901 Ventricular fibrillation: Secondary | ICD-10-CM

## 2011-12-17 LAB — ICD DEVICE OBSERVATION
BRDY-0002RV: 40 {beats}/min
DEV-0020ICD: NEGATIVE
DEVICE MODEL ICD: 618386
FVT: 0
HV IMPEDENCE: 65 Ohm
PACEART VT: 0
RV LEAD AMPLITUDE: 11.6 mv
RV LEAD IMPEDENCE ICD: 350 Ohm
RV LEAD THRESHOLD: 1 V
TOT-0007: 2
TOT-0008: 0
TOT-0009: 1
TOT-0010: 5
TZON-0003SLOWVT: 340 ms
TZON-0004SLOWVT: 20
TZON-0005SLOWVT: 6
TZON-0010SLOWVT: 80 ms
VENTRICULAR PACING ICD: 0 pct
VF: 1

## 2011-12-17 NOTE — Patient Instructions (Signed)
Your physician wants you to follow-up in: 12 months with [email or web address] You will receive a reminder letter in the mail two months in advance. If you don't receive a letter, please call our office to schedule the follow-up appointment.   Remote monitoring is used to monitor your Pacemaker of ICD from home. This monitoring reduces the number of office visits required to check your device to one time per year. It allows [place] to keep an eye on the functioning of your device to ensure it is working properly. You are scheduled for a device check from home on 03/17/12. You may send your transmission at any time that day. If you have a wireless device, the transmission will be sent automatically. After your physician reviews your transmission, you will receive a postcard with your next transmission date.   Your physician has recommended you make the following change in your medication:  1) Increase your Diltiazem to 90mg  three times daily

## 2011-12-17 NOTE — Progress Notes (Signed)
Primary Cardiologist:  Dr Excell Seltzer  The patient presents today for routine electrophysiology followup.  Since last being seen in our clinic, the patient reports doing very well.  He has not received ICD therapy.  He has occasional chest pain at rest.  Today, he denies symptoms of palpitations, shortness of breath, orthopnea, PND, lower extremity edema, dizziness, presyncope, syncope, or neurologic sequela.  The patient feels that he is tolerating medications without difficulties and is otherwise without complaint today.   Past Medical History  Diagnosis Date  . CAD (coronary artery disease)     nonobstructive  . VF (ventricular fibrillation)     arrest  . 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 (doesn't say where LV goes to)   . 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 ICD (implantable cardiac defibrillator) in place     s/p ICD   Past Surgical History  Procedure Date  . Icd implantation     with defibrillation threshold testing. [employer]. [employer]    Current Outpatient Prescriptions  Medication Sig Dispense Refill  . aspirin 325 MG tablet Take 325 mg by mouth daily.        . Cholecalciferol (VITAMIN D3) 400 UNITS CAPS Take 400 Units by mouth daily.        . citalopram (CELEXA) 20 MG tablet Take 20 mg by mouth daily.        . Coenzyme Q10 (CO Q-10) 100 MG CAPS Take 200 mg by mouth daily.        Marland Kitchen diltiazem (CARDIZEM) 90 MG tablet Take one tablet 3 times daily      . isosorbide mononitrate (IMDUR) 30 MG 24 hr tablet Take 15 mg by mouth at bedtime.        Marland Kitchen levothyroxine (SYNTHROID, LEVOTHROID) 25 MCG tablet Take 25 mcg by mouth daily.        . pantoprazole (PROTONIX) 40 MG tablet Take 40 mg by mouth daily.        . vitamin C (ASCORBIC ACID) 500 MG tablet Take 500 mg by mouth daily.        . vitamin E 200 UNIT capsule Take 200 Units by mouth daily.          Allergies  Allergen Reactions  . Penicillins   . Small Pox Vaccine     History   Social History  . Marital  Status: Divorced    Spouse Name: N/A    Number of Children: N/A  . Years of Education: N/A   Occupational History  . Not on file.   Social History Main Topics  . Smoking status: Current Some Day Smoker    Types: Cigarettes  . Smokeless tobacco: Not on file   Comment: he continues to smoke occasionally, smoked for more than 20 years.   . Alcohol Use: Yes  . Drug Use: No  . Sexually Active: Not on file   Other Topics Concern  . Not on file   Social History Narrative   Divorced, 2 children.    ROS-  All systems are reviewed and are negative except as outlined in the HPI above   Physical Exam: Filed Vitals:   12/17/11 1610  BP: 130/76  Pulse: 80  Resp: 18  Height: 6\' 3"  (1.905 m)  Weight: 193 lb 12.8 oz (87.907 kg)    GEN- The patient is well appearing, alert and oriented x 3 today.   Head- normocephalic, atraumatic Eyes-  Sclera clear, conjunctiva pink  Ears- hearing intact Oropharynx- clear Neck- supple, no JVP Lymph- no cervical lymphadenopathy Lungs- Clear to ausculation bilaterally, normal work of breathing Chest- ICD pocket is well healed Heart- Regular rate and rhythm, no murmurs, rubs or gallops, PMI not laterally displaced GI- soft, NT, ND, + BS Extremities- no clubbing, cyanosis, or edema MS- no significant deformity or atrophy Skin- no rash or lesion Psych- euthymic mood, full affect Neuro- strength and sensation are intact  ICD interrogation- reviewed in detail today,  See PACEART report  Assessment and Plan:

## 2011-12-17 NOTE — Assessment & Plan Note (Signed)
Doing well s/p ICD ICD pocket is well healed Device interrogation today is reviewed and reveals normal device function No changes  Tachycardia in the VT monitor zone x 1 appears to be SVT and was likely sinus tachycardia.  No VF or ICD shocks delivered.

## 2011-12-17 NOTE — Assessment & Plan Note (Signed)
5 minutes spent in discussion of smoking cessation today He is not ready to quit

## 2011-12-17 NOTE — Assessment & Plan Note (Signed)
Doing well Chest pain mostly controlled He will try to increase diltiazem to 90mg  TID as tolerated  He will contact Dr Excell Seltzer if chest pain worsens.

## 2011-12-28 ENCOUNTER — Encounter: Payer: Self-pay | Admitting: Internal Medicine

## 2012-02-09 ENCOUNTER — Other Ambulatory Visit: Payer: Self-pay | Admitting: Cardiovascular Disease

## 2012-03-17 ENCOUNTER — Encounter: Payer: Self-pay | Admitting: Cardiovascular Disease

## 2012-03-17 ENCOUNTER — Encounter: Payer: Self-pay | Admitting: *Deleted

## 2012-03-17 ENCOUNTER — Ambulatory Visit (INDEPENDENT_AMBULATORY_CARE_PROVIDER_SITE_OTHER): Payer: Self-pay | Admitting: Cardiovascular Disease

## 2012-03-17 VITALS — BP 128/88 | HR 70 | Ht 75.0 in | Wt 191.4 lb

## 2012-03-17 DIAGNOSIS — I201 Angina pectoris with documented spasm: Secondary | ICD-10-CM

## 2012-03-17 DIAGNOSIS — I4901 Ventricular fibrillation: Secondary | ICD-10-CM

## 2012-03-17 DIAGNOSIS — I2510 Atherosclerotic heart disease of native coronary artery without angina pectoris: Secondary | ICD-10-CM

## 2012-03-17 NOTE — Patient Instructions (Signed)
Your physician wants you to follow-up in: 6 months. You will receive a reminder letter in the mail two months in advance. If you don't receive a letter, please call our office to schedule the follow-up appointment.  Your physician has recommended you make the following change in your medication: Stop Imdur (isosorbide mononitrate)

## 2012-03-17 NOTE — Assessment & Plan Note (Signed)
Stable on current medical therapy. I recommended that the patient take a holiday from isosorbide because of his ongoing headaches. He will continue with diltiazem and this angina worsens he will let me know and we can change his medications again.

## 2012-03-17 NOTE — Progress Notes (Signed)
   HPI:  46 year old gentleman presenting for followup evaluation.  The patient has coronary artery disease with history of coronary vasospasm and out of hospitals VF arrest. He has undergone ICD implantation. He's had no ICD discharges. He is treated medically with a long-acting nitrate and calcium channel blocker to treat vasospasm. His chest pain is greatly improved. He is having headaches related to Imdur and he reports difficulty sleeping secondary to this. He otherwise is doing well and specifically denies chest pain or pressure, dyspnea, edema, or palpitations. He continues to smoke.  Outpatient Encounter Prescriptions as of 03/17/2012  Medication Sig Dispense Refill  . acetaminophen (TYLENOL) 500 MG tablet Take 500 mg by mouth as needed.      Marland Kitchen aspirin 325 MG tablet Take 325 mg by mouth daily.        . Cholecalciferol (VITAMIN D3) 400 UNITS CAPS Take 5,000 Units by mouth daily.       . citalopram (CELEXA) 20 MG tablet Take 20 mg by mouth daily.        . Coenzyme Q10 (CO Q-10) 100 MG CAPS Take 200 mg by mouth daily.        Marland Kitchen diltiazem (CARDIZEM) 90 MG tablet TAKE ONE TABLET BY MOUTH THREE TIMES DAILY  270 tablet  2  . levothyroxine (SYNTHROID, LEVOTHROID) 25 MCG tablet Take 25 mcg by mouth daily.        . pantoprazole (PROTONIX) 40 MG tablet Take 40 mg by mouth daily.        . vitamin C (ASCORBIC ACID) 500 MG tablet Take 500 mg by mouth daily.        . vitamin E 200 UNIT capsule Take 400 Units by mouth daily.       Marland Kitchen DISCONTD: isosorbide mononitrate (IMDUR) 30 MG 24 hr tablet Take 15 mg by mouth at bedtime.          Allergies  Allergen Reactions  . Penicillins   . Small Pox Vaccine     Past Medical History  Diagnosis Date  . CAD (coronary artery disease)     nonobstructive  . VF (ventricular fibrillation)     arrest  . 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 (doesn't say where LV goes to)   . 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 ICD (implantable cardiac  defibrillator) in place     s/p ICD    ROS: Negative except as per HPI  BP 128/88  Pulse 70  Ht 6\' 3"  (1.905 m)  Wt 86.818 kg (191 lb 6.4 oz)  BMI 23.92 kg/m2  PHYSICAL EXAM: Pt is alert and oriented, NAD HEENT: normal Neck: JVP - normal, carotids 2+= without bruits Lungs: CTA bilaterally CV: RRR without murmur or gallop Abd: soft, NT, Positive BS, no hepatomegaly Ext: no C/C/E, distal pulses intact and equal Skin: warm/dry no rash  EKG:  Normal sinus rhythm 70 beats per minute, within normal limits.  ASSESSMENT AND PLAN:

## 2012-03-17 NOTE — Assessment & Plan Note (Signed)
Status post ICD. No therapies have been delivered. Patient followed by Dr. Johney Frame.

## 2012-03-23 ENCOUNTER — Encounter: Payer: Self-pay | Admitting: *Deleted

## 2012-03-31 ENCOUNTER — Ambulatory Visit (INDEPENDENT_AMBULATORY_CARE_PROVIDER_SITE_OTHER): Payer: Self-pay | Admitting: *Deleted

## 2012-03-31 ENCOUNTER — Encounter: Payer: Self-pay | Admitting: Internal Medicine

## 2012-03-31 DIAGNOSIS — I4901 Ventricular fibrillation: Secondary | ICD-10-CM

## 2012-04-01 LAB — REMOTE ICD DEVICE
BRDY-0002RV: 40 {beats}/min
DEV-0020ICD: NEGATIVE
DEVICE MODEL ICD: 618386
HV IMPEDENCE: 79 Ohm
RV LEAD AMPLITUDE: 11.6 mv
RV LEAD IMPEDENCE ICD: 350 Ohm
TZON-0003SLOWVT: 340 ms
TZON-0004SLOWVT: 20
TZON-0005SLOWVT: 6
TZON-0010SLOWVT: 80 ms

## 2012-04-14 ENCOUNTER — Encounter: Payer: Self-pay | Admitting: *Deleted

## 2012-04-14 NOTE — Progress Notes (Signed)
Remote icd check  

## 2012-07-01 ENCOUNTER — Ambulatory Visit (INDEPENDENT_AMBULATORY_CARE_PROVIDER_SITE_OTHER): Payer: Self-pay | Admitting: *Deleted

## 2012-07-01 ENCOUNTER — Encounter: Payer: Self-pay | Admitting: Internal Medicine

## 2012-07-01 DIAGNOSIS — I4901 Ventricular fibrillation: Secondary | ICD-10-CM

## 2012-07-01 LAB — REMOTE ICD DEVICE
BRDY-0002RV: 40 {beats}/min
DEV-0020ICD: NEGATIVE
DEVICE MODEL ICD: 618386
HV IMPEDENCE: 65 Ohm
RV LEAD AMPLITUDE: 11.6 mv
RV LEAD IMPEDENCE ICD: 310 Ohm
TZON-0003SLOWVT: 340 ms
TZON-0004SLOWVT: 20
TZON-0005SLOWVT: 6
TZON-0010SLOWVT: 80 ms
VENTRICULAR PACING ICD: 0 pct

## 2012-07-11 ENCOUNTER — Encounter: Payer: Self-pay | Admitting: *Deleted

## 2012-08-16 ENCOUNTER — Encounter: Payer: Self-pay | Admitting: Internal Medicine

## 2012-08-30 ENCOUNTER — Ambulatory Visit (INDEPENDENT_AMBULATORY_CARE_PROVIDER_SITE_OTHER): Payer: Self-pay | Admitting: Cardiovascular Disease

## 2012-08-30 ENCOUNTER — Encounter: Payer: Self-pay | Admitting: Cardiovascular Disease

## 2012-08-30 VITALS — BP 158/104 | HR 99 | Ht 75.0 in | Wt 165.8 lb

## 2012-08-30 DIAGNOSIS — I4901 Ventricular fibrillation: Secondary | ICD-10-CM

## 2012-08-30 DIAGNOSIS — I201 Angina pectoris with documented spasm: Secondary | ICD-10-CM

## 2012-08-30 MED ORDER — AMLODIPINE BESYLATE 10 MG PO TABS: 10.0000 mg | ORAL_TABLET | Freq: Every day | ORAL | Status: DC

## 2012-08-30 NOTE — Progress Notes (Signed)
   HPI:  12 her old woman presenting for followup evaluation. The patient is followed for coronary artery disease with history of coronary vasospasm. He had an out of hospital ventricular fibrillation arrest and he has undergone ICD implantation. His medical therapy had consisted of long-acting nitrate and calcium channel blocker, but I stopped his isosorbide at the time of his last office visit because of chronic headache.  The patient has had episodic dizziness. He relates this to his diltiazem. He has been back to twice daily dosing regimen at times, but increased back to 3 times daily after experiencing increased chest pain. His been doing fairly well lately. He denies chest pain or pressure with normal activities. He does have some chest discomfort with heavy exertion. He denies dyspnea, edema, or palpitations. He's had lightheadedness but no syncope.  Outpatient Encounter Prescriptions as of 08/30/2012  Medication Sig Dispense Refill  . aspirin 325 MG tablet Take 325 mg by mouth daily.        . Cholecalciferol (VITAMIN D3) 400 UNITS CAPS Take 5,000 Units by mouth daily.       . citalopram (CELEXA) 20 MG tablet Take 20 mg by mouth daily.        . Coenzyme Q10 (CO Q-10) 100 MG CAPS Take 100 mg by mouth daily.       Marland Kitchen diltiazem (CARDIZEM) 90 MG tablet TAKE ONE TABLET BY MOUTH THREE TIMES DAILY  270 tablet  2  . levothyroxine (SYNTHROID, LEVOTHROID) 25 MCG tablet Take 25 mcg by mouth daily.        . pantoprazole (PROTONIX) 40 MG tablet Take 40 mg by mouth daily.        . vitamin C (ASCORBIC ACID) 500 MG tablet Take 500 mg by mouth daily.        . vitamin E 200 UNIT capsule Take 400 Units by mouth daily.       Marland Kitchen DISCONTD: acetaminophen (TYLENOL) 500 MG tablet Take 500 mg by mouth as needed.        Allergies  Allergen Reactions  . Penicillins   . Small Pox Vaccine     Past Medical History  Diagnosis Date  . CAD (coronary artery disease)     nonobstructive  . VF (ventricular fibrillation)       arrest  . 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 (doesn't say where LV goes to)   . 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 ICD (implantable cardiac defibrillator) in place     s/p ICD    ROS: Negative except as per HPI  BP 158/104  Pulse 99  Ht 6\' 3"  (1.905 m)  Wt 75.206 kg (165 lb 12.8 oz)  BMI 20.72 kg/m2  PHYSICAL EXAM: Pt is alert and oriented, NAD HEENT: normal Neck: JVP - normal, carotids 2+= without bruits Lungs: CTA bilaterally CV: RRR without murmur or gallop Abd: soft, NT, Positive BS, no hepatomegaly Ext: no C/C/E, distal pulses intact and equal Skin: warm/dry no rash  EKG:  Sinus tachycardia 99 beats per minute, right atrial enlargement.  ASSESSMENT AND PLAN:

## 2012-08-30 NOTE — Assessment & Plan Note (Signed)
Blood pressure is elevated today. He seems to have difficulty tolerating diltiazem, and he is only able to obtain a short acting preparation because of cost issues. I think we should give him a trial of amlodipine 10 mg daily. I would like to see him back in 6 months for followup.

## 2012-08-30 NOTE — Patient Instructions (Addendum)
Your physician has recommended you make the following change in your medication: STOP Diltiazem, START Amlodipine 10mg  take one by mouth daily  Your physician wants you to follow-up in: 6 MONTHS.  You will receive a reminder letter in the mail two months in advance. If you don't receive a letter, please call our office to schedule the follow-up appointment.

## 2012-08-30 NOTE — Assessment & Plan Note (Signed)
No ICD discharges. He will followup with Dr. Johney Frame.

## 2012-10-03 ENCOUNTER — Encounter: Payer: Self-pay | Admitting: *Deleted

## 2012-10-03 ENCOUNTER — Ambulatory Visit (INDEPENDENT_AMBULATORY_CARE_PROVIDER_SITE_OTHER): Payer: Self-pay | Admitting: *Deleted

## 2012-10-03 ENCOUNTER — Encounter: Payer: Self-pay | Admitting: Internal Medicine

## 2012-10-03 DIAGNOSIS — Z95810 Presence of automatic (implantable) cardiac defibrillator: Secondary | ICD-10-CM

## 2012-10-03 DIAGNOSIS — I4901 Ventricular fibrillation: Secondary | ICD-10-CM

## 2012-10-03 LAB — REMOTE ICD DEVICE
BRDY-0002RV: 40 {beats}/min
DEV-0020ICD: NEGATIVE
DEVICE MODEL ICD: 618386
HV IMPEDENCE: 84 Ohm
RV LEAD AMPLITUDE: 11.6 mv
RV LEAD IMPEDENCE ICD: 360 Ohm
TZON-0003SLOWVT: 340 ms
TZON-0004SLOWVT: 20
TZON-0005SLOWVT: 6
TZON-0010SLOWVT: 80 ms
VENTRICULAR PACING ICD: 0 pct

## 2012-10-13 ENCOUNTER — Encounter: Payer: Self-pay | Admitting: *Deleted

## 2012-12-16 ENCOUNTER — Ambulatory Visit (INDEPENDENT_AMBULATORY_CARE_PROVIDER_SITE_OTHER): Payer: Medicaid Other | Admitting: Internal Medicine

## 2012-12-16 ENCOUNTER — Encounter: Payer: Self-pay | Admitting: Internal Medicine

## 2012-12-16 VITALS — BP 132/86 | HR 115 | Ht 75.0 in | Wt 159.0 lb

## 2012-12-16 DIAGNOSIS — F17200 Nicotine dependence, unspecified, uncomplicated: Secondary | ICD-10-CM

## 2012-12-16 DIAGNOSIS — Z95810 Presence of automatic (implantable) cardiac defibrillator: Secondary | ICD-10-CM

## 2012-12-16 DIAGNOSIS — Z720 Tobacco use: Secondary | ICD-10-CM

## 2012-12-16 DIAGNOSIS — I4901 Ventricular fibrillation: Secondary | ICD-10-CM

## 2012-12-16 DIAGNOSIS — I201 Angina pectoris with documented spasm: Secondary | ICD-10-CM

## 2012-12-16 LAB — ICD DEVICE OBSERVATION
BRDY-0002RV: 40 {beats}/min
CHARGE TIME: 9.5 s
DEV-0020ICD: NEGATIVE
DEVICE MODEL ICD: 618386
FVT: 0
HV IMPEDENCE: 86 Ohm
PACEART VT: 0
RV LEAD AMPLITUDE: 11.6 mv
RV LEAD IMPEDENCE ICD: 387.5 Ohm
RV LEAD THRESHOLD: 0.75 V
TOT-0007: 2
TOT-0008: 0
TOT-0009: 1
TOT-0010: 9
TZON-0003SLOWVT: 340 ms
TZON-0004SLOWVT: 20
TZON-0005SLOWVT: 6
TZON-0010SLOWVT: 80 ms
VENTRICULAR PACING ICD: 0 pct
VF: 102

## 2012-12-16 NOTE — Patient Instructions (Addendum)
Your physician recommends that you schedule a follow-up appointment in: 6 weeks with Sunday Spillers    Your physician wants you to follow-up in: 12 months with Dr Jacquiline Doe will receive a reminder letter in the mail two months in advance. If you don't receive a letter, please call our office to schedule the follow-up appointment.  Remote monitoring is used to monitor your Pacemaker of ICD from home. This monitoring reduces the number of office visits required to check your device to one time per year. It allows [place] to keep an eye on the functioning of your device to ensure it is working properly. You are scheduled for a device check from home on 03/20/12. You may send your transmission at any time that day. If you have a wireless device, the transmission will be sent automatically. After your physician reviews your transmission, you will receive a postcard with your next transmission date.   A chest x-ray takes a picture of the organs and structures inside the chest, including the heart, lungs, and blood vessels. This test can show several things, including, whether the heart is enlarges; whether fluid is building up in the lungs; and whether pacemaker / defibrillator leads are still in place.  Your physician has recommended that you have a pulmonary function test. Pulmonary Function Tests are a group of tests that measure how well air moves in and out of your lungs.  Your physician has requested that you have an echocardiogram. Echocardiography is a painless test that uses sound waves to create images of your heart. It provides your doctor with information about the size and shape of your heart and how well your heart's chambers and valves are working. This procedure takes approximately one hour. There are no restrictions for this procedure.  Your physician discussed the hazards of tobacco use. Tobacco use cessation is recommended and techniques and options to help you quit were  discussed.

## 2012-12-18 ENCOUNTER — Encounter: Payer: Self-pay | Admitting: Internal Medicine

## 2012-12-18 NOTE — Progress Notes (Signed)
Primary Cardiologist:  Dr Loura Pardon is a 46 y.o. male who presents today for routine electrophysiology followup.  Since last being seen in our clinic, the patient reports doing reasonably well. He reports stable dyspnea.  He finds that he is SOB with moderate activity or any significant lifting.  He is occasionally SOB at rest.  Sats today on room air are 90%.  Unfortunately, he continues to smoke.  Today, he denies symptoms of palpitations, chest pain,  lower extremity edema, dizziness, presyncope, syncope, or ICD shocks.  The patient is otherwise without complaint today.   Past Medical History  Diagnosis Date  . CAD (coronary artery disease)     nonobstructive  . VF (ventricular fibrillation)     arrest  . 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 (doesn't say where LV goes to)   . 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 ICD (implantable cardiac defibrillator) in place     s/p ICD   Past Surgical History  Procedure Date  . Icd implantation     with defibrillation threshold testing. [employer]    Current Outpatient Prescriptions  Medication Sig Dispense Refill  . amLODipine (NORVASC) 10 MG tablet Take 1 tablet (10 mg total) by mouth daily.  90 tablet  3  . aspirin 325 MG tablet Take 325 mg by mouth daily.        . Cholecalciferol (VITAMIN D3) 400 UNITS CAPS Take 5,000 Units by mouth daily.       . Coenzyme Q10 (CO Q-10) 100 MG CAPS Take 100 mg by mouth daily.       . vitamin C (ASCORBIC ACID) 500 MG tablet Take 500 mg by mouth daily.        . vitamin E 200 UNIT capsule Take 400 Units by mouth daily.       . [DISCONTINUED] isosorbide mononitrate (IMDUR) 30 MG 24 hr tablet Take 15 mg by mouth at bedtime.          Physical Exam: Filed Vitals:   12/16/12 1008  BP: 132/86  Pulse: 115  Height: 6\' 3"  (1.905 m)  Weight: 159 lb (72.122 kg)  SpO2: 89%    GEN- The patient is well appearing, alert and oriented x 3 today.   Head- normocephalic,  atraumatic Eyes-  Sclera clear, conjunctiva pink Ears- hearing intact Oropharynx- clear Lungs- Clear to ausculation bilaterally, normal work of breathing Chest- ICD pocket is well healed Heart- Regular rate and rhythm, no murmurs, rubs or gallops, PMI not laterally displaced GI- soft, NT, ND, + BS Extremities- no clubbing, cyanosis, or edema  ICD interrogation- reviewed in detail today,  See PACEART report  Assessment and Plan:  1.  Coronary artery spasm/ prior VF arrest No chest pain recently Echo to evaluate EF given SOB Normal ICD function He appears to have svt (likely sinus tach) in the VT zone,  No VF or ICD shocks delivered. See Pace Art report No changes today  2. Hypoxia/ SOB/ respiratory failure I am concerned about his resting hypoxia and chronic dyspnea.  Given longstanding and ongoing tobacco use, I think that further testing is necessary. I will order a PA/Lateral CXR as well as PFTs. Echo also to evaluate EF.  3. Ongoing tobacco use Cessation is strongly advised,  Unfortunately, he is not ready to quit   Follow-up with Dr Excell Seltzer

## 2012-12-22 ENCOUNTER — Telehealth: Payer: Self-pay | Admitting: Internal Medicine

## 2012-12-22 NOTE — Telephone Encounter (Signed)
Follow up:    Patient's mother called in again to follow-up on her initial call.  Please call back.

## 2012-12-22 NOTE — Telephone Encounter (Signed)
I talked with [place] and we have pts pfts scheduled for 12/26/12 at 1pm Returned call to mother and she is aware of PFT schedule & pt will have cxr made same day. Appt for March scheduled at her request with Dr. Excell Seltzer. Mylo Red RN

## 2012-12-22 NOTE — Telephone Encounter (Signed)
New Problem:    Patient's mother called in wanting to have her so have his PFT and chest X-ray done on 12/26/12 when his ECHO is.  Please call back.

## 2012-12-26 ENCOUNTER — Ambulatory Visit (INDEPENDENT_AMBULATORY_CARE_PROVIDER_SITE_OTHER): Payer: No Typology Code available for payment source | Admitting: Internal Medicine

## 2012-12-26 ENCOUNTER — Ambulatory Visit (INDEPENDENT_AMBULATORY_CARE_PROVIDER_SITE_OTHER)
Admission: RE | Admit: 2012-12-26 | Discharge: 2012-12-26 | Disposition: A | Discharge status: Home / Self Care | Payer: No Typology Code available for payment source | Source: Ambulatory Visit | Attending: Internal Medicine | Admitting: Internal Medicine

## 2012-12-26 ENCOUNTER — Ambulatory Visit (HOSPITAL_COMMUNITY): Payer: No Typology Code available for payment source | Attending: Cardiology | Admitting: Radiology

## 2012-12-26 DIAGNOSIS — I4901 Ventricular fibrillation: Secondary | ICD-10-CM

## 2012-12-26 DIAGNOSIS — I2510 Atherosclerotic heart disease of native coronary artery without angina pectoris: Secondary | ICD-10-CM | POA: Insufficient documentation

## 2012-12-26 DIAGNOSIS — I059 Rheumatic mitral valve disease, unspecified: Secondary | ICD-10-CM | POA: Insufficient documentation

## 2012-12-26 DIAGNOSIS — R0989 Other specified symptoms and signs involving the circulatory and respiratory systems: Secondary | ICD-10-CM | POA: Insufficient documentation

## 2012-12-26 DIAGNOSIS — F17200 Nicotine dependence, unspecified, uncomplicated: Secondary | ICD-10-CM | POA: Insufficient documentation

## 2012-12-26 DIAGNOSIS — R0609 Other forms of dyspnea: Secondary | ICD-10-CM | POA: Insufficient documentation

## 2012-12-26 DIAGNOSIS — I369 Nonrheumatic tricuspid valve disorder, unspecified: Secondary | ICD-10-CM | POA: Insufficient documentation

## 2012-12-26 LAB — PULMONARY FUNCTION TEST

## 2012-12-26 NOTE — Progress Notes (Signed)
Echocardiogram performed.  

## 2012-12-26 NOTE — Progress Notes (Signed)
PFT done today. 

## 2013-02-01 ENCOUNTER — Ambulatory Visit: Payer: Medicaid Other | Admitting: Nurse Practitioner

## 2013-02-06 ENCOUNTER — Encounter: Payer: Self-pay | Admitting: Internal Medicine

## 2013-02-09 ENCOUNTER — Ambulatory Visit: Payer: Self-pay | Admitting: Physician Assistant

## 2013-02-16 ENCOUNTER — Encounter: Payer: Self-pay | Admitting: Physician Assistant

## 2013-02-16 ENCOUNTER — Ambulatory Visit (INDEPENDENT_AMBULATORY_CARE_PROVIDER_SITE_OTHER): Payer: No Typology Code available for payment source | Admitting: Physician Assistant

## 2013-02-16 VITALS — BP 128/90 | HR 111 | Ht 75.0 in | Wt 156.8 lb

## 2013-02-16 DIAGNOSIS — I498 Other specified cardiac arrhythmias: Secondary | ICD-10-CM

## 2013-02-16 DIAGNOSIS — I201 Angina pectoris with documented spasm: Secondary | ICD-10-CM

## 2013-02-16 DIAGNOSIS — K219 Gastro-esophageal reflux disease without esophagitis: Secondary | ICD-10-CM

## 2013-02-16 DIAGNOSIS — R112 Nausea with vomiting, unspecified: Secondary | ICD-10-CM

## 2013-02-16 DIAGNOSIS — E039 Hypothyroidism, unspecified: Secondary | ICD-10-CM

## 2013-02-16 DIAGNOSIS — R000 Tachycardia, unspecified: Secondary | ICD-10-CM

## 2013-02-16 DIAGNOSIS — F1010 Alcohol abuse, uncomplicated: Secondary | ICD-10-CM

## 2013-02-16 MED ORDER — THIAMINE HCL 100 MG PO TABS: 100.0000 mg | ORAL_TABLET | Freq: Every day | ORAL | Status: DC

## 2013-02-16 MED ORDER — FOLIC ACID 1 MG PO TABS: 1.0000 mg | ORAL_TABLET | Freq: Every day | ORAL | Status: DC

## 2013-02-16 MED ORDER — THERA VITAL M PO TABS: 1.0000 | ORAL_TABLET | Freq: Every day | ORAL | Status: DC

## 2013-02-16 MED ORDER — PANTOPRAZOLE SODIUM 40 MG PO TBEC: 40.0000 mg | DELAYED_RELEASE_TABLET | Freq: Every day | ORAL | Status: DC

## 2013-02-16 NOTE — Progress Notes (Signed)
[address], [address] [place], [place]  [address] Phone: [phone], Fax:  [phone]  Date:  02/16/2013   ID:  Adam Orr, DOB [birth year], MRN [id number]  PCP:  No primary provider on file.  Primary Cardiologist:  Dr. Tonny Bollman   Primary Electrophysiologist:  Dr. Lewayne Bunting    History of Present Illness: Adam Orr is a 47 y.o. male who returns for follow up.  He has a history of CAD as well as coronary vasospasm. He is status post out of hospital ventricular fibrillation arrest, status post ICD implantation.  LHC in 9/11 of the time of his V. Fib arrest: EF 65%, small D1 50-60%, mid OM1 50-70%, AV circumflex 40-50%. Echo 12/13: EF 50-55%, grade 1 diastolic dysfunction, MAC. Patient last seen by Dr. Excell Seltzer 9/13. Patient had difficulty tolerating diltiazem at that time. His isosorbide had been stopped in the past due to chronic headaches. His diltiazem was switched amlodipine. Last seen in this office by Dr. Johney Frame 12/13. He did complain of dyspnea and his sats were noted to be low on room air at 90%. Further testing was pursued. Echocardiogram was done as noted above. PFTs were within normal limits (FEV1 78%, FEV1/FVC 75%). Chest x-ray demonstrated no acute findings.  He presents to the clinic today with significant nausea and vomiting.  Unfortunately he drinks 12 beers a night.  This is the only real form of caloric intake he has at this time.  Says he eats "snacks" some.  Otherwise has no appetite. She was previously on Protonix but ran out since [facility] closed. He is also off his thyroid medication (not sure what he used to be on) and his antidepressant.  He has rapid palpitations.  He notes some occasional chest pain.  No exertional chest pain.  No dyspnea.  No orthopnea, PND, edema.  His mother is here with him.  She has noted some staggering recently.  He denies confusion.    Labs (11/11):  K 4.5, creatinine 1.01, ALT 10, LDL 110, , Hgb 12.3 Labs  (2/12):    TSH 2.023  Wt Readings from Last 3 Encounters:  02/16/13 156 lb 12.8 oz (71.124 kg)  12/16/12 159 lb (72.122 kg)  08/30/12 165 lb 12.8 oz (75.206 kg)     Past Medical History  Diagnosis Date  . CAD (coronary artery disease)     nonobstructive  . VF (ventricular fibrillation)     arrest  . 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 (doesn't say where LV goes to)   . 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 ICD (implantable cardiac defibrillator) in place     s/p ICD    Current Outpatient Prescriptions  Medication Sig Dispense Refill  . amLODipine (NORVASC) 10 MG tablet Take 1 tablet (10 mg total) by mouth daily.  90 tablet  3  . aspirin 325 MG tablet Take 325 mg by mouth daily.        . [DISCONTINUED] isosorbide mononitrate (IMDUR) 30 MG 24 hr tablet Take 15 mg by mouth at bedtime.         No current facility-administered medications for this visit.    Allergies:    Allergies  Allergen Reactions  . Penicillins   . Small Pox Vaccine     Social History:  The patient  reports that he has been smoking Cigarettes.  He has been smoking about 0.00 packs per day. He does not have any  smokeless tobacco history on file. He reports that  drinks alcohol. He reports that he does not use illicit drugs.   ROS:  Please see the history of present illness.   No melena, hematochezia, hemoptysis, hematemesis, abdominal pain, diarrhea. No fevers, cough.  All other systems reviewed and negative.   PHYSICAL EXAM: VS:  BP 128/90  Pulse 111  Ht 6\' 3"  (1.905 m)  Wt 156 lb 12.8 oz (71.124 kg)  BMI 19.6 kg/m2  SpO2 98% Chronically ill appearing male; he vomited several times while I was in the room HEENT: normal Neck: no JVD Endo: no TM Cardiac:  normal S1, S2; rapid reg rhytm; no murmur Lungs:  clear to auscultation bilaterally, no wheezing, rhonchi or rales Abd: soft, nontender, no hepatomegaly Ext: no edema Skin: warm and dry Neuro:  Somewhat ataxic gait  noted; CNs 2-12 intact; resting tremor noted  EKG:  Sinus tachy, HR 111, normal axis, LVH, PVCs     ASSESSMENT AND PLAN:  1. Nausea and Vomiting:  I suspect he has significant gastritis from ETOH abuse.  I am concerned he is malnourished and dehydrated.  This would explain his sinus tachy.  I had a long d/w the patient and his mother today.  I believe he needs ETOH cessation.  I have recommended he go to the ED for admission today to rule significant metabolic derangement and then admission to a rehab program.  He refuses to go.  I will check labs:  BMET, CBC, TSH, LFTs, amylase, lipase.  If abnormal, we will call him and recommend he go to the ED for evaluation. 2. GERD:  As noted, I believe he has gastritis induced by ETOH.  Will refill his Protonix 40 mg QD. 3. Alcohol Abuse:  He has a tremor and some ataxia.  I suppose he is vitamin deficient.  Start MVI QD, Thiamine 100 mg QD, Folate 1 mg QD.  As noted, he has been advised to go to the ED for detoxification admission.  He refused. 4. 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  He did have some CP recently. But this was in the setting of retching and vomiting.  He denies exertional CP. He did have non-obs CAD at cath.  He is not a good candidate for ischemic evaluation at this time.  Continue amlodipine.  Consider further testing if CP symptoms persist or worsen. 5. Ventricular Fibrillation, s/p ICD:  Device interrogated today.  He has had SVT noted in the monitor zone.  No Rx delivered.  I suppose the SVT is in response to his alcoholism. 6. Sinus Tachycardia:  I suppose he is not on a beta blocker due to concerns over inducing vasospasm.  I think his ST is a physiologic response to his alcohol abuse and its aftermath. 7. Hypothyroidism:  He has had no meds since [facility] closed. Check TSH. 8. Disposition:  I have recommended he go to the ED.  He refused.  I have recommended he go to the urgent care for primary care follow up  as they are covering for patients since [facility] closed.  He can keep f/u with Dr. Tonny Bollman in March.    Signed, Tereso Newcomer, PA-C  4:20 PM 02/16/2013

## 2013-02-16 NOTE — Patient Instructions (Addendum)
Your physician recommends that you return for lab work in: TODAY BMET, CBC W/DIFF, LFT, TSH, AMLYASE, LIPASE  KEEP YOUR APPT WITH DR. Excell Seltzer ON 03/07/13 @ 4:15  YOU HAVE BEEN ADVISED BY SCOTT WEAVER, PAC TO GO TO [place] ED TODAY TO BE ADMITTED FOR ALCOHOL DETOX   START PROTONIX 40 MG DAILY; AN RX HAS BEEN SENT IN TODAY  START OTC MULTIVITAMIN START OTC FOLIC ACID START OTC THIAMINE

## 2013-02-17 ENCOUNTER — Other Ambulatory Visit: Payer: Self-pay | Admitting: Internal Medicine

## 2013-02-17 ENCOUNTER — Encounter: Payer: Self-pay | Admitting: Internal Medicine

## 2013-02-17 DIAGNOSIS — I4901 Ventricular fibrillation: Secondary | ICD-10-CM

## 2013-02-17 LAB — AMYLASE: Amylase: 54 U/L (ref 27–131)

## 2013-02-17 LAB — CBC WITH DIFFERENTIAL/PLATELET
Basophils Absolute: 0 10*3/uL (ref 0.0–0.1)
Basophils Relative: 0.1 % (ref 0.0–3.0)
Eosinophils Absolute: 0 10*3/uL (ref 0.0–0.7)
Eosinophils Relative: 0.2 % (ref 0.0–5.0)
HCT: 44 % (ref 39.0–52.0)
Hemoglobin: 15 g/dL (ref 13.0–17.0)
Lymphocytes Relative: 12.6 % (ref 12.0–46.0)
Lymphs Abs: 1.2 10*3/uL (ref 0.7–4.0)
MCHC: 34 g/dL (ref 30.0–36.0)
MCV: 105.2 fl — ABNORMAL HIGH (ref 78.0–100.0)
Monocytes Absolute: 0.6 10*3/uL (ref 0.1–1.0)
Monocytes Relative: 6 % (ref 3.0–12.0)
Neutro Abs: 7.4 10*3/uL (ref 1.4–7.7)
Neutrophils Relative %: 81.1 % — ABNORMAL HIGH (ref 43.0–77.0)
Platelets: 177 10*3/uL (ref 150.0–400.0)
RBC: 4.19 Mil/uL — ABNORMAL LOW (ref 4.22–5.81)
RDW: 15.2 % — ABNORMAL HIGH (ref 11.5–14.6)
WBC: 9.1 10*3/uL (ref 4.5–10.5)

## 2013-02-17 LAB — HEPATIC FUNCTION PANEL
ALT: 56 U/L — ABNORMAL HIGH (ref 0–53)
AST: 155 U/L — ABNORMAL HIGH (ref 0–37)
Albumin: 4.2 g/dL (ref 3.5–5.2)
Alkaline Phosphatase: 63 U/L (ref 39–117)
Bilirubin, Direct: 0.5 mg/dL — ABNORMAL HIGH (ref 0.0–0.3)
Total Bilirubin: 1.8 mg/dL — ABNORMAL HIGH (ref 0.3–1.2)
Total Protein: 7.5 g/dL (ref 6.0–8.3)

## 2013-02-17 LAB — ICD DEVICE OBSERVATION
CHARGE TIME: 9.3 s
DEV-0020ICD: NEGATIVE
DEVICE MODEL ICD: 618386
HV IMPEDENCE: 78 Ohm
RV LEAD IMPEDENCE ICD: 380 Ohm
TZON-0003SLOWVT: 340 ms
TZON-0004SLOWVT: 20
TZON-0005SLOWVT: 6
TZON-0010SLOWVT: 80 ms
VENTRICULAR PACING ICD: 1 pct

## 2013-02-17 LAB — TSH: TSH: 2.48 u[IU]/mL (ref 0.35–5.50)

## 2013-02-17 LAB — BASIC METABOLIC PANEL
BUN: 6 mg/dL (ref 6–23)
CO2: 22 mEq/L (ref 19–32)
Calcium: 9.6 mg/dL (ref 8.4–10.5)
Chloride: 98 mEq/L (ref 96–112)
Creatinine, Ser: 1 mg/dL (ref 0.4–1.5)
GFR: 87.13 mL/min (ref >60.00)
Glucose, Bld: 91 mg/dL (ref 70–99)
Potassium: 4.4 mEq/L (ref 3.5–5.1)
Sodium: 137 mEq/L (ref 135–145)

## 2013-02-17 LAB — LIPASE: Lipase: 16 U/L (ref 11.0–59.0)

## 2013-03-07 ENCOUNTER — Ambulatory Visit: Payer: Medicaid Other | Admitting: Cardiovascular Disease

## 2013-03-20 ENCOUNTER — Other Ambulatory Visit: Payer: Self-pay | Admitting: Internal Medicine

## 2013-03-20 ENCOUNTER — Ambulatory Visit (INDEPENDENT_AMBULATORY_CARE_PROVIDER_SITE_OTHER): Payer: No Typology Code available for payment source | Admitting: *Deleted

## 2013-03-20 ENCOUNTER — Encounter: Payer: Self-pay | Admitting: Internal Medicine

## 2013-03-20 DIAGNOSIS — Z95810 Presence of automatic (implantable) cardiac defibrillator: Secondary | ICD-10-CM

## 2013-03-20 DIAGNOSIS — I4901 Ventricular fibrillation: Secondary | ICD-10-CM

## 2013-03-22 LAB — REMOTE ICD DEVICE
BRDY-0002RV: 40 {beats}/min
DEV-0020ICD: NEGATIVE
DEVICE MODEL ICD: 618386
HV IMPEDENCE: 82 Ohm
RV LEAD AMPLITUDE: 11.3 mv
RV LEAD IMPEDENCE ICD: 400 Ohm
TZON-0003SLOWVT: 340 ms
TZON-0004SLOWVT: 20
TZON-0005SLOWVT: 6
TZON-0010SLOWVT: 80 ms
VENTRICULAR PACING ICD: 1 pct

## 2013-04-11 ENCOUNTER — Encounter: Payer: Self-pay | Admitting: *Deleted

## 2013-04-19 ENCOUNTER — Ambulatory Visit (INDEPENDENT_AMBULATORY_CARE_PROVIDER_SITE_OTHER): Payer: No Typology Code available for payment source | Admitting: Cardiovascular Disease

## 2013-04-19 ENCOUNTER — Encounter: Payer: Self-pay | Admitting: Cardiovascular Disease

## 2013-04-19 VITALS — BP 110/88 | HR 93 | Ht 75.0 in | Wt 154.0 lb

## 2013-04-19 DIAGNOSIS — I201 Angina pectoris with documented spasm: Secondary | ICD-10-CM

## 2013-04-19 DIAGNOSIS — I4901 Ventricular fibrillation: Secondary | ICD-10-CM

## 2013-04-19 DIAGNOSIS — F329 Major depressive disorder, single episode, unspecified: Secondary | ICD-10-CM

## 2013-04-19 MED ORDER — AMLODIPINE BESYLATE 10 MG PO TABS: 10.0000 mg | ORAL_TABLET | Freq: Every day | ORAL | Status: DC

## 2013-04-19 MED ORDER — CITALOPRAM HYDROBROMIDE 20 MG PO TABS: 20.0000 mg | ORAL_TABLET | Freq: Every day | ORAL | Status: DC

## 2013-04-19 NOTE — Progress Notes (Signed)
HPI:  47 year old gentleman presenting for followup evaluation. He has a history of out-of-hospital ventricular fibrillation arrest. He's undergone ICD implantation. He has coronary vasospasm documented by marked inferior wall ST elevation that occurred after his cardiac arrest. He was taken emergently for cardiac catheterization and found to have nonobstructive CAD.  The patient's had a great deal of difficulty with depression. After his out of hospital arrest, he was off of alcohol for a few years. Unfortunately he has been drinking heavily now for some time. He was seen in February by Tereso Newcomer and at that time was drinking at least a 12 pack of beer every day. He's cut back but still drinks too much.  He's had no chest pain, dyspnea, or leg swelling. He has some problems with his gait and notes unsteadiness when he first gets up. He has no other complaints today. He was seen a counselor but because of his lack of resources has not been able to go back.  Outpatient Encounter Prescriptions as of 04/19/2013  Medication Sig Dispense Refill  . amLODipine (NORVASC) 10 MG tablet Take 1 tablet (10 mg total) by mouth daily.  90 tablet  3  . aspirin 325 MG tablet Take 325 mg by mouth daily.        . folic acid (FOLVITE) 400 MCG tablet Take 400 mcg by mouth daily.      . Multiple Vitamins-Minerals (MULTIVITAMIN) tablet Take 1 tablet by mouth daily.      . pantoprazole (PROTONIX) 40 MG tablet Take 1 tablet (40 mg total) by mouth daily.  30 tablet  11  . Thiamine HCl (THIAMINE PO) Take 300 mg by mouth daily.      . [DISCONTINUED] folic acid (FOLVITE) 1 MG tablet Take 1 tablet (1 mg total) by mouth daily.      . [DISCONTINUED] thiamine 100 MG tablet Take 1 tablet (100 mg total) by mouth daily.       No facility-administered encounter medications on file as of 04/19/2013.    Allergies  Allergen Reactions  . Penicillins   . Small Pox Vaccine     Past Medical History  Diagnosis Date  . CAD  (coronary artery disease)     nonobstructive  . VF (ventricular fibrillation)     arrest  . 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 (doesn't say where LV goes to)   . 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 ICD (implantable cardiac defibrillator) in place     s/p ICD    ROS: Negative except as per HPI  BP 110/88  Pulse 93  Ht 6\' 3"  (1.905 m)  Wt 69.854 kg (154 lb)  BMI 19.25 kg/m2  SpO2 99%  PHYSICAL EXAM: Pt is alert and oriented, NAD, flat affect HEENT: normal Neck: JVP - normal, carotids 2+= without bruits Lungs: CTA bilaterally CV: RRR without murmur or gallop Abd: soft, NT, Positive BS, no hepatomegaly Ext: no C/C/E, distal pulses intact and equal Skin: warm/dry no rash  EKG:  Normal sinus rhythm 93 beats per minute, right atrial enlargement, rightward axis, otherwise within normal limits.  2-D echocardiogram 12/26/2012: Left ventricle: The cavity size was normal. Wall thickness was normal. Systolic function was normal. The estimated ejection fraction was in the range of 50% to 55%. Wall motion was normal; there were no regional wall motion abnormalities. Doppler parameters are consistent with abnormal left ventricular relaxation (grade 1 diastolic dysfunction).  ------------------------------------------------------------ Aortic valve: Mildly thickened  leaflets. Cusp separation was normal. Doppler: Transvalvular velocity was within the normal range. There was no stenosis. No regurgitation.  ------------------------------------------------------------ Aorta: Aortic root: The aortic root was normal in size.  ------------------------------------------------------------ Mitral valve: Calcified annulus. Leaflet separation was normal. Doppler: Transvalvular velocity was within the normal range. There was no evidence for stenosis. Trivial regurgitation.  ------------------------------------------------------------ Left atrium: The atrium was  normal in size.  ------------------------------------------------------------ Right ventricle: The cavity size was normal. Pacer wire or catheter noted in right ventricle. Systolic function was normal.  ------------------------------------------------------------ Pulmonic valve: Structurally normal valve. Cusp separation was normal. Doppler: Transvalvular velocity was within the normal range. No regurgitation.  ------------------------------------------------------------ Tricuspid valve: Structurally normal valve. Leaflet separation was normal. Doppler: Transvalvular velocity was within the normal range. Trivial regurgitation.  ------------------------------------------------------------ Right atrium: The atrium was normal in size. Pacer wire or catheter noted in right atrium.  ------------------------------------------------------------ Pericardium: There was no pericardial effusion.  ------------------------------------------------------------ Systemic veins: Inferior vena cava: The vessel was normal in size; the respirophasic diameter changes were in the normal range (= 50%); findings are consistent with normal central venous pressure.  ASSESSMENT AND PLAN: 1. Prinzmetal angina. Stable on amlodipine without episodes of chest pain.  2. Out of hospital cardiac arrest. No further recurrence of ventricular arrhythmia. He is status post ICD implant.  3. Depression and alcoholism. Difficult situation especially considering his very limited resources. He does not have a primary care physician and cannot afford to go see anyone. He did much better on citalopram and I'm going to prescribe this to him again. Since he's run out of this medication and has not had access to regular medical care is turned to alcohol. I wrote him a prescription for citalopram 20 mg daily. Will refer him to behavioral health for counseling.  For follow-up I'll see him back in 6 months.  Tonny Bollman 04/19/2013 4:31 PM

## 2013-04-19 NOTE — Patient Instructions (Addendum)
Your physician has recommended you make the following change in your medication: START Citalopram 20mg  take one by mouth once a day  Your physician wants you to follow-up in: 6 MONTHS with Dr Excell Seltzer.  You will receive a reminder letter in the mail two months in advance. If you don't receive a letter, please call our office to schedule the follow-up appointment.  You have been referred to Behavioral Medicine for Depression.

## 2013-06-26 ENCOUNTER — Ambulatory Visit (INDEPENDENT_AMBULATORY_CARE_PROVIDER_SITE_OTHER): Payer: Self-pay | Admitting: *Deleted

## 2013-06-26 ENCOUNTER — Encounter: Payer: Self-pay | Admitting: Internal Medicine

## 2013-06-26 DIAGNOSIS — Z95810 Presence of automatic (implantable) cardiac defibrillator: Secondary | ICD-10-CM

## 2013-06-26 DIAGNOSIS — I4901 Ventricular fibrillation: Secondary | ICD-10-CM

## 2013-06-27 LAB — REMOTE ICD DEVICE
BRDY-0002RV: 40 {beats}/min
DEV-0020ICD: NEGATIVE
DEVICE MODEL ICD: 618386
HV IMPEDENCE: 79 Ohm
RV LEAD AMPLITUDE: 10.9 mv
RV LEAD IMPEDENCE ICD: 390 Ohm
TZON-0003SLOWVT: 340 ms
TZON-0004SLOWVT: 20
TZON-0005SLOWVT: 6
TZON-0010SLOWVT: 80 ms
VENTRICULAR PACING ICD: 1 pct

## 2013-07-05 ENCOUNTER — Encounter: Payer: Self-pay | Admitting: *Deleted

## 2013-08-30 ENCOUNTER — Emergency Department (HOSPITAL_COMMUNITY): Payer: Medicare Other

## 2013-08-30 ENCOUNTER — Encounter (HOSPITAL_COMMUNITY): Payer: Self-pay | Admitting: Emergency Medicine

## 2013-08-30 ENCOUNTER — Inpatient Hospital Stay (HOSPITAL_COMMUNITY)
Admission: EM | Admit: 2013-08-30 | Discharge: 2013-09-12 | DRG: 640 | Disposition: A | Discharge status: Skilled Nursing Facility | Payer: Medicare Other | Attending: Internal Medicine | Admitting: Internal Medicine

## 2013-08-30 DIAGNOSIS — E860 Dehydration: Secondary | ICD-10-CM | POA: Diagnosis not present

## 2013-08-30 DIAGNOSIS — F411 Generalized anxiety disorder: Secondary | ICD-10-CM | POA: Diagnosis present

## 2013-08-30 DIAGNOSIS — F332 Major depressive disorder, recurrent severe without psychotic features: Secondary | ICD-10-CM

## 2013-08-30 DIAGNOSIS — E43 Unspecified severe protein-calorie malnutrition: Secondary | ICD-10-CM

## 2013-08-30 DIAGNOSIS — Z681 Body mass index (BMI) 19 or less, adult: Secondary | ICD-10-CM

## 2013-08-30 DIAGNOSIS — R627 Adult failure to thrive: Secondary | ICD-10-CM | POA: Diagnosis present

## 2013-08-30 DIAGNOSIS — Z720 Tobacco use: Secondary | ICD-10-CM

## 2013-08-30 DIAGNOSIS — E519 Thiamine deficiency, unspecified: Secondary | ICD-10-CM | POA: Diagnosis present

## 2013-08-30 DIAGNOSIS — Z95810 Presence of automatic (implantable) cardiac defibrillator: Secondary | ICD-10-CM

## 2013-08-30 DIAGNOSIS — Z79899 Other long term (current) drug therapy: Secondary | ICD-10-CM

## 2013-08-30 DIAGNOSIS — Z23 Encounter for immunization: Secondary | ICD-10-CM

## 2013-08-30 DIAGNOSIS — F3289 Other specified depressive episodes: Secondary | ICD-10-CM | POA: Diagnosis present

## 2013-08-30 DIAGNOSIS — I951 Orthostatic hypotension: Secondary | ICD-10-CM | POA: Diagnosis present

## 2013-08-30 DIAGNOSIS — I2510 Atherosclerotic heart disease of native coronary artery without angina pectoris: Secondary | ICD-10-CM | POA: Diagnosis present

## 2013-08-30 DIAGNOSIS — I201 Angina pectoris with documented spasm: Secondary | ICD-10-CM

## 2013-08-30 DIAGNOSIS — F1994 Other psychoactive substance use, unspecified with psychoactive substance-induced mood disorder: Secondary | ICD-10-CM

## 2013-08-30 DIAGNOSIS — F1020 Alcohol dependence, uncomplicated: Secondary | ICD-10-CM | POA: Diagnosis present

## 2013-08-30 DIAGNOSIS — I4901 Ventricular fibrillation: Secondary | ICD-10-CM

## 2013-08-30 DIAGNOSIS — Z7982 Long term (current) use of aspirin: Secondary | ICD-10-CM

## 2013-08-30 DIAGNOSIS — F17200 Nicotine dependence, unspecified, uncomplicated: Secondary | ICD-10-CM | POA: Diagnosis present

## 2013-08-30 DIAGNOSIS — F1096 Alcohol use, unspecified with alcohol-induced persisting amnestic disorder: Secondary | ICD-10-CM | POA: Diagnosis present

## 2013-08-30 DIAGNOSIS — F1010 Alcohol abuse, uncomplicated: Secondary | ICD-10-CM

## 2013-08-30 DIAGNOSIS — R5381 Other malaise: Secondary | ICD-10-CM

## 2013-08-30 DIAGNOSIS — R531 Weakness: Secondary | ICD-10-CM

## 2013-08-30 DIAGNOSIS — F329 Major depressive disorder, single episode, unspecified: Secondary | ICD-10-CM | POA: Diagnosis present

## 2013-08-30 HISTORY — DX: Anxiety disorder, unspecified: F41.9

## 2013-08-30 HISTORY — DX: Depression, unspecified: F32.A

## 2013-08-30 HISTORY — DX: Major depressive disorder, single episode, unspecified: F32.9

## 2013-08-30 LAB — CBC
HCT: 46.5 % (ref 39.0–52.0)
Hemoglobin: 17 g/dL (ref 13.0–17.0)
MCH: 35.8 pg — ABNORMAL HIGH (ref 26.0–34.0)
MCHC: 36.6 g/dL — ABNORMAL HIGH (ref 30.0–36.0)
MCV: 97.9 fL (ref 78.0–100.0)
Platelets: 206 K/uL (ref 150–400)
RBC: 4.75 MIL/uL (ref 4.22–5.81)
RDW: 12.4 % (ref 11.5–15.5)
WBC: 6.2 K/uL (ref 4.0–10.5)

## 2013-08-30 LAB — COMPREHENSIVE METABOLIC PANEL WITH GFR
ALT: 69 U/L — ABNORMAL HIGH (ref 0–53)
AST: 45 U/L — ABNORMAL HIGH (ref 0–37)
Albumin: 4 g/dL (ref 3.5–5.2)
Alkaline Phosphatase: 50 U/L (ref 39–117)
BUN: 15 mg/dL (ref 6–23)
CO2: 24 meq/L (ref 19–32)
Calcium: 10.7 mg/dL — ABNORMAL HIGH (ref 8.4–10.5)
Chloride: 97 meq/L (ref 96–112)
Creatinine, Ser: 0.84 mg/dL (ref 0.50–1.35)
GFR calc Af Amer: >90 mL/min
GFR calc non Af Amer: >90 mL/min
Glucose, Bld: 125 mg/dL — ABNORMAL HIGH (ref 70–99)
Potassium: 3.9 meq/L (ref 3.5–5.1)
Sodium: 140 meq/L (ref 135–145)
Total Bilirubin: 1.8 mg/dL — ABNORMAL HIGH (ref 0.3–1.2)
Total Protein: 7.6 g/dL (ref 6.0–8.3)

## 2013-08-30 LAB — RAPID URINE DRUG SCREEN, HOSP PERFORMED
Amphetamines: NOT DETECTED
Barbiturates: NOT DETECTED
Benzodiazepines: NOT DETECTED
Cocaine: NOT DETECTED
Opiates: NOT DETECTED
Tetrahydrocannabinol: NOT DETECTED

## 2013-08-30 LAB — ETHANOL: Alcohol, Ethyl (B): <11 mg/dL (ref 0–11)

## 2013-08-30 LAB — URINALYSIS, ROUTINE W REFLEX MICROSCOPIC
Glucose, UA: NEGATIVE mg/dL
Hgb urine dipstick: NEGATIVE
Ketones, ur: 15 mg/dL — AB
Nitrite: NEGATIVE
Protein, ur: NEGATIVE mg/dL
Specific Gravity, Urine: 1.026 (ref 1.005–1.030)
Urobilinogen, UA: 2 mg/dL — ABNORMAL HIGH (ref 0.0–1.0)
pH: 6.5 (ref 5.0–8.0)

## 2013-08-30 LAB — GRAM STAIN: Special Requests: NORMAL

## 2013-08-30 LAB — SALICYLATE LEVEL: Salicylate Lvl: <2 mg/dL — ABNORMAL LOW (ref 2.8–20.0)

## 2013-08-30 LAB — ACETAMINOPHEN LEVEL: Acetaminophen (Tylenol), Serum: <15 ug/mL (ref 10–30)

## 2013-08-30 LAB — URINE MICROSCOPIC-ADD ON

## 2013-08-30 NOTE — ED Notes (Signed)
Pt here with mother c/o generalized weakness and poor PO intake; pt heavy drinker and mother unsure of last drink; pt sts last night; pt with increased confusion; pt with hx of cardiac arrest 3 years ago; now has AICD; unsure if pt compliant with meds

## 2013-08-30 NOTE — ED Notes (Signed)
Dr. Wong at the bedside.

## 2013-08-30 NOTE — ED Provider Notes (Signed)
CSN: [id number]     Arrival date & time 08/30/13  1737 History   First MD Initiated Contact with Patient 08/30/13 1800     Chief Complaint  Patient presents with  . Medical Clearance  . Dehydration   (Consider location/radiation/quality/duration/timing/severity/associated sxs/prior Treatment) Patient is a 47 y.o. male presenting with general illness. The history is provided by the patient. No language interpreter was used.  Illness Location:  Home Quality:  Weakness, failure to thrive, cannot walk Severity:  Moderate Onset quality:  Gradual Duration:  1 week Timing:  Constant Progression:  Worsening Chronicity:  New Context:  Worsening decrease in appetite, inability to walk, failure to thrive Relieved by:  Nothing Worsened by:  ETOH abuse Ineffective treatments:  None tried Associated symptoms: no abdominal pain, no chest pain, no congestion, no cough, no diarrhea, no fever, no headaches, no nausea, no rash, no rhinorrhea, no shortness of breath, no sore throat and no vomiting   Risk factors:  ETOH abuse   Past Medical History  Diagnosis Date  . CAD (coronary artery disease)     nonobstructive  . VF (ventricular fibrillation)     arrest  . 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 (doesn't say where LV goes to)   . 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 ICD (implantable cardiac defibrillator) in place     s/p ICD   Past Surgical History  Procedure Laterality Date  . Icd implantation      with defibrillation threshold testing. [employer]   History reviewed. No pertinent family history. History  Substance Use Topics  . Smoking status: Current Some Day Smoker    Types: Cigarettes  . Smokeless tobacco: Not on file     Comment: he continues to smoke occasionally, smoked for more than 20 years.   . Alcohol Use: Yes    Review of Systems  Constitutional: Negative for fever.  HENT: Negative for congestion, sore throat and rhinorrhea.   Respiratory:  Negative for cough and shortness of breath.   Cardiovascular: Negative for chest pain.  Gastrointestinal: Negative for nausea, vomiting, abdominal pain and diarrhea.  Genitourinary: Negative for dysuria and hematuria.  Skin: Negative for rash.  Neurological: Negative for syncope, light-headedness and headaches.  Psychiatric/Behavioral: Positive for confusion. Negative for hallucinations.  All other systems reviewed and are negative.    Allergies  Penicillins and Smallpox vaccine  Home Medications   Current Outpatient Rx  Name  Route  Sig  Dispense  Refill  . amLODipine (NORVASC) 10 MG tablet   Oral   Take 1 tablet (10 mg total) by mouth daily.   90 tablet   3   . aspirin 325 MG EC tablet   Oral   Take 325 mg by mouth daily.         . pantoprazole (PROTONIX) 40 MG tablet   Oral   Take 1 tablet (40 mg total) by mouth daily.   30 tablet   11    BP 124/103  Pulse 102  Temp(Src) 98.7 F (37.1 C) (Oral)  Resp 18  SpO2 98% Physical Exam  Nursing note and vitals reviewed. Constitutional: He is oriented to person, place, and time. He appears well-developed and well-nourished.  HENT:  Head: Normocephalic and atraumatic.  Right Ear: External ear normal.  Left Ear: External ear normal.  Eyes: EOM are normal. Pupils are equal, round, and reactive to light.  Neck: Normal range of motion. Neck supple.  Cardiovascular:  Normal rate, regular rhythm and intact distal pulses.  Exam reveals no gallop and no friction rub.   No murmur heard. Pulmonary/Chest: Effort normal and breath sounds normal. No respiratory distress. He has no wheezes. He has no rales. He exhibits no tenderness.  Abdominal: Soft. Bowel sounds are normal. He exhibits no distension. There is no tenderness. There is no rebound.  Musculoskeletal: Normal range of motion. He exhibits no edema and no tenderness.  Lymphadenopathy:    He has no cervical adenopathy.  Neurological: He is alert and oriented to person,  place, and time.  Skin: Skin is warm. No rash noted.  Psychiatric: He has a normal mood and affect. His behavior is normal.    ED Course  Procedures (including critical care time) Labs Review Labs Reviewed  CBC - Abnormal; Notable for the following:    MCH 35.8 (*)    MCHC 36.6 (*)    All other components within normal limits  COMPREHENSIVE METABOLIC PANEL - Abnormal; Notable for the following:    Glucose, Bld 125 (*)    Calcium 10.7 (*)    AST 45 (*)    ALT 69 (*)    Total Bilirubin 1.8 (*)    All other components within normal limits  SALICYLATE LEVEL - Abnormal; Notable for the following:    Salicylate Lvl <2.0 (*)    All other components within normal limits  URINALYSIS, ROUTINE W REFLEX MICROSCOPIC - Abnormal; Notable for the following:    Color, Urine ORANGE (*)    Bilirubin Urine MODERATE (*)    Ketones, ur 15 (*)    Urobilinogen, UA 2.0 (*)    Leukocytes, UA TRACE (*)    All other components within normal limits  AMMONIA - Abnormal; Notable for the following:    Ammonia <10 (*)    All other components within normal limits  URINE MICROSCOPIC-ADD ON - Abnormal; Notable for the following:    Bacteria, UA FEW (*)    All other components within normal limits  GRAM STAIN  ACETAMINOPHEN LEVEL  ETHANOL  URINE RAPID DRUG SCREEN (HOSP PERFORMED)   Imaging Review Dg Chest 2 View  08/30/2013   *RADIOLOGY REPORT*  Clinical Data: Medical clearance.  Dehydration.  CHEST - 2 VIEW  Comparison: 12/26/2012  Findings: The heart size and pulmonary vascularity are normal and the lungs are clear.  Transvenous defibrillator in place.  No acute osseous abnormality.  IMPRESSION: No acute disease.   Original Report Authenticated By: Francene Boyers, M.D.   Ct Head Wo Contrast  08/30/2013   *RADIOLOGY REPORT*  Clinical Data: Generalized weakness  CT HEAD WITHOUT CONTRAST  Technique:  Contiguous axial images were obtained from the base of the skull through the vertex without contrast.   Comparison: 08/31/2010  Findings: There is no evidence for acute hemorrhage, hydrocephalus, or abnormal extra-axial fluid collection.  No definite CT evidence for acute infarction.  4 mm hyperattenuating focus in the region of the left foramen [place] may represent a tiny colloid cyst.  Diffuse loss of parenchymal volume is consistent with atrophy. Patchy low attenuation in the deep hemispheric and periventricular white matter is nonspecific, but likely reflects chronic microvascular ischemic demyelination.  IMPRESSION: No acute intracranial abnormality.  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  There is no evidence for hydrocephalus.  Follow up non emergent brain MRI without and with contrast could be used to further evaluate, as clinically warranted.  Band-like x-ray   Original Report Authenticated By:  Kennith Center, M.D.    MDM   1. Dehydration    6:02 PM Pt is a 47 y.o. male with pertinent PMHX of CAD,VF s/p AICD who presents to the ED with confusion, depression. Per mother pt has been more confused over the past week. Decreased appetite, feelings of worthlessness and guilt. Denies SI,HI, AVH. Has not eaten in 1 week. Off antidepressants because he cannot drink beer with them. Unknown last drink no tactile hallucinations, no seizures per mother. Pt drinks 12pk every night and vodka if he can get his hands on it. Endorses sadness, hypersomnia, decreased appetite and subjective leg pains. Denies chest pain, recent illness, shortness of breath, fevers, dysuria  On exam AFVSS: Lungs CTA, abdomen soft, NT, no rash. Neurologic exam: CN I-XII: grossly intact, Sensation: normal in upper and lower extremities, Strength 5/5 in both upper and lower extremities, Coordination intact. Plan for Medical clearance: CBC, CMP, salicylate, acetaminophen, ETOH, EKG, CT head, UA, gram stain, UDS, CXR  EKG personally reviewed by myself showed NSR, prolonged QT Rate of 70, PR , QRS 88ms  QT/QTC 445/491ms, normal axis, without evidence of new ischemia. Comparison showed  similar, indication: confusion  Review of labs: no leukocytosis, H&H 17.0/46.5. Negative acetaminophen and salicylate level. ETOH level negative. CMP showed no electrolyte abnormalities. AST 45, ALT 69 likely related to ETOH abuse.  CXR PA/LAT for confusion per my read showed no pneumonia, no cardiomegaly. AICD in place. CT head wo contrast negative for intracranial abnormality. 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 Orthostatic hypotension,. Given orthostatic hypotension, Vitamin B1 deficiency and inability to walk will obs for dehydration and orthostatic hypotension.   The patient appears reasonably stabilized for admission considering the current resources, flow, and capabilities available in the ED at this time, and I doubt any other [facility] requiring further screening and/or treatment in the ED prior to admission.   Plan for admission to hospitalist for further evaluation and management. PT transferred to tele VSS.  Labs, EKG and imaging reviewed by myself and considered in medical decision making if ordered.  Imaging interpreted by radiology. Pt was discussed with my attending, Dr. Erlene Quan, MD 08/31/13 [phone]

## 2013-08-30 NOTE — ED Notes (Signed)
Patient transported to xray and CT. 

## 2013-08-30 NOTE — ED Notes (Signed)
Notified pt of urine sample stated "i can't go right now" offered something to drink and gave him a urinal.

## 2013-08-30 NOTE — ED Notes (Addendum)
Pt was unable to ambulate. Stood pt up at bedside but not able to take any steps. Pt was also dizzy and unstable on his feet. Family at bedside stated that when pt is at home he holds onto the walls and other things in the house and will sit down on his bottom to come down the steps.

## 2013-08-31 ENCOUNTER — Encounter (HOSPITAL_COMMUNITY): Payer: Self-pay

## 2013-08-31 DIAGNOSIS — F1020 Alcohol dependence, uncomplicated: Secondary | ICD-10-CM

## 2013-08-31 DIAGNOSIS — F1010 Alcohol abuse, uncomplicated: Secondary | ICD-10-CM | POA: Diagnosis present

## 2013-08-31 DIAGNOSIS — R531 Weakness: Secondary | ICD-10-CM | POA: Diagnosis present

## 2013-08-31 DIAGNOSIS — F1994 Other psychoactive substance use, unspecified with psychoactive substance-induced mood disorder: Secondary | ICD-10-CM

## 2013-08-31 LAB — AMMONIA: Ammonia: 12 umol/L (ref 11–60)

## 2013-08-31 LAB — PHOSPHORUS: Phosphorus: 3.4 mg/dL (ref 2.3–4.6)

## 2013-08-31 LAB — MAGNESIUM: Magnesium: 1.7 mg/dL (ref 1.5–2.5)

## 2013-08-31 MED ORDER — THIAMINE HCL 100 MG/ML IJ SOLN
Freq: Once | INTRAVENOUS | Status: AC
  Administered 2013-08-31: 02:00:00 via INTRAVENOUS
  Filled 2013-08-31: qty 1000

## 2013-08-31 MED ORDER — HYDROMORPHONE HCL PF 1 MG/ML IJ SOLN: 1.0000 mg | INTRAMUSCULAR | Status: AC | PRN

## 2013-08-31 MED ORDER — ENSURE COMPLETE PO LIQD
237.0000 mL | Freq: Two times a day (BID) | ORAL | Status: DC
  Administered 2013-08-31 – 2013-09-12 (×21): 237 mL via ORAL

## 2013-08-31 MED ORDER — VITAMIN B-1 100 MG PO TABS
100.0000 mg | ORAL_TABLET | Freq: Every day | ORAL | Status: DC
  Administered 2013-08-31 – 2013-09-07 (×8): 100 mg via ORAL
  Filled 2013-08-31 (×8): qty 1

## 2013-08-31 MED ORDER — PNEUMOCOCCAL VAC POLYVALENT 25 MCG/0.5ML IJ INJ
0.5000 mL | INJECTION | INTRAMUSCULAR | Status: AC
  Administered 2013-09-01: 0.5 mL via INTRAMUSCULAR
  Filled 2013-08-31: qty 0.5

## 2013-08-31 MED ORDER — NICOTINE 14 MG/24HR TD PT24
14.0000 mg | MEDICATED_PATCH | Freq: Every day | TRANSDERMAL | Status: DC
  Administered 2013-08-31 – 2013-09-12 (×12): 14 mg via TRANSDERMAL
  Filled 2013-08-31 (×13): qty 1

## 2013-08-31 MED ORDER — AMLODIPINE BESYLATE 10 MG PO TABS
10.0000 mg | ORAL_TABLET | Freq: Every day | ORAL | Status: DC
  Administered 2013-08-31 – 2013-09-11 (×11): 10 mg via ORAL
  Filled 2013-08-31 (×12): qty 1

## 2013-08-31 MED ORDER — SODIUM CHLORIDE 0.9 % IJ SOLN
3.0000 mL | Freq: Two times a day (BID) | INTRAMUSCULAR | Status: DC
  Administered 2013-08-31 – 2013-09-12 (×24): 3 mL via INTRAVENOUS

## 2013-08-31 MED ORDER — FOLIC ACID 1 MG PO TABS
1.0000 mg | ORAL_TABLET | Freq: Every day | ORAL | Status: DC
  Administered 2013-08-31 – 2013-09-12 (×13): 1 mg via ORAL
  Filled 2013-08-31 (×13): qty 1

## 2013-08-31 MED ORDER — ONDANSETRON HCL 4 MG/2ML IJ SOLN: 4.0000 mg | Freq: Three times a day (TID) | INTRAMUSCULAR | Status: AC | PRN

## 2013-08-31 MED ORDER — THIAMINE HCL 100 MG/ML IJ SOLN
100.0000 mg | Freq: Every day | INTRAMUSCULAR | Status: DC
  Filled 2013-08-31 (×6): qty 1

## 2013-08-31 MED ORDER — HEPARIN SODIUM (PORCINE) 5000 UNIT/ML IJ SOLN
5000.0000 [IU] | Freq: Three times a day (TID) | INTRAMUSCULAR | Status: DC
  Administered 2013-08-31 – 2013-09-12 (×35): 5000 [IU] via SUBCUTANEOUS
  Filled 2013-08-31 (×40): qty 1

## 2013-08-31 MED ORDER — LORAZEPAM 2 MG/ML IJ SOLN: 1.0000 mg | Freq: Four times a day (QID) | INTRAMUSCULAR | Status: AC | PRN

## 2013-08-31 MED ORDER — ADULT MULTIVITAMIN W/MINERALS CH
1.0000 | ORAL_TABLET | Freq: Every day | ORAL | Status: DC
  Administered 2013-08-31 – 2013-09-12 (×12): 1 via ORAL
  Filled 2013-08-31 (×13): qty 1

## 2013-08-31 MED ORDER — LORAZEPAM 1 MG PO TABS: 1.0000 mg | ORAL_TABLET | Freq: Four times a day (QID) | ORAL | Status: AC | PRN

## 2013-08-31 MED ORDER — MIRTAZAPINE 15 MG PO TBDP
15.0000 mg | ORAL_TABLET | Freq: Every day | ORAL | Status: DC
  Administered 2013-08-31 – 2013-09-11 (×12): 15 mg via ORAL
  Filled 2013-08-31 (×14): qty 1

## 2013-08-31 MED ORDER — CITALOPRAM HYDROBROMIDE 20 MG PO TABS
20.0000 mg | ORAL_TABLET | Freq: Every day | ORAL | Status: DC
  Administered 2013-09-01 – 2013-09-12 (×11): 20 mg via ORAL
  Filled 2013-08-31 (×12): qty 1

## 2013-08-31 MED ORDER — PANTOPRAZOLE SODIUM 40 MG PO TBEC
40.0000 mg | DELAYED_RELEASE_TABLET | Freq: Every day | ORAL | Status: DC
  Administered 2013-08-31 – 2013-09-12 (×10): 40 mg via ORAL
  Filled 2013-08-31 (×10): qty 1

## 2013-08-31 MED ORDER — ASPIRIN EC 325 MG PO TBEC
325.0000 mg | DELAYED_RELEASE_TABLET | Freq: Every day | ORAL | Status: DC
  Administered 2013-08-31 – 2013-09-12 (×12): 325 mg via ORAL
  Filled 2013-08-31 (×13): qty 1

## 2013-08-31 NOTE — Care Management Note (Signed)
    Page 1 of 2   09/12/2013     4:40:07 PM   CARE MANAGEMENT NOTE 09/12/2013  Patient:  Adam Orr, Adam Orr   Account Number:  [id number]  Date Initiated:  08/31/2013  Documentation initiated by:  Leidy Massar  Subjective/Objective Assessment:   PT ADM WITH DEHYDRATION, WEAKNESS, AND ETOH ABUSE.  PTA, PT INDEPENDENT, LIVES WITH FAMILY.     Action/Plan:   CSW CONSULTED TO PROVIDE ETOH COUNSELING.  P.T. CONSULT PENDING.   Anticipated DC Date:  09/12/2013   Anticipated DC Plan:  SKILLED NURSING FACILITY  In-house referral  Financial Counselor  Clinical Social Worker      DC Planning Services  CM consult      Choice offered to / List presented to:             Status of service:  Completed, signed off Medicare Important Message given?   (If response is "NO", the following Medicare IM given date fields will be blank) Date Medicare IM given:   Date Additional Medicare IM given:    Discharge Disposition:  SKILLED NURSING FACILITY  Per UR Regulation:  Reviewed for med. necessity/level of care/duration of stay  If discussed at Long Length of Stay Meetings, dates discussed:   09/05/2013  09/07/2013  09/12/2013    Comments:  09/12/13 Kyndal Heringer,RN,BSN [phone] PT DISCHARGING TO SNF TODAY, PER CSW ARRANGEMENTS.  09/08/13 Marena Witts,RN,BSN [phone] AWAITING [facility] REPS TO VISIT PT IN PREPARATION FOR SNF AT DC.  CSW CONT TO FOLLOW TO PURSUE DC TO SNF, AS MOTHER UNABLE TO TAKE PT HOME IN CURRENT STATE.

## 2013-08-31 NOTE — Progress Notes (Signed)
[facility] PROGRESS NOTE  Adam Orr:[id number] DOB: 27-May-1966 DOA: 08/30/2013 PCP: No PCP Per Patient  Assessment/Plan: 1. Generalized weakness  1. Likely due to no PO intake other than EtOH 2. Was orthostatic on presentation, s/p banana bag 2. EtOH abuse  1. Patient reminded he needs to quit drinking. 2. Cont with ETOH withdrawal protocol 3. Depression 1. Denies SI/HI. Admitted to not taking antidepressants because of adverse side effects when taking along with ETOH. Pt instead elected to stick with ETOH. 2. Given depression, failure to thrive, AMS,and hx of ETOH abuse, will consult psychiatry for recs  Code Status: Full Family Communication: Pt and brother in room (indicate person spoken with, relationship, and if by phone, the number) Disposition Plan: HH/home PT   Consultants:  Psychiatry  HPI/Subjective: No acute events noted overnight. Pt eager to go home.  Objective: Filed Vitals:   08/31/13 0000 08/31/13 0001 08/31/13 0100 08/31/13 0148  BP: 140/88 140/88 139/94 145/95  Pulse: 58 58 66 57  Temp:    98.2 F (36.8 C)  TempSrc:    Oral  Resp: 15  16 18   Height:    6\' 4"  (1.93 m)  Weight:    57.561 kg (126 lb 14.4 oz)  SpO2: 99%  97%     Intake/Output Summary (Last 24 hours) at 08/31/13 1300 Last data filed at 08/31/13 0700  Gross per 24 hour  Intake    120 ml  Output      0 ml  Net    120 ml   Filed Weights   08/31/13 0148  Weight: 57.561 kg (126 lb 14.4 oz)    Exam:   General:  Awake, in nad, little/no insight to medical condition  Cardiovascular: regular, s1, s2  Respiratory: normal resp effort, no wheezing  Abdomen: soft, nondisnteded  Musculoskeletal: perfused, no clubbing   Data Reviewed: Basic Metabolic Panel:  Recent Labs Lab 08/30/13 1747 08/31/13 0525  NA 140  --   K 3.9  --   CL 97  --   CO2 24  --   GLUCOSE 125*  --   BUN 15  --   CREATININE 0.84  --   CALCIUM 10.7*  --   MG  --  1.7  PHOS  --  3.4    Liver Function Tests:  Recent Labs Lab 08/30/13 1747  AST 45*  ALT 69*  ALKPHOS 50  BILITOT 1.8*  PROT 7.6  ALBUMIN 4.0   No results found for this basename: LIPASE, AMYLASE,  in the last 168 hours  Recent Labs Lab 08/30/13 2310  AMMONIA 12   CBC:  Recent Labs Lab 08/30/13 1747  WBC 6.2  HGB 17.0  HCT 46.5  MCV 97.9  PLT 206   Cardiac Enzymes: No results found for this basename: CKTOTAL, CKMB, CKMBINDEX, TROPONINI,  in the last 168 hours BNP (last 3 results) No results found for this basename: PROBNP,  in the last 8760 hours CBG: No results found for this basename: GLUCAP,  in the last 168 hours  Recent Results (from the past 240 hour(s))  GRAM STAIN     Status: None   Collection Time    08/30/13 10:42 PM      Result Value Range Status   Specimen Description URINE, CATHETERIZED   Final   Special Requests Normal   Final   Gram Stain     Final   Value: CYTOSPIN PREP     WBC PRESENT,BOTH PMN AND MONONUCLEAR  NO ORGANISMS SEEN     Gram Stain Report Called to,Read Back By and Verified With: RN Clancy Gourd, W 2330 08/30/13 Jacolyn Reedy.   Report Status 08/30/2013 FINAL   Final     Studies: Dg Chest 2 View  08/30/2013   *RADIOLOGY REPORT*  Clinical Data: Medical clearance.  Dehydration.  CHEST - 2 VIEW  Comparison: 12/26/2012  Findings: The heart size and pulmonary vascularity are normal and the lungs are clear.  Transvenous defibrillator in place.  No acute osseous abnormality.  IMPRESSION: No acute disease.   Original Report Authenticated By: Francene Boyers, M.D.   Ct Head Wo Contrast  08/30/2013   *RADIOLOGY REPORT*  Clinical Data: Generalized weakness  CT HEAD WITHOUT CONTRAST  Technique:  Contiguous axial images were obtained from the base of the skull through the vertex without contrast.  Comparison: 08/31/2010  Findings: There is no evidence for acute hemorrhage, hydrocephalus, or abnormal extra-axial fluid collection.  No definite CT evidence for acute infarction.   4 mm hyperattenuating focus in the region of the left foramen [place] may represent a tiny colloid cyst.  Diffuse loss of parenchymal volume is consistent with atrophy. Patchy low attenuation in the deep hemispheric and periventricular white matter is nonspecific, but likely reflects chronic microvascular ischemic demyelination.  IMPRESSION: No acute intracranial abnormality.  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  There is no evidence for hydrocephalus.  Follow up non emergent brain MRI without and with contrast could be used to further evaluate, as clinically warranted.  Band-like x-ray   Original Report Authenticated By: Kennith Center, M.D.    Scheduled Meds: . amLODipine  10 mg Oral Daily  . aspirin  325 mg Oral Daily  . feeding supplement  237 mL Oral BID BM  . folic acid  1 mg Oral Daily  . heparin  5,000 Units Subcutaneous Q8H  . multivitamin with minerals  1 tablet Oral Daily  . pantoprazole  40 mg Oral Daily  . [START ON 09/01/2013] pneumococcal 23 valent vaccine  0.5 mL Intramuscular Tomorrow-1000  . sodium chloride  3 mL Intravenous Q12H  . thiamine  100 mg Oral Daily   Or  . thiamine  100 mg Intravenous Daily   Continuous Infusions:   Principal Problem:   Generalized weakness Active Problems:   ETOH abuse  Time spent:  Adam Orr K  [facility] Pager [phone]. If 7PM-7AM, please contact night-coverage at [email or web address], password [email or web address] 08/31/2013, 1:00 PM  LOS: 1 day

## 2013-08-31 NOTE — Consult Note (Signed)
Reason for Consult: depression and confusion Referring Physician: Harper Smoker is an 47 y.o. male.  HPI: Patient is seen and chart. Patient is seen along with his mother at bed side. He has been depressed and drinking alcohol especially wine up to a pint a day over six months. He has history of drinking since he was in [employer] and his dad was alcoholic. Patient has AMI and stopped drinking about two years but started drinking on his birth day. He has been sad, loss of interest, socializing, staying himself, denied suicidal ideation. He has no drug of abuse. He was worked in [employer] about three years ago. Patient does not have any alcohol withdrawal symptoms at this time.    Mental Status Examination: Patient appeared tall slender Caucasian male with a poor grooming, unshaven beard, dressed in hospital gown and has  good eye contact. Patient has  depressed mood and his affect was constricted. He has normal rate, rhythm, and volume of speech. His thought process is linear and goal directed. Patient has denied suicidal, homicidal ideations, intentions or plans. Patient has no evidence of auditory or visual hallucinations, delusions, and paranoia. Patient has  significant memory lapses probably due to alcohol dependence and has poor insight judgment and impulse control.  Past Medical History  Diagnosis Date  . CAD (coronary artery disease)     nonobstructive  . VF (ventricular fibrillation)     arrest  . 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 (doesn't say where LV goes to)   . 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 ICD (implantable cardiac defibrillator) in place     s/p ICD  . Automatic implantable cardioverter-defibrillator in situ   . Anxiety   . Depression     Past Surgical History  Procedure Laterality Date  . Icd implantation      with defibrillation threshold testing. [employer]    History reviewed. No pertinent family history.  Social History:   reports that he has been smoking Cigarettes.  He has been smoking about 0.00 packs per day. He does not have any smokeless tobacco history on file. He reports that he drinks about 50.4 ounces of alcohol per week. He reports that he does not use illicit drugs.  Allergies:  Allergies  Allergen Reactions  . Penicillins Anaphylaxis  . Smallpox Vaccine Swelling    Medications: I have reviewed the patient's current medications.  Results for orders placed during the hospital encounter of 08/30/13 (from the past 48 hour(s))  ACETAMINOPHEN LEVEL     Status: None   Collection Time    08/30/13  5:47 PM      Result Value Range   Acetaminophen (Tylenol), Serum <15.0  10 - 30 ug/mL   Comment:            THERAPEUTIC CONCENTRATIONS VARY     SIGNIFICANTLY. A RANGE OF 10-30     ug/mL MAY BE AN EFFECTIVE     CONCENTRATION FOR MANY PATIENTS.     HOWEVER, SOME ARE BEST TREATED     AT CONCENTRATIONS OUTSIDE THIS     RANGE.     ACETAMINOPHEN CONCENTRATIONS     >150 ug/mL AT 4 HOURS AFTER     INGESTION AND >50 ug/mL AT 12     HOURS AFTER INGESTION ARE     OFTEN ASSOCIATED WITH TOXIC     REACTIONS.  CBC     Status: Abnormal  Collection Time    08/30/13  5:47 PM      Result Value Range   WBC 6.2  4.0 - 10.5 K/uL   RBC 4.75  4.22 - 5.81 MIL/uL   Hemoglobin 17.0  13.0 - 17.0 g/dL   HCT 16.1  09.6 - 04.5 %   MCV 97.9  78.0 - 100.0 fL   MCH 35.8 (*) 26.0 - 34.0 pg   MCHC 36.6 (*) 30.0 - 36.0 g/dL   RDW 40.9  81.1 - 91.4 %   Platelets 206  150 - 400 K/uL  COMPREHENSIVE METABOLIC PANEL     Status: Abnormal   Collection Time    08/30/13  5:47 PM      Result Value Range   Sodium 140  135 - 145 mEq/L   Potassium 3.9  3.5 - 5.1 mEq/L   Chloride 97  96 - 112 mEq/L   CO2 24  19 - 32 mEq/L   Glucose, Bld 125 (*) 70 - 99 mg/dL   BUN 15  6 - 23 mg/dL   Creatinine, Ser 7.82  0.50 - 1.35 mg/dL   Calcium 95.6 (*) 8.4 - 10.5 mg/dL   Total Protein 7.6  6.0 - 8.3 g/dL   Albumin 4.0  3.5 - 5.2 g/dL   AST  45 (*) 0 - 37 U/L   ALT 69 (*) 0 - 53 U/L   Alkaline Phosphatase 50  39 - 117 U/L   Total Bilirubin 1.8 (*) 0.3 - 1.2 mg/dL   GFR calc non Af Amer >90  >90 mL/min   GFR calc Af Amer >90  >90 mL/min   Comment: (NOTE)     The eGFR has been calculated using the CKD EPI equation.     This calculation has not been validated in all clinical situations.     eGFR's persistently <90 mL/min signify possible Chronic Kidney     Disease.  ETHANOL     Status: None   Collection Time    08/30/13  5:47 PM      Result Value Range   Alcohol, Ethyl (B) <11  0 - 11 mg/dL   Comment:            LOWEST DETECTABLE LIMIT FOR     SERUM ALCOHOL IS 11 mg/dL     FOR MEDICAL PURPOSES ONLY  SALICYLATE LEVEL     Status: Abnormal   Collection Time    08/30/13  5:47 PM      Result Value Range   Salicylate Lvl <2.0 (*) 2.8 - 20.0 mg/dL  URINE RAPID DRUG SCREEN (HOSP PERFORMED)     Status: None   Collection Time    08/30/13 10:42 PM      Result Value Range   Opiates NONE DETECTED  NONE DETECTED   Cocaine NONE DETECTED  NONE DETECTED   Benzodiazepines NONE DETECTED  NONE DETECTED   Amphetamines NONE DETECTED  NONE DETECTED   Tetrahydrocannabinol NONE DETECTED  NONE DETECTED   Barbiturates NONE DETECTED  NONE DETECTED   Comment:            DRUG SCREEN FOR MEDICAL PURPOSES     ONLY.  IF CONFIRMATION IS NEEDED     FOR ANY PURPOSE, NOTIFY LAB     WITHIN 5 DAYS.                LOWEST DETECTABLE LIMITS     FOR URINE DRUG SCREEN     Drug Class  Cutoff (ng/mL)     Amphetamine      1000     Barbiturate      200     Benzodiazepine   200     Tricyclics       300     Opiates          300     Cocaine          300     THC              50  URINALYSIS, ROUTINE W REFLEX MICROSCOPIC     Status: Abnormal   Collection Time    08/30/13 10:42 PM      Result Value Range   Color, Urine ORANGE (*) YELLOW   Comment: BIOCHEMICALS MAY BE AFFECTED BY COLOR   APPearance CLEAR  CLEAR   Specific Gravity, Urine 1.026  1.005  - 1.030   pH 6.5  5.0 - 8.0   Glucose, UA NEGATIVE  NEGATIVE mg/dL   Hgb urine dipstick NEGATIVE  NEGATIVE   Bilirubin Urine MODERATE (*) NEGATIVE   Ketones, ur 15 (*) NEGATIVE mg/dL   Protein, ur NEGATIVE  NEGATIVE mg/dL   Urobilinogen, UA 2.0 (*) 0.0 - 1.0 mg/dL   Nitrite NEGATIVE  NEGATIVE   Leukocytes, UA TRACE (*) NEGATIVE  GRAM STAIN     Status: None   Collection Time    08/30/13 10:42 PM      Result Value Range   Specimen Description URINE, CATHETERIZED     Special Requests Normal     Gram Stain       Value: CYTOSPIN PREP     WBC PRESENT,BOTH PMN AND MONONUCLEAR     NO ORGANISMS SEEN     Gram Stain Report Called to,Read Back By and Verified With: RN Clancy Gourd, W 2330 08/30/13 Jacolyn Reedy.   Report Status 08/30/2013 FINAL    URINE MICROSCOPIC-ADD ON     Status: Abnormal   Collection Time    08/30/13 10:42 PM      Result Value Range   Squamous Epithelial / LPF RARE  RARE   WBC, UA 0-2  <3 WBC/hpf   Bacteria, UA FEW (*) RARE  AMMONIA     Status: None   Collection Time    08/30/13 11:10 PM      Result Value Range   Ammonia 12  11 - 60 umol/L   Comment: RESULT CALLED TO, READ BACK BY AND VERIFIED WITHLeigh Aurora RN [phone] 0055 GREEN R     CORRECTED ON 09/04 AT 0056: PREVIOUSLY REPORTED AS <10  MAGNESIUM     Status: None   Collection Time    08/31/13  5:25 AM      Result Value Range   Magnesium 1.7  1.5 - 2.5 mg/dL  PHOSPHORUS     Status: None   Collection Time    08/31/13  5:25 AM      Result Value Range   Phosphorus 3.4  2.3 - 4.6 mg/dL    Dg Chest 2 View  0/03/5408   *RADIOLOGY REPORT*  Clinical Data: Medical clearance.  Dehydration.  CHEST - 2 VIEW  Comparison: 12/26/2012  Findings: The heart size and pulmonary vascularity are normal and the lungs are clear.  Transvenous defibrillator in place.  No acute osseous abnormality.  IMPRESSION: No acute disease.   Original Report Authenticated By: Francene Boyers, M.D.   Ct Head Wo Contrast  08/30/2013   *RADIOLOGY  REPORT*  Clinical Data: Generalized weakness  CT HEAD WITHOUT CONTRAST  Technique:  Contiguous axial images were obtained from the base of the skull through the vertex without contrast.  Comparison: 08/31/2010  Findings: There is no evidence for acute hemorrhage, hydrocephalus, or abnormal extra-axial fluid collection.  No definite CT evidence for acute infarction.  4 mm hyperattenuating focus in the region of the left foramen [place] may represent a tiny colloid cyst.  Diffuse loss of parenchymal volume is consistent with atrophy. Patchy low attenuation in the deep hemispheric and periventricular white matter is nonspecific, but likely reflects chronic microvascular ischemic demyelination.  IMPRESSION: No acute intracranial abnormality.  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  There is no evidence for hydrocephalus.  Follow up non emergent brain MRI without and with contrast could be used to further evaluate, as clinically warranted.  Band-like x-ray   Original Report Authenticated By: Kennith Center, M.D.    Positive for anorexia, anxiety, bad mood, depression, excessive alcohol consumption and sleep disturbance Blood pressure 109/73, pulse 83, temperature 97.9 F (36.6 C), temperature source Oral, resp. rate 16, height 6\' 4"  (1.93 m), weight 57.561 kg (126 lb 14.4 oz), SpO2 100.00%.   Assessment/Plan: Alcohol dependence Substance induced mood disorder  Plan: 1. Patient does not meet criteria of acute psych hospital 2. Start Celexa 20 mg PO QD depression  3. Start Remeron 15 mg PO Qhs and sleep and appetite 4. Refer to out patient psych treatment 5. Appreciate psych consult   Nehemiah Settle., MD 08/31/2013, 4:04 PM

## 2013-08-31 NOTE — Progress Notes (Signed)
CSW provided resources to patient and family was at bedside. CSW asked family to step out and patient stated it was fine for them to stay. CSW provided resources and family asked CSW if patient could get psych eval. CSW reported to RN who paged MD regarding psych eval. CSW also contacted Psych CSW who stated she will follow up with patient after psychiatry is consulted.   Maree Krabbe, MSW, Theresia Majors [phone]

## 2013-08-31 NOTE — H&P (Addendum)
[facility] History and Physical  Adam Orr NWG:[id number] DOB: [birth year] DOA: 08/30/2013  Referring physician: ED PCP: No PCP Per Patient   Chief Complaint: Depression, generalized weakness  HPI: Adam Orr is a 47 y.o. male who presents to the ED with depression, weakness, and essentially failure to thrive due to streight binge drinking for the past 2 weeks in a row.  History is per family.  Per them patient has lost up to 60 lbs in this time period, has had essentially no PO intake at all other than ethanol.  Patient is very depressed, likely in part because he stopped taking his anti-depressant medication because he could not drink EtOH while on this.  In the ED patient was too weak to stand up when asked, although he clearly does not want to be admitted to the medical service and does not want to undergo detox reluctantly agrees to an overnight admission for hydration given his orthostatic hypotension.  He does state clearly numerous times that he does not want to detox and plans to resume drinking after the hospital stay.  Review of Systems: 12 systems reviewed and otherwise negative.  Past Medical History  Diagnosis Date  . CAD (coronary artery disease)     nonobstructive  . VF (ventricular fibrillation)     arrest  . 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 (doesn't say where LV goes to)   . 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 ICD (implantable cardiac defibrillator) in place     s/p ICD   Past Surgical History  Procedure Laterality Date  . Icd implantation      with defibrillation threshold testing. [employer]. Jude   Social History:  reports that he has been smoking Cigarettes.  He has been smoking about 0.00 packs per day. He does not have any smokeless tobacco history on file. He reports that  drinks alcohol. He reports that he does not use illicit drugs.   Allergies  Allergen Reactions  . Penicillins Anaphylaxis  . Smallpox Vaccine  Swelling    History reviewed. No pertinent family history.  Prior to Admission medications   Medication Sig Start Date End Date Taking? Authorizing Provider  amLODipine (NORVASC) 10 MG tablet Take 1 tablet (10 mg total) by mouth daily. 04/19/13 04/19/14 Yes Tonny Bollman, MD  aspirin 325 MG EC tablet Take 325 mg by mouth daily.   Yes Historical Provider, MD  pantoprazole (PROTONIX) 40 MG tablet Take 1 tablet (40 mg total) by mouth daily. 02/16/13  Yes Beatrice Lecher, PA-C   Physical Exam: Filed Vitals:   08/31/13 0000  BP: 140/88  Pulse: 58  Temp:   Resp: 15     General:  NAD, resting comfortably in bed  Eyes: PEERLA EOMI  ENT: mucous membranes moist  Neck: supple w/o JVD  Cardiovascular: RRR w/o MRG  Respiratory: CTA B  Abdomen: soft, nt, nd, bs+  Skin: no rash nor lesion  Musculoskeletal: MAE, full ROM all 4 extremities  Psychiatric: flat tone and affect, confusion noted  Neurologic: Alert, oriented to self, location, not to situation nor time, grossly non-focal   Labs on Admission:  Basic Metabolic Panel:  Recent Labs Lab 08/30/13 1747  NA 140  K 3.9  CL 97  CO2 24  GLUCOSE 125*  BUN 15  CREATININE 0.84  CALCIUM 10.7*   Liver Function Tests:  Recent Labs Lab 08/30/13 1747  AST 45*  ALT 69*  ALKPHOS  50  BILITOT 1.8*  PROT 7.6  ALBUMIN 4.0   No results found for this basename: LIPASE, AMYLASE,  in the last 168 hours  Recent Labs Lab 08/30/13 2310  AMMONIA <10*   CBC:  Recent Labs Lab 08/30/13 1747  WBC 6.2  HGB 17.0  HCT 46.5  MCV 97.9  PLT 206   Cardiac Enzymes: No results found for this basename: CKTOTAL, CKMB, CKMBINDEX, TROPONINI,  in the last 168 hours  BNP (last 3 results) No results found for this basename: PROBNP,  in the last 8760 hours CBG: No results found for this basename: GLUCAP,  in the last 168 hours  Radiological Exams on Admission: Dg Chest 2 View  08/30/2013   *RADIOLOGY REPORT*  Clinical Data: Medical  clearance.  Dehydration.  CHEST - 2 VIEW  Comparison: 12/26/2012  Findings: The heart size and pulmonary vascularity are normal and the lungs are clear.  Transvenous defibrillator in place.  No acute osseous abnormality.  IMPRESSION: No acute disease.   Original Report Authenticated By: Francene Boyers, M.D.   Ct Head Wo Contrast  08/30/2013   *RADIOLOGY REPORT*  Clinical Data: Generalized weakness  CT HEAD WITHOUT CONTRAST  Technique:  Contiguous axial images were obtained from the base of the skull through the vertex without contrast.  Comparison: 08/31/2010  Findings: There is no evidence for acute hemorrhage, hydrocephalus, or abnormal extra-axial fluid collection.  No definite CT evidence for acute infarction.  4 mm hyperattenuating focus in the region of the left foramen [place] may represent a tiny colloid cyst.  Diffuse loss of parenchymal volume is consistent with atrophy. Patchy low attenuation in the deep hemispheric and periventricular white matter is nonspecific, but likely reflects chronic microvascular ischemic demyelination.  IMPRESSION: No acute intracranial abnormality.  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  There is no evidence for hydrocephalus.  Follow up non emergent brain MRI without and with contrast could be used to further evaluate, as clinically warranted.  Band-like x-ray   Original Report Authenticated By: Kennith Center, M.D.    EKG: Independently reviewed.  Assessment/Plan Principal Problem:   Generalized weakness Active Problems:   ETOH abuse   1. Generalized weakness - likely due to no PO intake other than EtOH, as well as orthostatic hypotension, giving some IVF in the form of a banana bag. 2. EtOH abuse - patient needs to quit drinking but doesn't want to. 3. Depression - patient likely would benefit from psych eval and in an ideal world an inpatient psych stay, while he is not actively seeking to harm himself or others, he is most  certainly passively doing a lot of damage to himself through self neglect. 4. Regarding fitness for [employer] duty on next Monday: this patient is clearly not fit for [employer] duty in his present condition as I informed his parents (who will likely ask for a note stating such), he is not oriented to situation or date at this time.   Code Status: Full Code (must indicate code status--if unknown or must be presumed, indicate so) Family Communication: Spoke with family at bedside (indicate person spoken with, if applicable, with phone number if by telephone) Disposition Plan: Admit to obs(indicate anticipated LOS)  Time spent: 70 min  GARDNER, JARED M. [facility] Pager [phone]  If 7PM-7AM, please contact night-coverage [email or web address] Password [facility] 08/31/2013, 12:54 AM

## 2013-08-31 NOTE — Progress Notes (Signed)
Chaplain responded to order requisition to visit patient. Visited with pt and pt's brother. Pt seemed confused: "I don't know what's going on." He said he didn't know why he was here. Brother explained that pt was "disoriented," drank alcohol, had several health problems, and is awaiting a psych eval. Conversed with pt and brother, offering presence, compassionate listening, and emotional support. Both expressed appreciation of visit and inquired how to reach chaplain again.   08/31/13 1300  Clinical Encounter Type  Visited With Patient and family together  Visit Type Initial;Psychological support;Social support  Referral From Nurse  Spiritual Encounters  Spiritual Needs Emotional  Stress Factors  Patient Stress Factors Health changes;Lack of knowledge  Family Stress Factors Exhausted;Lack of knowledge  Maurene Capes, Chaplain Resident

## 2013-08-31 NOTE — Evaluation (Signed)
Physical Therapy Evaluation Patient Details Name: Adam Orr MRN: [id number] DOB: August 19, 1966 Today's Date: 08/31/2013 Time: 7829-5621 PT Time Calculation (min): 20 min  PT Assessment / Plan / Recommendation History of Present Illness  Adam Orr is a 47 y.o. male who presents to the ED with depression, weakness, and essentially failure to thrive due to streight binge drinking for the past 2 weeks in a row.  History is per family.  Per them patient has lost up to 60 lbs in this time period, has had essentially no PO intake at all other than ethanol.  Patient is very depressed, likely in part because he stopped taking his anti-depressant medication because he could not drink EtOH while on this.  Clinical Impression  Pt with decreased awareness for situation and safety. Pt with decreased gait, mobility, balance and below deficits who will benefit from acute therapy to maximize safety, function and balance prior to discharge to decrease burden of care. Pt lives in a garage apt but states he can stay in the house with mother initially.    PT Assessment  Patient needs continued PT services    Follow Up Recommendations  Home health PT;Supervision for mobility/OOB    Does the patient have the potential to tolerate intense rehabilitation      Barriers to Discharge        Equipment Recommendations  Rolling walker with 5" wheels    Recommendations for Other Services     Frequency Min 3X/week    Precautions / Restrictions Precautions Precautions: Fall   Pertinent Vitals/Pain Initially reported stiff right knee but no true pain      Mobility  Bed Mobility Bed Mobility: Supine to Sit Supine to Sit: 6: Modified independent (Device/Increase time);HOB flat;With rails Transfers Transfers: Sit to Stand;Stand to Sit Sit to Stand: 4: Min guard;From bed Stand to Sit: 4: Min guard;To chair/3-in-1;With armrests Details for Transfer Assistance: cueing for hand placement and  safety Ambulation/Gait Ambulation/Gait Assistance: 4: Min assist Ambulation Distance (Feet): 200 Feet Assistive device: Rolling walker Ambulation/Gait Assistance Details: cueing for posture and position in RW with stiff legged walk and cueing for direction Gait Pattern: Step-through pattern;Decreased stride length;Decreased hip/knee flexion - left;Decreased hip/knee flexion - right Gait velocity: WFL but at times too quick for pt safety Stairs: No    Exercises     PT Diagnosis: Difficulty walking;Altered mental status  PT Problem List: Decreased cognition;Decreased activity tolerance;Decreased balance;Decreased mobility;Decreased coordination;Decreased safety awareness;Decreased knowledge of use of DME PT Treatment Interventions: Gait training;Stair training;Functional mobility training;Therapeutic activities;Therapeutic exercise;Patient/family education;Cognitive remediation;Balance training;DME instruction     PT Goals(Current goals can be found in the care plan section) Acute Rehab PT Goals Patient Stated Goal: return home PT Goal Formulation: With patient Time For Goal Achievement: 09/14/13 Potential to Achieve Goals: Fair  Visit Information  Last PT Received On: 08/31/13 Assistance Needed: +1 History of Present Illness: Adam Orr is a 47 y.o. male who presents to the ED with depression, weakness, and essentially failure to thrive due to streight binge drinking for the past 2 weeks in a row.  History is per family.  Per them patient has lost up to 60 lbs in this time period, has had essentially no PO intake at all other than ethanol.  Patient is very depressed, likely in part because he stopped taking his anti-depressant medication because he could not drink EtOH while on this.       Prior Functioning  Home Living Family/patient expects to be discharged to::  Private residence Living Arrangements: Alone (garage apt w mom in house) Available Help at Discharge:  Family;Available 24 hours/day Type of Home: House Home Access: Stairs to enter [employer] of Steps: 2 to mom's house. flight to his apt Home Layout: One level Home Equipment: None Prior Function Level of Independence: Independent Comments: pt works for [employer]: No difficulties    Copywriter, advertising Arousal/Alertness: Awake/alert Behavior During Therapy: Flat affect Overall Cognitive Status: Impaired/Different from baseline Area of Impairment: Safety/judgement;Orientation Orientation Level: Situation Safety/Judgement: Decreased awareness of safety    Extremity/Trunk Assessment Upper Extremity Assessment Upper Extremity Assessment: Defer to OT evaluation Lower Extremity Assessment Lower Extremity Assessment: Generalized weakness Cervical / Trunk Assessment Cervical / Trunk Assessment: Normal   Balance Balance Balance Assessed: Yes Static Sitting Balance Static Sitting - Balance Support: Feet supported;No upper extremity supported Static Sitting - Level of Assistance: 6: Modified independent (Device/Increase time) Static Sitting - Comment/# of Minutes: 3 Static Standing Balance Static Standing - Balance Support: Bilateral upper extremity supported Static Standing - Level of Assistance: 5: Stand by assistance Static Standing - Comment/# of Minutes: 1  End of Session PT - End of Session Equipment Utilized During Treatment: Gait belt Activity Tolerance: Patient tolerated treatment well Patient left: in chair;with call bell/phone within reach;with chair alarm set Nurse Communication: Mobility status  GP     Toney Sang Beth 08/31/2013, 10:50 AM  Delaney Meigs, PT [phone]

## 2013-08-31 NOTE — Progress Notes (Signed)
INITIAL NUTRITION ASSESSMENT  DOCUMENTATION CODES Per approved criteria  -Severe malnutrition in the context of chronic illness -Underweight   INTERVENTION: 1. Ensure Complete po BID, each supplement provides 350 kcal and 13 grams of protein.  2. Please continue to monitor magnesium, potassium, and phosphorus daily for at least 3 days, MD to replete as needed, as pt is at risk for refeeding syndrome given severe malnutrition in the setting of Etoh abuse.   NUTRITION DIAGNOSIS: Malnutrition related to etoh abuse as evidenced by weight loss and muscle/fat wasting.   Goal: PO intake to meet >/=90% estimated nutrition needs.   Monitor:  PO intake, weight trends, labs   Reason for Assessment: Malnutrition Screening Tool  47 y.o. male  Admitting Dx: Generalized weakness  ASSESSMENT: Pt admitted for FTT with Etoh abuse for 2 weeks with out any other oral intake.   Pt states that he was eating at home, was unable to provide any specific details. Pt did not believe current weight in computer, but was unable to get back in the bed to be re-weighed. Pt reports usual weight of 180 lbs, and thinks he has lost about 20 lbs in the past "few years"  Weight hx shows 65 lb weight loss in the past 18 months, 28 lbs of it in the past 5 months (18% body weight in 5 months).  Pt minimally interested in talking to RD about weight or oral intake. Agreeable to trying oral supplements.  Pt had visible muscle wasting at the temples and clavicles and fat wasting visible in the orbital region.   Pt meets criteria for severe malnutrition in the context of chronic illness 2.2 weight los of 18% body weight in 5 months and meeting <75% estimated nutrition needs for >/1 month.  Given severe malnutrition and Etoh abuse, pt is at increased risk for refeeding syndrome. Recommend monitor Pag, Phos and K+ x3 days. Currently all WNL.   Height: Ht Readings from Last 1 Encounters:  08/31/13 6\' 4"  (1.93 m)     Weight: Wt Readings from Last 1 Encounters:  08/31/13 126 lb 14.4 oz (57.561 kg)    Ideal Body Weight: 202 lbs   % Ideal Body Weight: 63%  Wt Readings from Last 10 Encounters:  08/31/13 126 lb 14.4 oz (57.561 kg)  04/19/13 154 lb (69.854 kg)  02/16/13 156 lb 12.8 oz (71.124 kg)  12/16/12 159 lb (72.122 kg)  08/30/12 165 lb 12.8 oz (75.206 kg)  03/17/12 191 lb 6.4 oz (86.818 kg)  12/17/11 193 lb 12.8 oz (87.907 kg)  06/10/11 181 lb (82.101 kg)  12/10/10 179 lb (81.194 kg)  11/19/10 174 lb 8 oz (79.153 kg)    Usual Body Weight: 156 lbs   % Usual Body Weight: 81%  BMI:  Body mass index is 15.45 kg/(m^2). Underweight   Estimated Nutritional Needs: Kcal: 2000-2200 Protein: 60-70 gm  Fluid: 2-2.2 L   Skin: intact   Diet Order: General  EDUCATION NEEDS: -No education needs identified at this time  No intake or output data in the 24 hours ending 08/31/13 0908  Last BM: PTA    Labs:   Recent Labs Lab 08/30/13 1747 08/31/13 0525  NA 140  --   K 3.9  --   CL 97  --   CO2 24  --   BUN 15  --   CREATININE 0.84  --   CALCIUM 10.7*  --   MG  --  1.7  PHOS  --  3.4  GLUCOSE  125*  --     CBG (last 3)  No results found for this basename: GLUCAP,  in the last 72 hours  Scheduled Meds: . amLODipine  10 mg Oral Daily  . aspirin  325 mg Oral Daily  . folic acid  1 mg Oral Daily  . heparin  5,000 Units Subcutaneous Q8H  . multivitamin with minerals  1 tablet Oral Daily  . pantoprazole  40 mg Oral Daily  . [START ON 09/01/2013] pneumococcal 23 valent vaccine  0.5 mL Intramuscular Tomorrow-1000  . sodium chloride  3 mL Intravenous Q12H  . thiamine  100 mg Oral Daily   Or  . thiamine  100 mg Intravenous Daily    Continuous Infusions:   Past Medical History  Diagnosis Date  . CAD (coronary artery disease)     nonobstructive  . VF (ventricular fibrillation)     arrest  . 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 (doesn't say where  LV goes to)   . 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 ICD (implantable cardiac defibrillator) in place     s/p ICD  . Automatic implantable cardioverter-defibrillator in situ   . Anxiety   . Depression     Past Surgical History  Procedure Laterality Date  . Icd implantation      with defibrillation threshold testing. [employer]    [place] [place] [address], [place] Pager [phone] After Hours pager [phone]

## 2013-08-31 NOTE — Progress Notes (Signed)
Clinical Social Work Department BRIEF PSYCHOSOCIAL ASSESSMENT 08/31/2013  Patient:  Adam Orr, Adam Orr     Account Number:  [id number]     Admit date:  08/30/2013  Clinical Social Worker:  Carren Rang  Date/Time:  08/31/2013 12:16 PM  Referred by:  RN  Date Referred:  08/31/2013 Referred for  Substance Abuse   Other Referral:   Interview type:  Patient Other interview type:    PSYCHOSOCIAL DATA Living Status:  FAMILY Admitted from facility:   Level of care:   Primary support name:  Traven Davids [phone] Primary support relationship to patient:  FAMILY Degree of support available:   Adequate    CURRENT CONCERNS Current Concerns  Substance Abuse   Other Concerns:   Depression/Alcohol Use    SOCIAL WORK ASSESSMENT / PLAN Met with patient to discuss alcohol use- pt reports that he was drunk last night and doesn't understand why he is in the hospital. Patient states they don't know if they are willing to stop drinking, but does want some resources from CSW. Patient states he does not have a lot of support, besides his mom. He states that his children live further away and he does not have contact with them. Patient appeared to be sad and depressed. CSW asked patient if he wanted resources for counseling and patient states he would.  Patient also stated that he does not understand what is keeping him in the hospital and "no one has told me what is going on with me and why I am still here."  CSW told patient she would come back after patient eats lunch with some resources and would be able to talk to patient for emotional support.   Assessment/plan status:  Information/Referral to [facility] Other assessment/ plan:   Information/referral to community resources:   Alcohol Resources/Counseling    PATIENT'S/FAMILY'S RESPONSE TO PLAN OF CARE: Patient states he does drink often and has quit in the past. Patient states he has been in AA before and "he doesn't think it  helped."       Maree Krabbe, MSW, [employer] [phone]

## 2013-08-31 NOTE — Progress Notes (Addendum)
Occupational Therapy Evaluation Patient Details Name: Adam Orr MRN: [id number] DOB: 1966-05-31 Today's Date: 08/31/2013 Time: 8657-8469 OT Time Calculation (min): 26 min  OT Assessment / Plan / Recommendation History of present illness Adam Orr is a 47 y.o. male who presents to the ED with depression, weakness, and essentially failure to thrive due to streight binge drinking for the past 2 weeks in a row.  History is per family.  Per them patient has lost up to 60 lbs in this time period, has had essentially no PO intake at all other than ethanol.  Patient is very depressed, likely in part because he stopped taking his anti-depressant medication because he could not drink EtOH while on this.   Clinical Impression   Pt with significant functional deficits and is not safe to D/C home at this time. Pt is at high risk for falls, is confused and requires min to mod A for basic ADL and max assist for IADL tasks. PTA, pt was not bathing or changing clothes frequently and having hallucinations.  If pt does not D/C to inpt behavioral health program, rec that pt D/C to SNFinitially, then to ETOH rehab program. Pt with flat affect and apparent depressed mood during eval. Rec psych consult.    OT Assessment  Patient needs continued OT Services    Follow Up Recommendations  SNF;Other (comment) (behavioral health)    Barriers to Discharge      Equipment Recommendations    3 in 1. RW   Recommendations for Other Services  Psych consult  Frequency  Min 2X/week    Precautions / Restrictions Precautions Precautions: Fall Restrictions Weight Bearing Restrictions: No   Pertinent Vitals/Pain no apparent distress     ADL  Eating/Feeding: Other (comment) (poor PO intake) Where Assessed - Eating/Feeding: Chair Grooming: Set up;Supervision/safety Where Assessed - Grooming: Supported standing Upper Body Bathing: Supervision/safety;Set up Where Assessed - Upper Body Bathing: Supported  sitting Lower Body Bathing: Minimal assistance Where Assessed - Lower Body Bathing: Supported sit to stand Upper Body Dressing: Supervision/safety;Set up Where Assessed - Upper Body Dressing: Supported sitting Lower Body Dressing: Moderate assistance Where Assessed - Lower Body Dressing: Supported sit to Pharmacist, hospital: Moderate assistance Toilet Transfer Method: Sit to stand;Stand pivot Toilet Transfer Equipment: Regular height toilet Toileting - Clothing Manipulation and Hygiene: Minimal assistance Where Assessed - Toileting Clothing Manipulation and Hygiene: Sit to stand from 3-in-1 or toilet Equipment Used: Gait belt;Rolling walker Transfers/Ambulation Related to ADLs: Min A @ RW level ADL Comments: dissheveled appearance. family staes that pt does not bath frequently or changes clothes often at home Max assist with IADL tasks.    OT Diagnosis: Generalized weakness;Cognitive deficits;Ataxia;Altered mental status  OT Problem List: Decreased strength;Decreased activity tolerance;Impaired balance (sitting and/or standing);Decreased coordination;Decreased cognition;Decreased safety awareness;Decreased knowledge of use of DME or AE;Decreased knowledge of precautions;Cardiopulmonary status limiting activity OT Treatment Interventions: Self-care/ADL training;Therapeutic exercise;Energy conservation;DME and/or AE instruction;Therapeutic activities;Cognitive remediation/compensation;Patient/family education;Balance training   OT Goals(Current goals can be found in the care plan section) Acute Rehab OT Goals Patient Stated Goal: return home OT Goal Formulation: With family Time For Goal Achievement: 09/14/13 Potential to Achieve Goals: Fair  Visit Information  Last OT Received On: 08/31/13 Assistance Needed: +1 History of Present Illness: Adam Orr is a 47 y.o. male who presents to the ED with depression, weakness, and essentially failure to thrive due to streight binge drinking  for the past 2 weeks in a row.  History is per family.  Per  them patient has lost up to 60 lbs in this time period, has had essentially no PO intake at all other than ethanol.  Patient is very depressed, likely in part because he stopped taking his anti-depressant medication because he could not drink EtOH while on this.       Prior Functioning     Home Living Family/patient expects to be discharged to:: Private residence Living Arrangements: Alone (garage apt w mom in house) Available Help at Discharge: Family;Available 24 hours/day Type of Home: House Home Access: Stairs to enter [employer] of Steps: 2 to mom's house. flight to his apt Home Layout: One level Home Equipment: None Prior Function Level of Independence: Independent Comments: pt works for [employer]: No difficulties         Vision/Perception     Copywriter, advertising Arousal/Alertness: Awake/alert Behavior During Therapy: Flat affect Overall Cognitive Status: Impaired/Different from baseline Area of Impairment: Safety/judgement;Orientation Orientation Level: Situation. Thinks he is at [facility] Memory: Decreased short-term memory Safety/Judgement: Decreased awareness of safety / poor insight   Extremity/Trunk Assessment Cervical / Trunk Assessment Cervical / Trunk Assessment: Normal     Mobility Bed Mobility Supine to Sit: Not tested (comment) Transfers Transfers: Sit to Stand;Stand to Sit Sit to Stand: 4: Min assist;From chair/3-in-1 Stand to Sit: To chair/3-in-1;With armrests;3: Mod assist Details for Transfer Assistance: poor uncontrolled descent to chair     Exercise     Balance Balance Balance Assessed: Yes Static Sitting Balance Static Sitting - Balance Support: Feet supported;No upper extremity supported Static Sitting - Level of Assistance: 5: Stand by assistance;Other (comment) (R lateral lean) Static Standing Balance Static Standing -  Balance Support: Bilateral upper extremity supported Static Standing - Level of Assistance: 4: Min assist Dynamic Standing Balance Dynamic Standing - Balance Support: Bilateral upper extremity supported;During functional activity Dynamic Standing - Level of Assistance: 4: Min assist LOB x3 posteriorly during funcitonal acitivities   End of Session OT - End of Session Equipment Utilized During Treatment: Gait belt;Rolling walker Activity Tolerance: Patient limited by fatigue Patient left: in chair;with call bell/phone within reach;with family/visitor present Nurse Communication: Mobility status  GO     Shivank Pinedo,HILLARY 08/31/2013, 3:01 PM [facility], OTR/L  [phone] 08/31/2013

## 2013-08-31 NOTE — Progress Notes (Signed)
UR Completed.  Rosbel Buckner Jane [phone] 08/31/2013  

## 2013-09-01 DIAGNOSIS — F332 Major depressive disorder, recurrent severe without psychotic features: Secondary | ICD-10-CM

## 2013-09-01 MED ORDER — TAMSULOSIN HCL 0.4 MG PO CAPS
0.4000 mg | ORAL_CAPSULE | Freq: Every day | ORAL | Status: DC
  Administered 2013-09-01 – 2013-09-12 (×11): 0.4 mg via ORAL
  Filled 2013-09-01 (×12): qty 1

## 2013-09-01 NOTE — Progress Notes (Signed)
[facility] PROGRESS NOTE  Adam Orr:[id number] DOB: 1966-10-21 DOA: 08/30/2013 PCP: No PCP Per Patient  Assessment/Plan: 1. Generalized weakness  1. Likely due poor PO intake and malnutrition 2. Was orthostatic on presentation, s/p banana bag 2. EtOH abuse  1. Patient reminded he needs to quit drinking. 2. Cont with ETOH withdrawal protocol 3. Psych SW following 3. Depression 1. Denies SI/HI. 2. Appreciate Psychiatry recs 3. Now on Celexa and remeron 4. Recs for outpatient psych treatment  Code Status: Full Family Communication: Pt and brother in room (indicate person spoken with, relationship, and if by phone, the number) Disposition Plan: HH/home PT, pending   Consultants:  Psychiatry  HPI/Subjective: No acute events noted overnight. Pt remains with short term memory deficits. Objective: Filed Vitals:   08/31/13 1353 08/31/13 1954 09/01/13 0402 09/01/13 1010  BP: 109/73 99/72 119/79 103/76  Pulse: 83 78 85   Temp: 97.9 F (36.6 C) 98.3 F (36.8 C) 97.8 F (36.6 C)   TempSrc: Oral Oral Oral   Resp: 16 18 18    Height:      Weight:      SpO2: 100% 100% 100%     Intake/Output Summary (Last 24 hours) at 09/01/13 1046 Last data filed at 09/01/13 0849  Gross per 24 hour  Intake    720 ml  Output    850 ml  Net   -130 ml   Filed Weights   08/31/13 0148  Weight: 57.561 kg (126 lb 14.4 oz)    Exam:   General:  Awake, in nad, little/no insight to medical condition  Cardiovascular: regular, s1, s2  Respiratory: normal resp effort, no wheezing  Abdomen: soft, nondisnteded  Musculoskeletal: perfused, no clubbing   Data Reviewed: Basic Metabolic Panel:  Recent Labs Lab 08/30/13 1747 08/31/13 0525  NA 140  --   K 3.9  --   CL 97  --   CO2 24  --   GLUCOSE 125*  --   BUN 15  --   CREATININE 0.84  --   CALCIUM 10.7*  --   MG  --  1.7  PHOS  --  3.4   Liver Function Tests:  Recent Labs Lab 08/30/13 1747  AST 45*  ALT 69*   ALKPHOS 50  BILITOT 1.8*  PROT 7.6  ALBUMIN 4.0   No results found for this basename: LIPASE, AMYLASE,  in the last 168 hours  Recent Labs Lab 08/30/13 2310  AMMONIA 12   CBC:  Recent Labs Lab 08/30/13 1747  WBC 6.2  HGB 17.0  HCT 46.5  MCV 97.9  PLT 206   Cardiac Enzymes: No results found for this basename: CKTOTAL, CKMB, CKMBINDEX, TROPONINI,  in the last 168 hours BNP (last 3 results) No results found for this basename: PROBNP,  in the last 8760 hours CBG: No results found for this basename: GLUCAP,  in the last 168 hours  Recent Results (from the past 240 hour(s))  GRAM STAIN     Status: None   Collection Time    08/30/13 10:42 PM      Result Value Range Status   Specimen Description URINE, CATHETERIZED   Final   Special Requests Normal   Final   Gram Stain     Final   Value: CYTOSPIN PREP     WBC PRESENT,BOTH PMN AND MONONUCLEAR     NO ORGANISMS SEEN     Gram Stain Report Called to,Read Back By and Verified With: RN Clancy Gourd, W 2330  08/30/13 Dyann Kief M.   Report Status 08/30/2013 FINAL   Final     Studies: Dg Chest 2 View  08/30/2013   *RADIOLOGY REPORT*  Clinical Data: Medical clearance.  Dehydration.  CHEST - 2 VIEW  Comparison: 12/26/2012  Findings: The heart size and pulmonary vascularity are normal and the lungs are clear.  Transvenous defibrillator in place.  No acute osseous abnormality.  IMPRESSION: No acute disease.   Original Report Authenticated By: Francene Boyers, M.D.   Ct Head Wo Contrast  08/30/2013   *RADIOLOGY REPORT*  Clinical Data: Generalized weakness  CT HEAD WITHOUT CONTRAST  Technique:  Contiguous axial images were obtained from the base of the skull through the vertex without contrast.  Comparison: 08/31/2010  Findings: There is no evidence for acute hemorrhage, hydrocephalus, or abnormal extra-axial fluid collection.  No definite CT evidence for acute infarction.  4 mm hyperattenuating focus in the region of the left foramen [place] may  represent a tiny colloid cyst.  Diffuse loss of parenchymal volume is consistent with atrophy. Patchy low attenuation in the deep hemispheric and periventricular white matter is nonspecific, but likely reflects chronic microvascular ischemic demyelination.  IMPRESSION: No acute intracranial abnormality.  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  There is no evidence for hydrocephalus.  Follow up non emergent brain MRI without and with contrast could be used to further evaluate, as clinically warranted.  Band-like x-ray   Original Report Authenticated By: Kennith Center, M.D.    Scheduled Meds: . amLODipine  10 mg Oral Daily  . aspirin  325 mg Oral Daily  . citalopram  20 mg Oral Daily  . feeding supplement  237 mL Oral BID BM  . folic acid  1 mg Oral Daily  . heparin  5,000 Units Subcutaneous Q8H  . mirtazapine  15 mg Oral QHS  . multivitamin with minerals  1 tablet Oral Daily  . nicotine  14 mg Transdermal Daily  . pantoprazole  40 mg Oral Daily  . sodium chloride  3 mL Intravenous Q12H  . thiamine  100 mg Oral Daily   Or  . thiamine  100 mg Intravenous Daily   Continuous Infusions:   Principal Problem:   Generalized weakness Active Problems:   ETOH abuse  Time spent:  Daiden Coltrane K  [facility] Pager [phone]. If 7PM-7AM, please contact night-coverage at [email or web address], password [facility] 09/01/2013, 10:46 AM  LOS: 2 days

## 2013-09-01 NOTE — Progress Notes (Addendum)
CSW spoke with patient and mother at bedside. Patient stated that he does not even remember psych social worker coming into the room and requests that patient's mom be involved. Patients mother stated that "he needs to detox and be able to go to an inpatient facility." Patient stated, "he has no idea what is going on." CSW stated she will page MD to update him on what patient and family are explaining. Patient;s mother states she can not take him home in this state of mind and he has jury duty Monday "and is unable to go with him being this confused." CSW spoke to MD who stated he will re consult Psychiatry. CSW will update when new information arises.  Maree Krabbe, MSW, [facility] [phone]

## 2013-09-01 NOTE — Progress Notes (Signed)
Follow Up Nutrition Note  DOCUMENTATION CODES Per approved criteria  -Severe malnutrition in the context of chronic illness -Underweight   INTERVENTION: 1. Continue Ensure Complete po BID, each supplement provides 350 kcal and 13 grams of protein.  2. Please continue to monitor magnesium, potassium, and phosphorus daily for at least 3 days, MD to replete as needed, as pt is at risk for refeeding syndrome given severe malnutrition in the setting of Etoh abuse.   NUTRITION DIAGNOSIS: Malnutrition related to etoh abuse as evidenced by weight loss and muscle/fat wasting.   Goal: PO intake to meet >/=90% estimated nutrition needs.   Monitor:  PO intake, weight trends, labs    ASSESSMENT: Pt admitted for FTT with Etoh abuse for 2 weeks with out any other oral intake.  Pt reports intake is normal. Drinking supplements. Re-weighed pt in the bed, 135 lbs. Question accuracy of weights.  Mag, Phos and K+ are WNL at this time. Recommend continue to monitor these labs for a total of 3 days.    Height: Ht Readings from Last 1 Encounters:  08/31/13 6\' 4"  (1.93 m)    Weight: Wt Readings from Last 1 Encounters:  08/31/13 126 lb 14.4 oz (57.561 kg)    BMI:  Body mass index is 15.45 kg/(m^2). Underweight   Estimated Nutritional Needs: Kcal: 2000-2200 Protein: 60-70 gm  Fluid: 2-2.2 L   Skin: intact   Diet Order: General  EDUCATION NEEDS: -No education needs identified at this time   Intake/Output Summary (Last 24 hours) at 09/01/13 1132 Last data filed at 09/01/13 0849  Gross per 24 hour  Intake    720 ml  Output    850 ml  Net   -130 ml    Last BM: PTA    Labs:   Recent Labs Lab 08/30/13 1747 08/31/13 0525  NA 140  --   K 3.9  --   CL 97  --   CO2 24  --   BUN 15  --   CREATININE 0.84  --   CALCIUM 10.7*  --   MG  --  1.7  PHOS  --  3.4  GLUCOSE 125*  --     CBG (last 3)  No results found for this basename: GLUCAP,  in the last 72 hours  Scheduled  Meds: . amLODipine  10 mg Oral Daily  . aspirin  325 mg Oral Daily  . citalopram  20 mg Oral Daily  . feeding supplement  237 mL Oral BID BM  . folic acid  1 mg Oral Daily  . heparin  5,000 Units Subcutaneous Q8H  . mirtazapine  15 mg Oral QHS  . multivitamin with minerals  1 tablet Oral Daily  . nicotine  14 mg Transdermal Daily  . pantoprazole  40 mg Oral Daily  . sodium chloride  3 mL Intravenous Q12H  . thiamine  100 mg Oral Daily   Or  . thiamine  100 mg Intravenous Daily    Continuous Infusions:  none    Clarene Duke RD, LDN Pager [phone] After Hours pager [phone]

## 2013-09-01 NOTE — Progress Notes (Signed)
Pt. Unable to vocalize last void. Bladder scan showed 433. MD notified. New orders received to start flomax. Will continue to monitor. Genevive Bi, RN

## 2013-09-01 NOTE — Clinical Social Work Psych Note (Signed)
Psych CSW assessed pt at bedside.  Psychiatrist recommends outpatient f/u.  [facility].  Pt denied the need for additional outpatient therapies to be set up.  Psych CSW reviewed the pt disposition with unit CSW.  Psych CSW is signing off at this time.  If further psych needs should arise, please re-consult.  Full assessment to follow.  Vickii Penna, LCSWA [phone]  Clinical Social Work

## 2013-09-02 DIAGNOSIS — F09 Unspecified mental disorder due to known physiological condition: Secondary | ICD-10-CM

## 2013-09-02 NOTE — ED Provider Notes (Signed)
I saw and evaluated the patient, reviewed the resident's note and I agree with the findings and plan and agree with their ECG interpretation. Patient here to help stop drinking. Has also been having some hallucinations. Hasn't been able to manage at home. Will admit  Adam Orr. Rubin Payor, MD 09/02/13 [phone]
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
## 2013-09-02 NOTE — Progress Notes (Signed)
[facility] PROGRESS NOTE  Adam Orr ZOX:[id number] DOB: 01-Aug-1966 DOA: 08/30/2013 PCP: No PCP Per Patient  Assessment/Plan: 1. Generalized weakness  1. Likely due poor PO intake and malnutrition 2. Was orthostatic on presentation, s/p banana bag 3. OT recommends SNF 2. EtOH abuse  1. Patient again reminded he needs to quit drinking. 2. Cont with ETOH withdrawal protocol 3. Psych re-consulted 4. Discussed case with the patient's mother yesterday. Family is wanting patient to participate in ETOH detox. 5. Pt does not have insight to his medical condition and therefore I do not feel he is able to make his own decisions. Psych re-consulted for official determination of competency. Hopefully his family would be able to aide in his ultimate disposition. 3. Depression 1. Denies SI/HI. 2. Appreciate Psychiatry recs 3. Now on Celexa and remeron 4. Recent recs for outpatient psych treatment  Code Status: Full Family Communication: Pt and brother in room (indicate person spoken with, relationship, and if by phone, the number) Disposition Plan: HH/home PT, pending  Consultants:  Psychiatry  HPI/Subjective: No events overnight. Objective: Filed Vitals:   09/01/13 1216 09/01/13 1418 09/01/13 2009 09/02/13 0421  BP: 114/76 106/73 105/80 109/69  Pulse: 68 99 98 82  Temp:  98.3 F (36.8 C) 99 F (37.2 C) 98 F (36.7 C)  TempSrc:  Oral Oral Oral  Resp:  18 18 19   Height:      Weight:      SpO2:  100% 100% 99%    Intake/Output Summary (Last 24 hours) at 09/02/13 1324 Last data filed at 09/02/13 0434  Gross per 24 hour  Intake    483 ml  Output    850 ml  Net   -367 ml   Filed Weights   08/31/13 0148  Weight: 57.561 kg (126 lb 14.4 oz)    Exam:   General:  Awake, in nad, little/no insight to medical condition  Cardiovascular: regular, s1, s2  Respiratory: normal resp effort, no wheezing  Abdomen: soft, nondisnteded  Musculoskeletal: perfused, no clubbing    Data Reviewed: Basic Metabolic Panel:  Recent Labs Lab 08/30/13 1747 08/31/13 0525  NA 140  --   K 3.9  --   CL 97  --   CO2 24  --   GLUCOSE 125*  --   BUN 15  --   CREATININE 0.84  --   CALCIUM 10.7*  --   MG  --  1.7  PHOS  --  3.4   Liver Function Tests:  Recent Labs Lab 08/30/13 1747  AST 45*  ALT 69*  ALKPHOS 50  BILITOT 1.8*  PROT 7.6  ALBUMIN 4.0   No results found for this basename: LIPASE, AMYLASE,  in the last 168 hours  Recent Labs Lab 08/30/13 2310  AMMONIA 12   CBC:  Recent Labs Lab 08/30/13 1747  WBC 6.2  HGB 17.0  HCT 46.5  MCV 97.9  PLT 206   Cardiac Enzymes: No results found for this basename: CKTOTAL, CKMB, CKMBINDEX, TROPONINI,  in the last 168 hours BNP (last 3 results) No results found for this basename: PROBNP,  in the last 8760 hours CBG: No results found for this basename: GLUCAP,  in the last 168 hours  Recent Results (from the past 240 hour(s))  GRAM STAIN     Status: None   Collection Time    08/30/13 10:42 PM      Result Value Range Status   Specimen Description URINE, CATHETERIZED   Final  Special Requests Normal   Final   Gram Stain     Final   Value: CYTOSPIN PREP     WBC PRESENT,BOTH PMN AND MONONUCLEAR     NO ORGANISMS SEEN     Gram Stain Report Called to,Read Back By and Verified With: RN Clancy Gourd, W 2330 08/30/13 Jacolyn Reedy.   Report Status 08/30/2013 FINAL   Final     Studies: No results found.  Scheduled Meds: . amLODipine  10 mg Oral Daily  . aspirin  325 mg Oral Daily  . citalopram  20 mg Oral Daily  . feeding supplement  237 mL Oral BID BM  . folic acid  1 mg Oral Daily  . heparin  5,000 Units Subcutaneous Q8H  . mirtazapine  15 mg Oral QHS  . multivitamin with minerals  1 tablet Oral Daily  . nicotine  14 mg Transdermal Daily  . pantoprazole  40 mg Oral Daily  . sodium chloride  3 mL Intravenous Q12H  . tamsulosin  0.4 mg Oral Daily  . thiamine  100 mg Oral Daily   Or  . thiamine  100 mg  Intravenous Daily   Continuous Infusions:   Principal Problem:   Generalized weakness Active Problems:   ETOH abuse  Time spent:  CHIU, STEPHEN K  [facility] Pager [phone]. If 7PM-7AM, please contact night-coverage at [email or web address], password [facility] 09/02/2013, 1:24 PM  LOS: 3 days

## 2013-09-03 DIAGNOSIS — Z95810 Presence of automatic (implantable) cardiac defibrillator: Secondary | ICD-10-CM

## 2013-09-03 NOTE — Progress Notes (Signed)
[facility] PROGRESS NOTE  Adam DETTORE WUJ:[id number] DOB: 1966/03/25 DOA: 08/30/2013 PCP: No PCP Per Patient  Assessment/Plan: 1. Generalized weakness  1. Likely due poor PO intake and malnutrition 2. Was orthostatic on presentation, s/p banana bag, bp currently stable 3. OT recommended SNF 4. Will also discuss w/ psych SW 2. EtOH abuse  1. Patient again reminded he needs to quit drinking. 2. Cont with ETOH withdrawal protocol, although likely outside window now for DT's 3. Psych was re-consulted - appreciate recs 4. Pt lacks decision making capacity. Hopefully his family would be able to aide in his ultimate disposition. 3. Confusion 1. Persistently confused 2. Suspect related to chronic ETOH abuse 3. On thiamine 4. Unable to make own decisions as per above 4. Depression 1. Denies SI/HI. 2. Appreciate Psychiatry recs 3. Now on Celexa and remeron 4. Recent recs for outpatient psych treatment  Code Status: Full Family Communication: Pt and brother in room (indicate person spoken with, relationship, and if by phone, the number) Disposition Plan: HH/home PT, pending  Consultants:  Psychiatry  HPI/Subjective: No events overnight. Objective: Filed Vitals:   09/02/13 0421 09/02/13 1334 09/02/13 1953 09/03/13 0433  BP: 109/69 82/50 105/71 110/73  Pulse: 82 84 90 64  Temp: 98 F (36.7 C) 98.8 F (37.1 C) 98.3 F (36.8 C) 98.6 F (37 C)  TempSrc: Oral Oral Oral   Resp: 19 18 20 19   Height:      Weight:      SpO2: 99% 99% 100% 99%    Intake/Output Summary (Last 24 hours) at 09/03/13 0840 Last data filed at 09/03/13 0149  Gross per 24 hour  Intake      0 ml  Output   1400 ml  Net  -1400 ml   Filed Weights   08/31/13 0148  Weight: 57.561 kg (126 lb 14.4 oz)    Exam:   General:  Awake, in nad, little/no insight to medical condition  Cardiovascular: regular, s1, s2  Respiratory: normal resp effort, no wheezing  Abdomen: soft,  nondisnteded  Musculoskeletal: perfused, no clubbing   Data Reviewed: Basic Metabolic Panel:  Recent Labs Lab 08/30/13 1747 08/31/13 0525  NA 140  --   K 3.9  --   CL 97  --   CO2 24  --   GLUCOSE 125*  --   BUN 15  --   CREATININE 0.84  --   CALCIUM 10.7*  --   MG  --  1.7  PHOS  --  3.4   Liver Function Tests:  Recent Labs Lab 08/30/13 1747  AST 45*  ALT 69*  ALKPHOS 50  BILITOT 1.8*  PROT 7.6  ALBUMIN 4.0   No results found for this basename: LIPASE, AMYLASE,  in the last 168 hours  Recent Labs Lab 08/30/13 2310  AMMONIA 12   CBC:  Recent Labs Lab 08/30/13 1747  WBC 6.2  HGB 17.0  HCT 46.5  MCV 97.9  PLT 206   Cardiac Enzymes: No results found for this basename: CKTOTAL, CKMB, CKMBINDEX, TROPONINI,  in the last 168 hours BNP (last 3 results) No results found for this basename: PROBNP,  in the last 8760 hours CBG: No results found for this basename: GLUCAP,  in the last 168 hours  Recent Results (from the past 240 hour(s))  GRAM STAIN     Status: None   Collection Time    08/30/13 10:42 PM      Result Value Range Status   Specimen Description  URINE, CATHETERIZED   Final   Special Requests Normal   Final   Gram Stain     Final   Value: CYTOSPIN PREP     WBC PRESENT,BOTH PMN AND MONONUCLEAR     NO ORGANISMS SEEN     Gram Stain Report Called to,Read Back By and Verified With: RN Clancy Gourd, W 2330 08/30/13 Jacolyn Reedy.   Report Status 08/30/2013 FINAL   Final     Studies: No results found.  Scheduled Meds: . amLODipine  10 mg Oral Daily  . aspirin  325 mg Oral Daily  . citalopram  20 mg Oral Daily  . feeding supplement  237 mL Oral BID BM  . folic acid  1 mg Oral Daily  . heparin  5,000 Units Subcutaneous Q8H  . mirtazapine  15 mg Oral QHS  . multivitamin with minerals  1 tablet Oral Daily  . nicotine  14 mg Transdermal Daily  . pantoprazole  40 mg Oral Daily  . sodium chloride  3 mL Intravenous Q12H  . tamsulosin  0.4 mg Oral Daily  .  thiamine  100 mg Oral Daily   Or  . thiamine  100 mg Intravenous Daily   Continuous Infusions:   Principal Problem:   Generalized weakness Active Problems:   ETOH abuse  Time spent:  CHIU, STEPHEN K  [facility] Pager [phone]. If 7PM-7AM, please contact night-coverage at [email or web address], password [facility] 09/03/2013, 8:40 AM  LOS: 4 days

## 2013-09-04 LAB — PSA: PSA: 0.7 ng/mL (ref <=4.00)
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
## 2013-09-04 NOTE — Clinical Social Work Psych Note (Addendum)
12:29pm - Psych CSW contacted Merry Proud, Artist and left message including pt mother's cell phone (per [employer] request).  Psych CSW requested FC to follow up with mother re: Medicaid application and process.  Psych CSW reviewed chart, spoke to unit CSW along with pt mother, Clydie Braun.  Psychiatrist documented that pt did not meet criteria for inpatient psychiatric treatment.  Psychiatry is recommending skilled placement as pt is unable to care for himself.  Mother confirmed that pt is unable to care for himself.  Mother stated that pt lives in an upstairs apartment (on her land) and cannot walk up and down the stairs.  Mother reports that pt has grown increasingly weak.  Mother is requesting skilled placement to obtain strength in lower extremities prior to going home.  Psych CSW gave report to unit CSW.  Unit CSW will continue to assist with placement needs.  If pt were to d/c home, Psych CSW will provide a listing of psychiatric resources to pt mother for outpatient follow-up.  If pt were to be d/c to skilled nursing, pt could be followed by psychiatrist at [facility].    Psych CSW will contact financial counseling per pt mother request to determine if a medicaid application has been completed.  Mother requests a call from financial counseling to answer questions regarding Medicaid process.  Psych CSW will assist with this request and defer placement needs to unit CSW as this is not psychiatric placement.  Psych CSW remains available for assistance if the need should arise, please re-consult psych.  Vickii Penna, LCSWA [phone]  Clinical Social Work

## 2013-09-04 NOTE — Progress Notes (Signed)
Physical Therapy Treatment Patient Details Name: Adam Orr MRN: [id number] DOB: 1966-01-23 Today's Date: 09/04/2013 Time: 1115-1140 PT Time Calculation (min): 25 min  PT Assessment / Plan / Recommendation  History of Present Illness Adam Orr is a 47 y.o. male who presents to the ED with depression, weakness, and essentially failure to thrive due to streight binge drinking for the past 2 weeks in a row.  History is per family.  Per them patient has lost up to 60 lbs in this time period, has had essentially no PO intake at all other than ethanol.  Patient is very depressed, likely in part because he stopped taking his anti-depressant medication because he could not drink EtOH while on this.   PT Comments   Pt able to increase ambulation distance but continues to require assist with all mobility for safety and stability due to impaired balance and high fall risk. Mom unable to assist pt at home and will therefore need SNF for assist at DC. Pt continues to demonstrate impaired cognition and unable to use directional signs without cueing to return to room and lack of carryover of education as well as orientation. Will continue to follow.   Follow Up Recommendations  SNF;Supervision/Assistance - 24 hour     Does the patient have the potential to tolerate intense rehabilitation     Barriers to Discharge        Equipment Recommendations       Recommendations for Other Services    Frequency     Progress towards PT Goals Progress towards PT goals: Goals downgraded-see care plan  Plan Discharge plan needs to be updated    Precautions / Restrictions Precautions Precautions: Fall   Pertinent Vitals/Pain Stiff knees, VSS    Mobility  Bed Mobility Supine to Sit: 6: Modified independent (Device/Increase time);HOB flat Transfers Transfers: Stand Pivot Transfers Sit to Stand: 4: Min guard;From bed;From chair/3-in-1 Stand to Sit: 4: Min guard;To chair/3-in-1 Stand Pivot Transfers: 4:  Min guard Details for Transfer Assistance: cueing for hand placement and controlled descent with each trial Ambulation/Gait Ambulation/Gait Assistance: 4: Min assist Ambulation Distance (Feet): 400 Feet Assistive device: Rolling walker Ambulation/Gait Assistance Details: pt walked last 100'without RW and with increased staggering gait with decreased speed. Pt maintains almost full knee extension with weight on heels throughout gait with short steps and staggered gait with periods of scissoring. Cueing with use of RW to extend trunk, step into RW and increase stride length Gait Pattern: Step-through pattern;Decreased stride length;Decreased hip/knee flexion - left;Decreased hip/knee flexion - right;Narrow base of support;Trunk flexed Gait velocity: decreased Stairs: No    Exercises General Exercises - Lower Extremity Long Arc Quad: AROM;Both;20 reps;Seated Hip Flexion/Marching: AROM;Both;20 reps;Seated Toe Raises: AROM;Both;20 reps;Seated   PT Diagnosis:    PT Problem List:   PT Treatment Interventions:     PT Goals (current goals can now be found in the care plan section)    Visit Information  Last PT Received On: 09/04/13 Assistance Needed: +1 History of Present Illness: Adam Orr is a 47 y.o. male who presents to the ED with depression, weakness, and essentially failure to thrive due to streight binge drinking for the past 2 weeks in a row.  History is per family.  Per them patient has lost up to 60 lbs in this time period, has had essentially no PO intake at all other than ethanol.  Patient is very depressed, likely in part because he stopped taking his anti-depressant medication because he could  not drink EtOH while on this.    Subjective Data      Cognition  Cognition Arousal/Alertness: Awake/alert Behavior During Therapy: Flat affect Overall Cognitive Status: Impaired/Different from baseline Orientation Level: Time;Situation Memory: Decreased short-term  memory Safety/Judgement: Decreased awareness of safety    Balance  Berg Balance Test Sit to Stand: Able to stand using hands after several tries Standing Unsupported: Able to stand 2 minutes with supervision Sitting with Back Unsupported but Feet Supported on Floor or Stool: Able to sit 2 minutes under supervision Stand to Sit: Controls descent by using hands Transfers: Able to transfer with verbal cueing and /or supervision Standing Unsupported with Eyes Closed: Able to stand 10 seconds with supervision Standing Ubsupported with Feet Together: Able to place feet together independently and stand for 1 minute with supervision From Standing, Reach Forward with Outstretched Arm: Can reach forward >5 cm safely (2") From Standing Position, Pick up Object from Floor: Unable to try/needs assist to keep balance From Standing Position, Turn to Look Behind Over each Shoulder: Needs supervision when turning Turn 360 Degrees: Needs assistance while turning Standing Unsupported, Alternately Place Feet on Step/Stool: Needs assistance to keep from falling or unable to try Standing Unsupported, One Foot in Front: Loses balance while stepping or standing Standing on One Leg: Unable to try or needs assist to prevent fall Total Score: 22  End of Session PT - End of Session Equipment Utilized During Treatment: Gait belt Activity Tolerance: Patient tolerated treatment well Patient left: in chair;with call bell/phone within reach;with chair alarm set Nurse Communication: Mobility status   GP     Toney Sang Beth 09/04/2013, 12:46 PM Delaney Meigs, PT [phone]

## 2013-09-04 NOTE — Progress Notes (Signed)
CSW spoke to supervisor about dc possibly to an ALF [facility]) for two week stay. Supervisor agrees and is willing to do an LOG for two week stay at ALF. CSW awaiting [facility] number for patient and [facility] to return CSW phone call. Will update further tomorrow.  Maree Krabbe, MSW, [facility] [phone]

## 2013-09-04 NOTE — Progress Notes (Signed)
CSW referred patient to psych social worker who will follow up with patient's family and dc plans.  Maree Krabbe, MSW, [facility] [phone]

## 2013-09-04 NOTE — Progress Notes (Signed)
[facility] PROGRESS NOTE  Adam Orr ZOX:[id number] DOB: [birth year] DOA: 08/30/2013 PCP: No PCP Per Patient  Assessment/Plan: 1. Generalized weakness  1. Likely due poor PO intake and malnutrition 2. Was orthostatic on presentation, s/p banana bag, bp currently stable 3. Recommendations for SNF 4. Will discuss w/ psych SW 2. EtOH abuse  1. Patient again reminded he needs to quit drinking. 2. Cont with ETOH withdrawal protocol, although outside window now for DT's 3. Psych was re-consulted - appreciate recs 4. Pt lacks decision making capacity. Hopefully his family would be able to aide in his ultimate disposition. 3. Confusion 1. Persistently confused 2. 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 On thiamine 4. Unable to make own decisions as per above 4. Depression 1. Denies SI/HI. 2. Appreciate Psychiatry recs 3. Now on Celexa and remeron 4. Recent recs for outpatient psych treatment  Code Status: Full Family Communication: Pt in room (indicate person spoken with, relationship, and if by phone, the number) Disposition Plan: Pending  Consultants:  Psychiatry  HPI/Subjective: No events overnight. Pt remains confused. Objective: Filed Vitals:   09/03/13 0433 09/03/13 1328 09/03/13 2012 09/04/13 0413  BP: 110/73 91/48 121/81 112/70  Pulse: 64 83 89 79  Temp: 98.6 F (37 C) 98.8 F (37.1 C) 97.7 F (36.5 C) 98.8 F (37.1 C)  TempSrc:  Oral Oral Oral  Resp: 19 18 21 20   Height:      Weight:      SpO2: 99% 99% 100% 99%    Intake/Output Summary (Last 24 hours) at 09/04/13 0954 Last data filed at 09/04/13 0847  Gross per 24 hour  Intake    480 ml  Output   1050 ml  Net   -570 ml   Filed Weights   08/31/13 0148  Weight: 57.561 kg (126 lb 14.4 oz)    Exam:   General:  Awake, in nad, little/no insight to medical condition  Cardiovascular: regular, s1, s2  Respiratory: normal resp effort, no  wheezing  Abdomen: soft, nondisnteded  Musculoskeletal: perfused, no clubbing   Data Reviewed: Basic Metabolic Panel:  Recent Labs Lab 08/30/13 1747 08/31/13 0525  NA 140  --   K 3.9  --   CL 97  --   CO2 24  --   GLUCOSE 125*  --   BUN 15  --   CREATININE 0.84  --   CALCIUM 10.7*  --   MG  --  1.7  PHOS  --  3.4   Liver Function Tests:  Recent Labs Lab 08/30/13 1747  AST 45*  ALT 69*  ALKPHOS 50  BILITOT 1.8*  PROT 7.6  ALBUMIN 4.0   No results found for this basename: LIPASE, AMYLASE,  in the last 168 hours  Recent Labs Lab 08/30/13 2310  AMMONIA 12   CBC:  Recent Labs Lab 08/30/13 1747  WBC 6.2  HGB 17.0  HCT 46.5  MCV 97.9  PLT 206   Cardiac Enzymes: No results found for this basename: CKTOTAL, CKMB, CKMBINDEX, TROPONINI,  in the last 168 hours BNP (last 3 results) No results found for this basename: PROBNP,  in the last 8760 hours CBG: No results found for this basename: GLUCAP,  in the last 168 hours  Recent Results (from the past 240 hour(s))  GRAM STAIN     Status: None   Collection Time    08/30/13 10:42 PM      Result Value Range Status   Specimen Description URINE,  CATHETERIZED   Final   Special Requests Normal   Final   Gram Stain     Final   Value: CYTOSPIN PREP     WBC PRESENT,BOTH PMN AND MONONUCLEAR     NO ORGANISMS SEEN     Gram Stain Report Called to,Read Back By and Verified With: RN Clancy Gourd, W 2330 08/30/13 Jacolyn Reedy.   Report Status 08/30/2013 FINAL   Final     Studies: No results found.  Scheduled Meds: . amLODipine  10 mg Oral Daily  . aspirin  325 mg Oral Daily  . citalopram  20 mg Oral Daily  . feeding supplement  237 mL Oral BID BM  . folic acid  1 mg Oral Daily  . heparin  5,000 Units Subcutaneous Q8H  . mirtazapine  15 mg Oral QHS  . multivitamin with minerals  1 tablet Oral Daily  . nicotine  14 mg Transdermal Daily  . pantoprazole  40 mg Oral Daily  . sodium chloride  3 mL Intravenous Q12H  .  tamsulosin  0.4 mg Oral Daily  . thiamine  100 mg Oral Daily   Or  . thiamine  100 mg Intravenous Daily   Continuous Infusions:   Principal Problem:   Generalized weakness Active Problems:   ETOH abuse  Time spent:  Adam Orr K  [facility] Pager [phone]. If 7PM-7AM, please contact night-coverage at [email or web address], password [facility] 09/04/2013, 9:54 AM  LOS: 5 days

## 2013-09-04 NOTE — Progress Notes (Signed)
Follow Up Nutrition Note  DOCUMENTATION CODES Per approved criteria  -Severe malnutrition in the context of chronic illness -Underweight   INTERVENTION: 1. Continue Ensure Complete po BID, each supplement provides 350 kcal and 13 grams of protein.    NUTRITION DIAGNOSIS: Malnutrition related to etoh abuse as evidenced by weight loss and muscle/fat wasting.   Goal: PO intake to meet >/=90% estimated nutrition needs.   Monitor:  PO intake, weight trends, labs    ASSESSMENT: Pt admitted for FTT with Etoh abuse for 2 weeks with out any other oral intake.   Being evaluated by psych for capacity, dispo planning? Pt continues to eat well, 100% most meals. Continues with Ensure supplements.   Height: Ht Readings from Last 1 Encounters:  08/31/13 6\' 4"  (1.93 m)    Weight: Wt Readings from Last 1 Encounters:  08/31/13 126 lb 14.4 oz (57.561 kg)  No new weight since admission   BMI:  Body mass index is 15.45 kg/(m^2). Underweight   Estimated Nutritional Needs: Kcal: 2000-2200 Protein: 60-70 gm  Fluid: 2-2.2 L   Skin: intact   Diet Order: General  EDUCATION NEEDS: -No education needs identified at this time   Intake/Output Summary (Last 24 hours) at 09/04/13 0858 Last data filed at 09/04/13 0847  Gross per 24 hour  Intake    480 ml  Output   1050 ml  Net   -570 ml    Last BM: 9/7  Labs:   Recent Labs Lab 08/30/13 1747 08/31/13 0525  NA 140  --   K 3.9  --   CL 97  --   CO2 24  --   BUN 15  --   CREATININE 0.84  --   CALCIUM 10.7*  --   MG  --  1.7  PHOS  --  3.4  GLUCOSE 125*  --     CBG (last 3)  No results found for this basename: GLUCAP,  in the last 72 hours  Scheduled Meds: . amLODipine  10 mg Oral Daily  . aspirin  325 mg Oral Daily  . citalopram  20 mg Oral Daily  . feeding supplement  237 mL Oral BID BM  . folic acid  1 mg Oral Daily  . heparin  5,000 Units Subcutaneous Q8H  . mirtazapine  15 mg Oral QHS  . multivitamin with  minerals  1 tablet Oral Daily  . nicotine  14 mg Transdermal Daily  . pantoprazole  40 mg Oral Daily  . sodium chloride  3 mL Intravenous Q12H  . tamsulosin  0.4 mg Oral Daily  . thiamine  100 mg Oral Daily   Or  . thiamine  100 mg Intravenous Daily    Continuous Infusions:  none    Clarene Duke RD, LDN Pager [phone] After Hours pager [phone]

## 2013-09-05 DIAGNOSIS — E43 Unspecified severe protein-calorie malnutrition: Secondary | ICD-10-CM | POA: Insufficient documentation

## 2013-09-05 DIAGNOSIS — F17200 Nicotine dependence, unspecified, uncomplicated: Secondary | ICD-10-CM

## 2013-09-05 NOTE — Progress Notes (Signed)
[facility] PROGRESS NOTE  Adam Orr ZOX:[id number] DOB: April 01, 1966 DOA: 08/30/2013 PCP: No PCP Per Patient  Assessment/Plan: 1. Generalized weakness  1. Likely secondary to poor PO intake and malnutrition 2. Was orthostatic on presentation, s/p banana bag, bp currently stable 3. Recommendations for SNF 4. Will discuss w/ psych SW 2. EtOH abuse  1. Patient again reminded he needs to quit drinking. 2. Cont with ETOH withdrawal protocol, although outside window now for DT's 3. Psych was re-consulted - appreciate recs 4. Pt lacks decision making capacity. 3. Confusion 1. Persistently confused 2. 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 On thiamine 4. Unable to make own decisions as per above 4. Depression 1. Denies SI/HI. 2. Appreciate Psychiatry recs 3. Now on Celexa and remeron 4. Recent recs for outpatient psych treatment  Code Status: Full Family Communication: Pt in room (indicate person spoken with, relationship, and if by phone, the number) Disposition Plan: Pending  Consultants:  Psychiatry  HPI/Subjective: No events overnight. Remains pleasantly confused. Objective: Filed Vitals:   09/04/13 0413 09/04/13 1352 09/04/13 2004 09/05/13 0500  BP: 112/70 103/72 104/72 108/78  Pulse: 79 89 85 77  Temp: 98.8 F (37.1 C) 98.2 F (36.8 C) 98.9 F (37.2 C) 98.1 F (36.7 C)  TempSrc: Oral Oral Oral Oral  Resp: 20 20 18 20   Height:      Weight:      SpO2: 99% 100% 100% 97%    Intake/Output Summary (Last 24 hours) at 09/05/13 0859 Last data filed at 09/04/13 2151  Gross per 24 hour  Intake   1070 ml  Output    500 ml  Net    570 ml   Filed Weights   08/31/13 0148  Weight: 57.561 kg (126 lb 14.4 oz)    Exam:   General:  Awake, in nad, little/no insight to medical condition  Cardiovascular: regular, s1, s2  Respiratory: normal resp effort, no wheezing  Abdomen: soft, nondisnteded  Musculoskeletal:  perfused, no clubbing   Data Reviewed: Basic Metabolic Panel:  Recent Labs Lab 08/30/13 1747 08/31/13 0525  NA 140  --   K 3.9  --   CL 97  --   CO2 24  --   GLUCOSE 125*  --   BUN 15  --   CREATININE 0.84  --   CALCIUM 10.7*  --   MG  --  1.7  PHOS  --  3.4   Liver Function Tests:  Recent Labs Lab 08/30/13 1747  AST 45*  ALT 69*  ALKPHOS 50  BILITOT 1.8*  PROT 7.6  ALBUMIN 4.0   No results found for this basename: LIPASE, AMYLASE,  in the last 168 hours  Recent Labs Lab 08/30/13 2310  AMMONIA 12   CBC:  Recent Labs Lab 08/30/13 1747  WBC 6.2  HGB 17.0  HCT 46.5  MCV 97.9  PLT 206   Cardiac Enzymes: No results found for this basename: CKTOTAL, CKMB, CKMBINDEX, TROPONINI,  in the last 168 hours BNP (last 3 results) No results found for this basename: PROBNP,  in the last 8760 hours CBG: No results found for this basename: GLUCAP,  in the last 168 hours  Recent Results (from the past 240 hour(s))  GRAM STAIN     Status: None   Collection Time    08/30/13 10:42 PM      Result Value Range Status   Specimen Description URINE, CATHETERIZED   Final   Special Requests Normal  Final   Gram Stain     Final   Value: CYTOSPIN PREP     WBC PRESENT,BOTH PMN AND MONONUCLEAR     NO ORGANISMS SEEN     Gram Stain Report Called to,Read Back By and Verified With: RN Clancy Gourd, W 2330 08/30/13 Jacolyn Reedy.   Report Status 08/30/2013 FINAL   Final     Studies: No results found.  Scheduled Meds: . amLODipine  10 mg Oral Daily  . aspirin  325 mg Oral Daily  . citalopram  20 mg Oral Daily  . feeding supplement  237 mL Oral BID BM  . folic acid  1 mg Oral Daily  . heparin  5,000 Units Subcutaneous Q8H  . mirtazapine  15 mg Oral QHS  . multivitamin with minerals  1 tablet Oral Daily  . nicotine  14 mg Transdermal Daily  . pantoprazole  40 mg Oral Daily  . sodium chloride  3 mL Intravenous Q12H  . tamsulosin  0.4 mg Oral Daily  . thiamine  100 mg Oral Daily    Continuous Infusions:   Principal Problem:   Generalized weakness Active Problems:   ETOH abuse   Protein-calorie malnutrition, severe  Time spent:  CHIU, STEPHEN K  [facility] Pager [phone]. If 7PM-7AM, please contact night-coverage at [email or web address], password [facility] 09/05/2013, 8:59 AM  LOS: 6 days

## 2013-09-05 NOTE — Progress Notes (Signed)
CSW awaiting PASRR number in order for patient to be discharged to an ALF. CSW will update when more information arises.  Maree Krabbe, MSW, [facility] [phone]

## 2013-09-05 NOTE — Progress Notes (Signed)
Clinical Social Work Department CLINICAL SOCIAL WORK PLACEMENT NOTE 09/05/2013  Patient:  LENNY, FIUMARA  Account Number:  [id number] Admit date:  08/30/2013  Clinical Social Worker:  Carren Rang  Date/time:  09/05/2013 08:37 AM  Clinical Social Work is seeking post-discharge placement for this patient at the following level of care:   ASSISTED LIVING/REST HOME   (*CSW will update this form in Epic as items are completed)   09/04/2013  Patient/family provided with [facility] Department of Clinical Social Work's list of facilities offering this level of care within the geographic area requested by the patient (or if unable, by the patient's family).  09/04/2013  Patient/family informed of their freedom to choose among providers that offer the needed level of care, that participate in Medicare, Medicaid or managed care program needed by the patient, have an available bed and are willing to accept the patient.  09/04/2013  Patient/family informed of [facility]' ownership interest in [facility], as well as of the fact that they are under no obligation to receive care at this facility.  PASARR submitted to [employer] on 09/04/2013 PASARR number received from [employer] on 09/04/2013  FL2 transmitted to all facilities in geographic area requested by pt/family on  09/04/2013 FL2 transmitted to all facilities within larger geographic area on   Patient informed that his/her managed care company has contracts with or will negotiate with  certain facilities, including the following:     Patient/family informed of bed offers received:  09/05/2013 Patient chooses bed at  Physician recommends and patient chooses bed at    Patient to be transferred to  on   Patient to be transferred to facility by   The following physician request were entered in Epic:   Additional Comments:  Maree Krabbe, MSW, [place] [phone]

## 2013-09-05 NOTE — Progress Notes (Signed)
Occupational Therapy Treatment Patient Details Name: Adam Orr MRN: [id number] DOB: Feb 21, 1966 Today's Date: 09/05/2013 Time: 8295-6213 OT Time Calculation (min): 13 min  OT Assessment / Plan / Recommendation       OT comments  Amb. To/from b.room with r.w., and able to stand at sink for grooming tasks.  Min guard a, and intermittent cues for walker safety.  Attempting conversation but confused and mixing what appears to be some factual information with fabricated information.  (see examples below in cognition section of note)  Follow Up Recommendations  SNF                      Frequency Min 2X/week   Progress towards OT Goals Progress towards OT goals: Progressing toward goals  Plan Discharge plan remains appropriate    Precautions / Restrictions Precautions Precautions: Fall   Pertinent Vitals/Pain 0/10 no c/o pain    ADL  Grooming: Performed;Wash/dry hands;Supervision/safety Where Assessed - Grooming: Unsupported standing Toilet Transfer: Performed;Min Pension scheme manager Method: Other (comment) (amb. to b.room and stood to urinate) Acupuncturist: Regular height toilet Toileting - Clothing Manipulation and Hygiene: Performed;Min guard Where Assessed - Glass blower/designer Manipulation and Hygiene: Standing Transfers/Ambulation Related to ADLs: min guard with rw, cues for walker safety ADL Comments: cues for standing inside of walker at sink for safety. did initiate washing his hands after using the b.room without cueing         OT Goals(current goals can now be found in the care plan section)    Visit Information  Last OT Received On: 09/05/13                 Cognition  Cognition Arousal/Alertness: Awake/alert Behavior During Therapy: Flat affect Overall Cognitive Status: Impaired/Different from baseline Area of Impairment: Orientation;Safety/judgement Orientation Level: Time;Situation Memory: Decreased short-term  memory Safety/Judgement: Decreased awareness of safety General Comments: pt. attempting conversation but very jumbled and confused with thought process "i'm not as tall as randy", "who is randy" "my brother, your husband", also said "who shops at [employer], your sister works there you told me she does"    Mobility  Bed Mobility Bed Mobility: Rolling Right;Right Sidelying to Sit;Sitting - Scoot to Edge of Bed Rolling Right: 6: Modified independent (Device/Increase time) Right Sidelying to Sit: 6: Modified independent (Device/Increase time);HOB flat Sitting - Scoot to Edge of Bed: 6: Modified independent (Device/Increase time) Transfers Transfers: Sit to Stand;Stand to Sit Sit to Stand: 4: Min guard;From bed Stand to Sit: 4: Min guard;To chair/3-in-1               End of Session OT - End of Session Equipment Utilized During Treatment: Rolling walker Activity Tolerance: Patient tolerated treatment well Patient left: in chair;with call bell/phone within reach;with chair alarm set       Robet Leu, COTA/L 09/05/2013, 8:10 AM

## 2013-09-06 MED ORDER — INFLUENZA VAC SPLIT QUAD 0.5 ML IM SUSP
0.5000 mL | INTRAMUSCULAR | Status: AC
  Administered 2013-09-07: 0.5 mL via INTRAMUSCULAR
  Filled 2013-09-06: qty 0.5

## 2013-09-06 NOTE — Progress Notes (Signed)
Update: Spoke with Mother of patient. Mother states patient has been living in an upstairs apartment and the onset of confusion was 2 weeks ago. The confusion is not his normal.

## 2013-09-06 NOTE — Progress Notes (Signed)
Physical Therapy Treatment Patient Details Name: [name] MRN: [id number] DOB: 08/13/1966 Today's Date: 09/06/2013 Time: 3086-5784 PT Time Calculation (min): 20 min  PT Assessment / Plan / Recommendation  History of Present Illness Adam Orr is a 47 y.o. male who presents to the ED with depression, weakness, and essentially failure to thrive due to streight binge drinking for the past 2 weeks in a row.  History is per family.  Per them patient has lost up to 60 lbs in this time period, has had essentially no PO intake at all other than ethanol.  Patient is very depressed, likely in part because he stopped taking his anti-depressant medication because he could not drink EtOH while on this.   PT Comments   Patient demonstrates some improvements in functional mobility and stability today using rw, still requires increased assist when not using device. Will continue to see and progress activity as tolerated.   Follow Up Recommendations  SNF;Supervision/Assistance - 24 hour     Does the patient have the potential to tolerate intense rehabilitation     Barriers to Discharge        Equipment Recommendations  Rolling walker with 5" wheels    Recommendations for Other Services    Frequency Min 3X/week   Progress towards PT Goals    Plan Discharge plan needs to be updated    Precautions / Restrictions Precautions Precautions: Fall Restrictions Weight Bearing Restrictions: No   Pertinent Vitals/Pain Patient reports no pain at this time    Mobility  Bed Mobility Bed Mobility: Rolling Right;Right Sidelying to Sit;Sitting - Scoot to Edge of Bed Rolling Right: 6: Modified independent (Device/Increase time) Right Sidelying to Sit: 6: Modified independent (Device/Increase time);HOB flat Supine to Sit: 6: Modified independent (Device/Increase time);HOB flat Sitting - Scoot to Edge of Bed: 6: Modified independent (Device/Increase time) Transfers Transfers: Stand Pivot  Transfers Sit to Stand: 4: Min guard;From bed Stand to Sit: 4: Min guard;To chair/3-in-1 Stand Pivot Transfers: 4: Min guard Details for Transfer Assistance: cueing for hand placement and controlled descent with each trial Ambulation/Gait Ambulation/Gait Assistance: 5: Supervision Ambulation Distance (Feet): 410 Feet Assistive device: Rolling walker Ambulation/Gait Assistance Details: improved stability today with use of RW Gait Pattern: Step-through pattern;Decreased stride length;Decreased hip/knee flexion - left;Decreased hip/knee flexion - right;Narrow base of support;Trunk flexed Gait velocity: decreased Stairs: No    Exercises General Exercises - Lower Extremity Long Arc Quad: AROM;Both;20 reps;Seated Hip Flexion/Marching: AROM;Both;20 reps;Seated Toe Raises: AROM;Both;20 reps;Seated   PT Diagnosis:    PT Problem List:   PT Treatment Interventions:     PT Goals (current goals can now be found in the care plan section) Acute Rehab PT Goals Patient Stated Goal: return home PT Goal Formulation: With patient Time For Goal Achievement: 09/14/13 Potential to Achieve Goals: Fair  Visit Information  Last PT Received On: 09/06/13 Assistance Needed: +1 History of Present Illness: Adam Orr is a 47 y.o. male who presents to the ED with depression, weakness, and essentially failure to thrive due to streight binge drinking for the past 2 weeks in a row.  History is per family.  Per them patient has lost up to 60 lbs in this time period, has had essentially no PO intake at all other than ethanol.  Patient is very depressed, likely in part because he stopped taking his anti-depressant medication because he could not drink EtOH while on this.    Subjective Data  Subjective: Im just bored Patient Stated Goal: return  home   Cognition  Cognition Arousal/Alertness: Awake/alert Behavior During Therapy: Flat affect Overall Cognitive Status: Impaired/Different from baseline Area of  Impairment: Orientation;Safety/judgement Orientation Level: Time;Situation Memory: Decreased short-term memory Safety/Judgement: Decreased awareness of safety General Comments: pt. attempting conversation but very jumbled and confused with thought process "i'm not as tall as randy", "who is randy" "my brother, your husband", also said "who shops at [employer], your sister works there you told me she does"    Development worker, international aid Balance Assessed: Yes Static Sitting Balance Static Sitting - Balance Support: Feet supported;No upper extremity supported Static Sitting - Level of Assistance: 5: Stand by assistance;Other (comment) (R lateral lean) Static Standing Balance Static Standing - Balance Support: Bilateral upper extremity supported Static Standing - Level of Assistance: 4: Min assist Dynamic Standing Balance Dynamic Standing - Balance Support: Bilateral upper extremity supported;During functional activity Dynamic Standing - Level of Assistance: 4: Min assist  End of Session PT - End of Session Equipment Utilized During Treatment: Gait belt Activity Tolerance: Patient tolerated treatment well Patient left: in chair;with call bell/phone within reach;with chair alarm set Nurse Communication: Mobility status   GP     Fabio Asa 09/06/2013, 1:51 PM Charlotte Crumb, PT DPT  [phone]

## 2013-09-06 NOTE — Progress Notes (Signed)
CSW spoke to [facility] for PASRR number. A representative from [place] stated that an evaluator will contact CSW within the next two days to evaluate patient. CSW awaiting PASRR number.  Maree Krabbe, MSW, Theresia Majors [phone]

## 2013-09-06 NOTE — Progress Notes (Signed)
[facility]                                                                                Patient Demographics  Adam Orr, is a 47 y.o. male, DOB - 1966/11/18, ZOX:[id number]  Admit date - 08/30/2013   Admitting Physician Hillary Bow, DO  Outpatient Primary MD for the patient is No PCP Per Patient  LOS - 7   Chief Complaint  Patient presents with  . Medical Clearance  . Dehydration        Assessment & Plan    Generalized weakness  1. Likely secondary to poor PO intake and malnutrition 2. Was orthostatic on presentation, s/p banana bag, bp currently stable, Strength is improving 3. Recommendations for SNF 4. Will discuss w/ psych SW 5.   We'll check TSH and B12 levels    EtOH abuse  1. Patient again reminded he needs to quit drinking. 2. Cont with ETOH withdrawal protocol, although outside window now for DT's,Continue thiamine and folic acid supplementation 3. Psych was re-consulted - appreciate recs 4. Pt lacks decision making capacity.     Confusion  1. Somewhat improved 2. 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 On thiamine 4. Unable to make own decisions as per above 5. Will check B12 and TSH also confusion has improved    Depression  1. Denies SI/HI. 2. Appreciate Psychiatry recs 3. Now on Celexa and remeron 4. Recent recs for outpatient psych treatment     Nonspecific findings on CT scan  Nonemergent outpatient MRI with contrast of the brain.    Code Status: full  Family Communication: mother  Disposition Plan: SNF   Procedures CT Head   Consults  Psych   DVT Prophylaxis   Heparin   Lab Results  Component Value Date   PLT 206 08/30/2013    Medications  Scheduled Meds: . amLODipine  10 mg Oral Daily  . aspirin  325 mg Oral Daily  . citalopram  20 mg Oral Daily  . feeding supplement  237 mL Oral BID BM  . folic acid  1 mg Oral Daily  . heparin  5,000 Units Subcutaneous  Q8H  . mirtazapine  15 mg Oral QHS  . multivitamin with minerals  1 tablet Oral Daily  . nicotine  14 mg Transdermal Daily  . pantoprazole  40 mg Oral Daily  . sodium chloride  3 mL Intravenous Q12H  . tamsulosin  0.4 mg Oral Daily  . thiamine  100 mg Oral Daily   Continuous Infusions:  PRN Meds:.  Antibiotics     Anti-infectives   None       Time Spent in minutes  35   SINGH,PRASHANT K M.D on 09/06/2013 at 11:30 AM  Between 7am to 7pm - Pager - [phone]  After 7pm go to [email or web address] - password [email or web address]  And look for the night coverage person covering for me after hours  [facility] Office  [phone]    Subjective:   Adam Orr today has, No headache, No chest pain, No abdominal pain - No Nausea, No new weakness tingling or numbness, No Cough - SOB.    Objective:  Filed Vitals:   09/05/13 1345 09/05/13 1940 09/06/13 0535 09/06/13 0551  BP: 94/63 103/69  119/81  Pulse: 58 90 87 86  Temp: 98.9 F (37.2 C) 98.7 F (37.1 C) 98.1 F (36.7 C)   TempSrc: Oral Oral Oral   Resp: 18 18 20    Height:      Weight:      SpO2: 99% 100% 100%     Wt Readings from Last 3 Encounters:  08/31/13 57.561 kg (126 lb 14.4 oz)  04/19/13 69.854 kg (154 lb)  02/16/13 71.124 kg (156 lb 12.8 oz)     Intake/Output Summary (Last 24 hours) at 09/06/13 1130 Last data filed at 09/06/13 0900  Gross per 24 hour  Intake   1920 ml  Output    601 ml  Net   1319 ml    Exam Awake Alert, Oriented X 3, No new F.N deficits, Normal affect .AT,PERRAL Supple Neck,No JVD, No cervical lymphadenopathy appriciated.  Symmetrical Chest wall movement, Good air movement bilaterally, CTAB RRR,No Gallops,Rubs or new Murmurs, No Parasternal Heave +ve B.Sounds, Abd Soft, Non tender, No organomegaly appriciated, No rebound - guarding or rigidity. No Cyanosis, Clubbing or edema, No new Rash or bruise     Data Review   Micro Results Recent Results (from the past 240 hour(s))   GRAM STAIN     Status: None   Collection Time    08/30/13 10:42 PM      Result Value Range Status   Specimen Description URINE, CATHETERIZED   Final   Special Requests Normal   Final   Gram Stain     Final   Value: CYTOSPIN PREP     WBC PRESENT,BOTH PMN AND MONONUCLEAR     NO ORGANISMS SEEN     Gram Stain Report Called to,Read Back By and Verified With: RN Clancy Gourd, W 2330 08/30/13 Jacolyn Reedy.   Report Status 08/30/2013 FINAL   Final    Radiology Reports Dg Chest 2 View  08/30/2013   *RADIOLOGY REPORT*  Clinical Data: Medical clearance.  Dehydration.  CHEST - 2 VIEW  Comparison: 12/26/2012  Findings: The heart size and pulmonary vascularity are normal and the lungs are clear.  Transvenous defibrillator in place.  No acute osseous abnormality.  IMPRESSION: No acute disease.   Original Report Authenticated By: Francene Boyers, M.D.   Ct Head Wo Contrast  08/30/2013   *RADIOLOGY REPORT*  Clinical Data: Generalized weakness  CT HEAD WITHOUT CONTRAST  Technique:  Contiguous axial images were obtained from the base of the skull through the vertex without contrast.  Comparison: 08/31/2010  Findings: There is no evidence for acute hemorrhage, hydrocephalus, or abnormal extra-axial fluid collection.  No definite CT evidence for acute infarction.  4 mm hyperattenuating focus in the region of the left foramen [place] may represent a tiny colloid cyst.  Diffuse loss of parenchymal volume is consistent with atrophy. Patchy low attenuation in the deep hemispheric and periventricular white matter is nonspecific, but likely reflects chronic microvascular ischemic demyelination.  IMPRESSION: No acute intracranial abnormality.  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  There is no evidence for hydrocephalus.  Follow up non emergent brain MRI without and with contrast could be used to further evaluate, as clinically warranted.  Band-like x-ray   Original Report Authenticated By: Kennith Center,  M.D.    CBC  Recent Labs Lab 08/30/13 1747  WBC 6.2  HGB 17.0  HCT 46.5  PLT 206  MCV 97.9  MCH 35.8*  MCHC 36.6*  RDW 12.4    Chemistries   Recent Labs Lab 08/30/13 1747 08/31/13 0525  NA 140  --   K 3.9  --   CL 97  --   CO2 24  --   GLUCOSE 125*  --   BUN 15  --   CREATININE 0.84  --   CALCIUM 10.7*  --   MG  --  1.7  AST 45*  --   ALT 69*  --   ALKPHOS 50  --   BILITOT 1.8*  --    ------------------------------------------------------------------------------------------------------------------ estimated creatinine clearance is 88.6 ml/min (by C-G formula based on Cr of 0.84). ------------------------------------------------------------------------------------------------------------------ No results found for this basename: HGBA1C,  in the last 72 hours ------------------------------------------------------------------------------------------------------------------ No results found for this basename: CHOL, HDL, LDLCALC, TRIG, CHOLHDL, LDLDIRECT,  in the last 72 hours ------------------------------------------------------------------------------------------------------------------ No results found for this basename: TSH, T4TOTAL, FREET3, T3FREE, THYROIDAB,  in the last 72 hours ------------------------------------------------------------------------------------------------------------------ No results found for this basename: VITAMINB12, FOLATE, FERRITIN, TIBC, IRON, RETICCTPCT,  in the last 72 hours  Coagulation profile No results found for this basename: INR, PROTIME,  in the last 168 hours  No results found for this basename: DDIMER,  in the last 72 hours  Cardiac Enzymes No results found for this basename: CK, CKMB, TROPONINI, MYOGLOBIN,  in the last 168 hours ------------------------------------------------------------------------------------------------------------------ No components found with this basename: POCBNP,

## 2013-09-07 LAB — T4: T4, Total: 5.8 ug/dL (ref 5.0–12.5)

## 2013-09-07 LAB — VITAMIN B12: Vitamin B-12: 222 pg/mL (ref 211–911)

## 2013-09-07 LAB — TSH: TSH: 6.555 u[IU]/mL — ABNORMAL HIGH (ref 0.350–4.500)

## 2013-09-07 LAB — T3, FREE: T3, Free: 2.8 pg/mL (ref 2.3–4.2)

## 2013-09-07 MED ORDER — THIAMINE HCL 100 MG/ML IJ SOLN
100.0000 mg | Freq: Every day | INTRAMUSCULAR | Status: DC
  Filled 2013-09-07 (×2): qty 1

## 2013-09-07 MED ORDER — CYANOCOBALAMIN 1000 MCG/ML IJ SOLN
1000.0000 ug | Freq: Every day | INTRAMUSCULAR | Status: AC
  Administered 2013-09-07 – 2013-09-11 (×4): 1000 ug via INTRAMUSCULAR
  Filled 2013-09-07 (×5): qty 1

## 2013-09-07 NOTE — Progress Notes (Signed)
Physical Therapy Treatment Patient Details Name: Adam Orr MRN: [id number] DOB: 07/08/1966 Today's Date: 09/07/2013 Time: 1050-1108 PT Time Calculation (min): 18 min  PT Assessment / Plan / Recommendation  History of Present Illness Adam Orr is a 47 y.o. male who presents to the ED with depression, weakness, and essentially failure to thrive due to streight binge drinking for the past 2 weeks in a row.  History is per family.  Per them patient has lost up to 60 lbs in this time period, has had essentially no PO intake at all other than ethanol.  Patient is very depressed, likely in part because he stopped taking his anti-depressant medication because he could not drink EtOH while on this.   PT Comments   Patient very confused this am.  When asked about his doctor he stated that his doctor "doctor peppers" had been in today.  Later he stated that "this man" pointing to a picture of the president had come into his room and told him to go get "tickets for the theatre today" and he asked if we could take care of that while we were working together. Patient was able to ambulate increased distance but cognition and impulsivity still appear to be concerns. Will continue to work with patient and progress activity as tolerated.   Follow Up Recommendations  SNF;Supervision/Assistance - 24 hour     Does the patient have the potential to tolerate intense rehabilitation     Barriers to Discharge        Equipment Recommendations  Rolling walker with 5" wheels    Recommendations for Other Services    Frequency Min 3X/week   Progress towards PT Goals Progress towards PT goals: Progressing toward goals  Plan Discharge plan needs to be updated    Precautions / Restrictions Precautions Precautions: Fall Restrictions Weight Bearing Restrictions: No   Pertinent Vitals/Pain Patient reports pain in his knees 4/10    Mobility  Bed Mobility Bed Mobility: Rolling Right;Right Sidelying to  Sit;Sitting - Scoot to Edge of Bed Rolling Right: 6: Modified independent (Device/Increase time) Right Sidelying to Sit: 6: Modified independent (Device/Increase time);HOB flat Supine to Sit: 6: Modified independent (Device/Increase time);HOB flat Sitting - Scoot to Edge of Bed: 6: Modified independent (Device/Increase time) Transfers Transfers: Stand Pivot Transfers Sit to Stand: 4: Min guard;From bed Stand to Sit: 4: Min guard;To chair/3-in-1 Stand Pivot Transfers: 4: Min guard Details for Transfer Assistance: cueing for hand placement and controlled descent with each trial Ambulation/Gait Ambulation/Gait Assistance: 5: Supervision Ambulation Distance (Feet): 500 Feet Assistive device: Rolling walker Ambulation/Gait Assistance Details: still requires use of rw Gait Pattern: Step-through pattern;Decreased stride length;Decreased hip/knee flexion - left;Decreased hip/knee flexion - right;Narrow base of support;Trunk flexed Gait velocity: decreased Stairs: No    Exercises General Exercises - Lower Extremity Long Arc Quad: AROM;Both;20 reps;Seated Hip Flexion/Marching: AROM;Both;20 reps;Seated Toe Raises: AROM;Both;20 reps;Seated     PT Goals (current goals can now be found in the care plan section) Acute Rehab PT Goals Patient Stated Goal: return home PT Goal Formulation: With patient Time For Goal Achievement: 09/14/13 Potential to Achieve Goals: Fair  Visit Information  Last PT Received On: 09/07/13 Assistance Needed: +1 History of Present Illness: Adam Orr is a 47 y.o. male who presents to the ED with depression, weakness, and essentially failure to thrive due to streight binge drinking for the past 2 weeks in a row.  History is per family.  Per them patient has lost up to 60 lbs  in this time period, has had essentially no PO intake at all other than ethanol.  Patient is very depressed, likely in part because he stopped taking his anti-depressant medication because he  could not drink EtOH while on this.    Subjective Data  Subjective: Im just bored Patient Stated Goal: return home   Cognition  Cognition Arousal/Alertness: Awake/alert Behavior During Therapy: Flat affect Overall Cognitive Status: Impaired/Different from baseline Area of Impairment: Orientation;Safety/judgement Orientation Level: Time;Situation Memory: Decreased short-term memory Safety/Judgement: Decreased awareness of safety General Comments: patient confused this am stating that the president (who's image was on the newspaper cover in front of him) had been to see him this morning and told him that he would need to get tickets for the theatre today    Balance  Balance Balance Assessed: Yes Static Sitting Balance Static Sitting - Balance Support: Feet supported;No upper extremity supported Static Sitting - Level of Assistance: 5: Stand by assistance;Other (comment) (R lateral lean) Static Standing Balance Static Standing - Balance Support: Bilateral upper extremity supported Static Standing - Level of Assistance: 4: Min assist Dynamic Standing Balance Dynamic Standing - Balance Support: Bilateral upper extremity supported;During functional activity Dynamic Standing - Level of Assistance: 4: Min assist  End of Session PT - End of Session Equipment Utilized During Treatment: Gait belt Activity Tolerance: Patient tolerated treatment well Patient left: in chair;with call bell/phone within reach;with chair alarm set Nurse Communication: Mobility status   GP     Fabio Asa 09/07/2013, 11:59 AM Charlotte Crumb, PT DPT  [phone]

## 2013-09-07 NOTE — Progress Notes (Signed)
CSW confirmed with [facility] that CSW is waiting on a PASRR number. [facility] Admissions stated that they still have availability. CSW will update when PASRR number is received.   Maree Krabbe, MSW, Theresia Majors [phone]

## 2013-09-07 NOTE — Progress Notes (Signed)
[facility]                                                                                Patient Demographics  Adam Orr, is a 47 y.o. male, DOB - 13-Feb-1966, RUE:[id number]  Admit date - 08/30/2013   Admitting Physician Hillary Bow, DO  Outpatient Primary MD for the patient is No PCP Per Patient  LOS - 8   Chief Complaint  Patient presents with  . Medical Clearance  . Dehydration        Assessment & Plan    Generalized weakness  1. Likely secondary to poor PO intake and malnutrition 2. Was orthostatic on presentation, s/p banana bag, bp currently stable, Strength is improving 3. Recommendations for SNF 4. Will discuss w/ psych SW     EtOH abuse  1. Patient again reminded he needs to quit drinking. 2. Cont with ETOH withdrawal protocol, although outside window now for DT's,Continue thiamine and folic acid supplementation 3. Psych was re-consulted - appreciate recs 4. Pt lacks decision making capacity.     Confusion - per psych not have the capacity to make his own decisions 1. Somewhat improved 2. 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 Also B12 is low normal and will place him on IV B12 for the next few days 4. Continue folic acid supplementation     Depression  1. Denies SI/HI. 2. Appreciate Psychiatry recs 3. Now on Celexa and remeron 4. Recent recs for outpatient psych treatment     Mildly elevated TSH.   We will check free T3 and T4 and monitor.     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 Last EF in 2013 is 55%.     Nonspecific findings on CT scan  Nonemergent MRI of the brain recommended we will order if it can be done of note patient has underlying AICD. We'll have to check if this is compatible.      Code Status: full  Family Communication: mother  Disposition Plan: SNF , await his PASSAR  number   Procedures CT Head, MRI if possible   Consults  Psych   DVT Prophylaxis   Heparin   Lab Results  Component Value Date   PLT 206 08/30/2013    Medications  Scheduled Meds: . amLODipine  10 mg Oral Daily  . aspirin  325 mg Oral Daily  . citalopram  20 mg Oral Daily  . cyanocobalamin  1,000 mcg Intramuscular Daily  . feeding supplement  237 mL Oral BID BM  . folic acid  1 mg Oral Daily  . heparin  5,000 Units Subcutaneous Q8H  . influenza vac split quadrivalent PF  0.5 mL Intramuscular Tomorrow-1000  . mirtazapine  15 mg Oral QHS  . multivitamin with minerals  1 tablet Oral Daily  . nicotine  14 mg Transdermal Daily  . pantoprazole  40 mg Oral Daily  . sodium chloride  3 mL Intravenous Q12H  . tamsulosin  0.4 mg Oral Daily  . thiamine  100 mg Intravenous Daily   Continuous Infusions:  PRN Meds:.  Antibiotics  Anti-infectives   None       Time Spent in minutes  35   Susa Raring K M.D on 09/07/2013 at 10:45 AM  Between 7am to 7pm - Pager - [phone]  After 7pm go to [email or web address] - password [email or web address]  And look for the night coverage person covering for me after hours  [facility] Office  [phone]    Subjective:   Adam Orr today has, No headache, No chest pain, No abdominal pain - No Nausea, No new weakness tingling or numbness, No Cough - SOB.    Objective:   Filed Vitals:   09/06/13 0551 09/06/13 1323 09/06/13 2012 09/07/13 0429  BP: 119/81 104/66 101/64 99/67  Pulse: 86 89 99 82  Temp:  98.9 F (37.2 C) 99.6 F (37.6 C) 98.2 F (36.8 C)  TempSrc:  Oral Oral Oral  Resp:  18 18   Height:      Weight:      SpO2:  100% 99%     Wt Readings from Last 3 Encounters:  08/31/13 57.561 kg (126 lb 14.4 oz)  04/19/13 69.854 kg (154 lb)  02/16/13 71.124 kg (156 lb 12.8 oz)     Intake/Output Summary (Last 24 hours) at 09/07/13 1045 Last data filed at 09/07/13 0900  Gross per 24 hour  Intake   1440 ml  Output     951 ml  Net    489 ml    Exam Awake Alert, Oriented X 3, No new F.N deficits, Normal affect [place].AT,PERRAL Supple Neck,No JVD, No cervical lymphadenopathy appriciated.  Symmetrical Chest wall movement, Good air movement bilaterally, CTAB RRR,No Gallops,Rubs or new Murmurs, No Parasternal Heave +ve B.Sounds, Abd Soft, Non tender, No organomegaly appriciated, No rebound - guarding or rigidity. No Cyanosis, Clubbing or edema, No new Rash or bruise     Data Review   Micro Results Recent Results (from the past 240 hour(s))  GRAM STAIN     Status: None   Collection Time    08/30/13 10:42 PM      Result Value Range Status   Specimen Description URINE, CATHETERIZED   Final   Special Requests Normal   Final   Gram Stain     Final   Value: CYTOSPIN PREP     WBC PRESENT,BOTH PMN AND MONONUCLEAR     NO ORGANISMS SEEN     Gram Stain Report Called to,Read Back By and Verified With: RN Clancy Gourd, W 2330 08/30/13 Jacolyn Reedy.   Report Status 08/30/2013 FINAL   Final    Radiology Reports Dg Chest 2 View  08/30/2013   *RADIOLOGY REPORT*  Clinical Data: Medical clearance.  Dehydration.  CHEST - 2 VIEW  Comparison: 12/26/2012  Findings: The heart size and pulmonary vascularity are normal and the lungs are clear.  Transvenous defibrillator in place.  No acute osseous abnormality.  IMPRESSION: No acute disease.   Original Report Authenticated By: Francene Boyers, M.D.   Ct Head Wo Contrast  08/30/2013   *RADIOLOGY REPORT*  Clinical Data: Generalized weakness  CT HEAD WITHOUT CONTRAST  Technique:  Contiguous axial images were obtained from the base of the skull through the vertex without contrast.  Comparison: 08/31/2010  Findings: There is no evidence for acute hemorrhage, hydrocephalus, or abnormal extra-axial fluid collection.  No definite CT evidence for acute infarction.  4 mm hyperattenuating focus in the region of the left foramen [place] may represent a tiny colloid cyst.  Diffuse loss of parenchymal volume  is consistent with atrophy. Patchy  low attenuation in the deep hemispheric and periventricular white matter is nonspecific, but likely reflects chronic microvascular ischemic demyelination.  IMPRESSION: No acute intracranial abnormality.  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  There is no evidence for hydrocephalus.  Follow up non emergent brain MRI without and with contrast could be used to further evaluate, as clinically warranted.  Band-like x-ray   Original Report Authenticated By: Kennith Center, M.D.    CBC No results found for this basename: WBC, HGB, HCT, PLT, MCV, MCH, MCHC, RDW, NEUTRABS, LYMPHSABS, MONOABS, EOSABS, BASOSABS, BANDABS, BANDSABD,  in the last 168 hours  Chemistries  No results found for this basename: NA, K, CL, CO2, GLUCOSE, BUN, CREATININE, GFRCGP, CALCIUM, MG, AST, ALT, ALKPHOS, BILITOT,  in the last 168 hours ------------------------------------------------------------------------------------------------------------------ estimated creatinine clearance is 88.6 ml/min (by C-G formula based on Cr of 0.84). ------------------------------------------------------------------------------------------------------------------ No results found for this basename: HGBA1C,  in the last 72 hours ------------------------------------------------------------------------------------------------------------------ No results found for this basename: CHOL, HDL, LDLCALC, TRIG, CHOLHDL, LDLDIRECT,  in the last 72 hours ------------------------------------------------------------------------------------------------------------------  Recent Labs  09/06/13 1320  TSH 6.555*   ------------------------------------------------------------------------------------------------------------------  Recent Labs  09/06/13 1320  VITAMINB12 222    Coagulation profile No results found for this basename: INR, PROTIME,  in the last 168 hours  No results found for  this basename: DDIMER,  in the last 72 hours  Cardiac Enzymes No results found for this basename: CK, CKMB, TROPONINI, MYOGLOBIN,  in the last 168 hours ------------------------------------------------------------------------------------------------------------------ No components found with this basename: POCBNP,

## 2013-09-08 MED ORDER — HALOPERIDOL LACTATE 5 MG/ML IJ SOLN
2.0000 mg | Freq: Four times a day (QID) | INTRAMUSCULAR | Status: DC | PRN
  Filled 2013-09-08: qty 0.4

## 2013-09-08 MED ORDER — THIAMINE HCL 100 MG/ML IJ SOLN
Freq: Three times a day (TID) | INTRAVENOUS | Status: AC
  Administered 2013-09-08 – 2013-09-10 (×8): via INTRAVENOUS
  Filled 2013-09-08 (×9): qty 50

## 2013-09-08 MED ORDER — DEXTROSE 5 % IV SOLN: INTRAVENOUS | Status: DC

## 2013-09-08 MED ORDER — THIAMINE HCL 100 MG/ML IJ SOLN: 500.0000 mg | Freq: Three times a day (TID) | INTRAMUSCULAR | Status: DC

## 2013-09-08 NOTE — Progress Notes (Signed)
Physical Therapy Treatment Patient Details Name: Adam Orr MRN: [id number] DOB: 1966-01-30 Today's Date: 09/08/2013 Time: 0912-0931 PT Time Calculation (min): 19 min  PT Assessment / Plan / Recommendation  History of Present Illness Adam Orr is a 47 y.o. male who presents to the ED with depression, weakness, and essentially failure to thrive due to streight binge drinking for the past 2 weeks in a row.  History is per family.  Per them patient has lost up to 60 lbs in this time period, has had essentially no PO intake at all other than ethanol.  Patient is very depressed, likely in part because he stopped taking his anti-depressant medication because he could not drink EtOH while on this.   PT Comments   Worked with patient and attempted ambulation without assistive device today. Patient was mod to max assist with multiple loss of balance and very uncoordinated and staggering gait. While using rolling walker patient demonstrates increased stability but still at times required some assist to avoid objects and direct to task. Patient continues to demonstrate confusion throughout session as indicated below. Feel patient will need assistance with all OOB mobility. Will continue to see as indicated and progress activity as tolerated.  Follow Up Recommendations  SNF;Supervision/Assistance - 24 hour     Does the patient have the potential to tolerate intense rehabilitation     Barriers to Discharge        Equipment Recommendations  Rolling walker with 5" wheels    Recommendations for Other Services    Frequency Min 3X/week   Progress towards PT Goals Progress towards PT goals: Not progressing toward goals - comment (patient still requires assist for ambulation)  Plan Discharge plan needs to be updated    Precautions / Restrictions Precautions Precautions: Fall Restrictions Weight Bearing Restrictions: No   Pertinent Vitals/Pain Patient reports no pain today    Mobility  Bed  Mobility Bed Mobility: Rolling Right;Right Sidelying to Sit;Sitting - Scoot to Edge of Bed Rolling Right: 6: Modified independent (Device/Increase time) Right Sidelying to Sit: 6: Modified independent (Device/Increase time);HOB flat Supine to Sit: 6: Modified independent (Device/Increase time);HOB flat Sitting - Scoot to Edge of Bed: 6: Modified independent (Device/Increase time) Transfers Transfers: Sit to Stand;Stand to Sit Sit to Stand: 4: Min guard;From bed Stand to Sit: 4: Min guard;To chair/3-in-1 Details for Transfer Assistance: cueing for hand placement and controlled descent with each trial Ambulation/Gait Ambulation/Gait Assistance: 5: Supervision;3: Mod assist (mod assist without device) Ambulation Distance (Feet): 410 Feet Assistive device: Rolling walker;None (160 ft without assistive device) Ambulation/Gait Assistance Details: patient with multiple loss of balance when ambulating without assistive device, very staggering and uncoordinated gait with increased scissoring causing patient to trip over his on feet Gait Pattern: Step-through pattern;Decreased stride length;Decreased hip/knee flexion - left;Decreased hip/knee flexion - right;Narrow base of support;Trunk flexed Gait velocity: decreased Stairs: No    Exercises General Exercises - Lower Extremity Long Arc Quad: AROM;Both;10 reps;Seated     PT Goals (current goals can now be found in the care plan section) Acute Rehab PT Goals Patient Stated Goal: return home PT Goal Formulation: With patient Time For Goal Achievement: 09/14/13 Potential to Achieve Goals: Fair  Visit Information  Last PT Received On: 09/08/13 Assistance Needed: +1 History of Present Illness: Adam Orr is a 47 y.o. male who presents to the ED with depression, weakness, and essentially failure to thrive due to streight binge drinking for the past 2 weeks in a row.  History is  per family.  Per them patient has lost up to 60 lbs in this time  period, has had essentially no PO intake at all other than ethanol.  Patient is very depressed, likely in part because he stopped taking his anti-depressant medication because he could not drink EtOH while on this.    Subjective Data  Subjective: Im just bored Patient Stated Goal: return home   Cognition  Cognition Arousal/Alertness: Awake/alert Behavior During Therapy: Flat affect Overall Cognitive Status: Impaired/Different from baseline Area of Impairment: Orientation;Safety/judgement Orientation Level: Time;Situation Memory: Decreased short-term memory Safety/Judgement: Decreased awareness of safety General Comments: Confusion still present today. Patient states that the tv remote is at his sisters apartment, and patient also states that the 2 men in the hall are his brothers (facility management men)    Balance  Balance Balance Assessed: Yes Static Standing Balance Static Standing - Balance Support: Bilateral upper extremity supported Static Standing - Level of Assistance: 4: Min assist Dynamic Standing Balance Dynamic Standing - Balance Support: No upper extremity supported;During functional activity Dynamic Standing - Level of Assistance: 3: Mod assist  End of Session PT - End of Session Equipment Utilized During Treatment: Gait belt Activity Tolerance: Patient tolerated treatment well Patient left: in chair;with call bell/phone within reach;with chair alarm set Nurse Communication: Mobility status   GP     Fabio Asa 09/08/2013, 9:55 AM Charlotte Crumb, PT DPT  [phone]

## 2013-09-08 NOTE — Progress Notes (Addendum)
CSW spoke to [facility] Must for an update. [place] Must [facility] stated that a representative is going to call CSW and see patient "as soon as she gets to it." Representative stated it may possibly be today, "but they have a busy caseload." CSW awaiting phone call from evaluator. Will update when new information arises.  Maree Krabbe, MSW, [facility] [phone]

## 2013-09-08 NOTE — Progress Notes (Signed)
[facility]                                                                                Patient Demographics  Adam Orr, is a 47 y.o. male, DOB - 1966-05-01, ZOX:[id number]  Admit date - 08/30/2013   Admitting Physician Hillary Bow, DO  Outpatient Primary MD for the patient is No PCP Per Patient  LOS - 9   Chief Complaint  Patient presents with  . Medical Clearance  . Dehydration        Assessment & Plan    Generalized weakness  1. Likely secondary to poor PO intake and malnutrition 2. Was orthostatic on presentation, s/p banana bag, bp currently stable, Strength is improving 3. Await placement by social work     EtOH abuse  1. Patient again reminded he needs to quit drinking. 2. Cont with ETOH withdrawal protocol, although outside window now for DT's,Continue thiamine and folic acid supplementation 3. Psych was re-consulted - appreciate recs 4. Pt lacks decision making capacity.     Confusion - per psych not have the capacity to make his own decisions 1. 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 2. Also B12 is low normal and will place him on IV B12 for the next few days 3. Continue folic acid supplementation     Depression  1. Denies SI/HI. 2. Appreciate Psychiatry recs 3. Now on Celexa and remeron 4. Recent recs for outpatient psych treatment     Mildly elevated TSH. Due to his acute illness, free T3 and T4 are stable. Repeat TSH in 4 weeks time.      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 Last EF in 2013 is 55%.     Nonspecific findings on CT scan  Note MRI cannot be done due to his history of AICD outpatient followup with neurology post discharge.      Code Status: full  Family  Communication: mother  Disposition Plan: SNF , await his PASSAR number   Procedures CT Head, MRI if possible   Consults  Psych   DVT Prophylaxis   Heparin   Lab Results  Component Value Date   PLT 206 08/30/2013    Medications  Scheduled Meds: . amLODipine  10 mg Oral Daily  . aspirin  325 mg Oral Daily  . citalopram  20 mg Oral Daily  . cyanocobalamin  1,000 mcg Intramuscular Daily  . feeding supplement  237 mL Oral BID BM  . folic acid  1 mg Oral Daily  . heparin  5,000 Units Subcutaneous Q8H  . mirtazapine  15 mg Oral QHS  . multivitamin with minerals  1 tablet Oral Daily  . nicotine  14 mg Transdermal Daily  . pantoprazole  40 mg Oral Daily  . sodium chloride 0.9 % 50 mL with thiamine (B-1) 500 mg infusion   Intravenous TID  . sodium chloride  3 mL Intravenous Q12H  . tamsulosin  0.4 mg Oral Daily   Continuous Infusions:  PRN Meds:.  Antibiotics     Anti-infectives   None       Time Spent in minutes  35   Susa Raring K M.D on 09/08/2013 at 10:31 AM  Between 7am to 7pm - Pager - [phone]  After 7pm go to [email or web address] - password [email or web address]  And look for the night coverage person covering for me after hours  [facility] Office  [phone]    Subjective:   Adam Orr today has, No headache, No chest pain, No abdominal pain - No Nausea, No new weakness tingling or numbness, No Cough - SOB.    Objective:   Filed Vitals:   09/07/13 0429 09/07/13 1345 09/07/13 1943 09/08/13 0615  BP: 99/67 104/62 112/74 108/69  Pulse: 82 93 102 85  Temp: 98.2 F (36.8 C) 97.9 F (36.6 C) 99.3 F (37.4 C) 98.7 F (37.1 C)  TempSrc: Oral Oral Oral Oral  Resp:  20 20 20   Height:      Weight:      SpO2:  99% 100% 100%    Wt Readings from Last 3 Encounters:  08/31/13 57.561 kg (126 lb 14.4 oz)  04/19/13 69.854 kg (154 lb)  02/16/13 71.124 kg (156 lb 12.8 oz)     Intake/Output Summary (Last 24 hours) at 09/08/13 1031 Last data filed at  09/08/13 0800  Gross per 24 hour  Intake   1560 ml  Output   1751 ml  Net   -191 ml    Exam Awake , pleasantly confused, No new F.N deficits, Normal affect [place].AT,PERRAL, does have horizontal nystagmus bilaterally Supple Neck,No JVD, No cervical lymphadenopathy appriciated.  Symmetrical Chest wall movement, Good air movement bilaterally, CTAB RRR,No Gallops,Rubs or new Murmurs, No Parasternal Heave +ve B.Sounds, Abd Soft, Non tender, No organomegaly appriciated, No rebound - guarding or rigidity. No Cyanosis, Clubbing or edema, No new Rash or bruise     Data Review   Micro Results Recent Results (from the past 240 hour(s))  GRAM STAIN     Status: None   Collection Time    08/30/13 10:42 PM      Result Value Range Status   Specimen Description URINE, CATHETERIZED   Final   Special Requests Normal   Final   Gram Stain     Final   Value: CYTOSPIN PREP     WBC PRESENT,BOTH PMN AND MONONUCLEAR     NO ORGANISMS SEEN     Gram Stain Report Called to,Read Back By and Verified With: RN Clancy Gourd, W 2330 08/30/13 Jacolyn Reedy.   Report Status 08/30/2013 FINAL   Final    Radiology Reports Dg Chest 2 View  08/30/2013   *RADIOLOGY REPORT*  Clinical Data: Medical clearance.  Dehydration.  CHEST - 2 VIEW  Comparison: 12/26/2012  Findings: The heart size and pulmonary vascularity are normal and the lungs are clear.  Transvenous defibrillator in place.  No acute osseous abnormality.  IMPRESSION: No acute disease.   Original Report Authenticated By: Francene Boyers, M.D.   Ct Head Wo Contrast  08/30/2013   *RADIOLOGY REPORT*  Clinical Data: Generalized weakness  CT HEAD WITHOUT CONTRAST  Technique:  Contiguous axial images were obtained from the base of the skull through the vertex without contrast.  Comparison: 08/31/2010  Findings: There is no evidence for acute hemorrhage, hydrocephalus, or abnormal extra-axial fluid collection.  No definite CT evidence for acute infarction.  4 mm hyperattenuating focus in  the region of the left foramen [place] may represent a tiny  colloid cyst.  Diffuse loss of parenchymal volume is consistent with atrophy. Patchy low attenuation in the deep hemispheric and periventricular white matter is nonspecific, but likely reflects chronic microvascular ischemic demyelination.  IMPRESSION: No acute intracranial abnormality.  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  There is no evidence for hydrocephalus.  Follow up non emergent brain MRI without and with contrast could be used to further evaluate, as clinically warranted.  Band-like x-ray   Original Report Authenticated By: Kennith Center, M.D.    CBC No results found for this basename: WBC, HGB, HCT, PLT, MCV, MCH, MCHC, RDW, NEUTRABS, LYMPHSABS, MONOABS, EOSABS, BASOSABS, BANDABS, BANDSABD,  in the last 168 hours  Chemistries  No results found for this basename: NA, K, CL, CO2, GLUCOSE, BUN, CREATININE, GFRCGP, CALCIUM, MG, AST, ALT, ALKPHOS, BILITOT,  in the last 168 hours ------------------------------------------------------------------------------------------------------------------ estimated creatinine clearance is 88.6 ml/min (by C-G formula based on Cr of 0.84). ------------------------------------------------------------------------------------------------------------------ No results found for this basename: HGBA1C,  in the last 72 hours ------------------------------------------------------------------------------------------------------------------ No results found for this basename: CHOL, HDL, LDLCALC, TRIG, CHOLHDL, LDLDIRECT,  in the last 72 hours ------------------------------------------------------------------------------------------------------------------  Recent Labs  09/06/13 1320 09/07/13 1220  TSH 6.555*  --   T4TOTAL  --  5.8  T3FREE  --  2.8    ------------------------------------------------------------------------------------------------------------------  Recent Labs  09/06/13 1320  VITAMINB12 222    Coagulation profile No results found for this basename: INR, PROTIME,  in the last 168 hours  No results found for this basename: DDIMER,  in the last 72 hours  Cardiac Enzymes No results found for this basename: CK, CKMB, TROPONINI, MYOGLOBIN,  in the last 168 hours ------------------------------------------------------------------------------------------------------------------ No components found with this basename: POCBNP,

## 2013-09-08 NOTE — Progress Notes (Signed)
PASRR evaluator called CSW and stated that she is coming to see patient and evaluate tonight. [facility] also stated that [place] MUST will most likely not be able to provide a PASRR number Monday. CSW will update when new information arises.  Maree Krabbe, MSW, [employer] [phone]

## 2013-09-09 NOTE — Progress Notes (Signed)
[facility]                                                                                Patient Demographics  Adam Orr, is a 47 y.o. male, DOB - November 14, 1966, ZOX:[id number]  Admit date - 08/30/2013   Admitting Physician Hillary Bow, DO  Outpatient Primary MD for the patient is No PCP Per Patient  LOS - 10   Chief Complaint  Patient presents with  . Medical Clearance  . Dehydration        Assessment & Plan    Generalized weakness  1. Likely secondary to poor PO intake and malnutrition 2. Was orthostatic on presentation, s/p banana bag, bp currently stable, Strength is improving with PT 3. Await placement by social work     EtOH abuse  1. Patient again reminded he needs to quit drinking. 2. Cont with ETOH withdrawal protocol, although outside window now for DT's,Continue thiamine and folic acid supplementation 3. Psych was re-consulted - appreciate recs 4. Pt lacks decision making capacity.     Confusion - per psych not have the capacity to make his own decisions 1. 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 2. Also B12 is low normal and will place him on IV B12 for the next few days 3. Continue folic acid supplementation     Depression  1. Denies SI/HI. 2. Appreciate Psychiatry recs 3. Now on Celexa and remeron 4. Recent recs for outpatient psych treatment     Mildly elevated TSH. Due to his acute illness, free T3 and T4 are stable. Repeat TSH in 4 weeks time.      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 Last EF in 2013 is 55%.     Nonspecific findings on CT scan  Note MRI cannot be done due to his history of AICD outpatient followup with neurology post discharge.      Code Status:  full  Family Communication: mother  Disposition Plan: SNF , await his PASSAR number   Procedures CT Head, MRI if possible   Consults  Psych   DVT Prophylaxis   Heparin   Lab Results  Component Value Date   PLT 206 08/30/2013    Medications  Scheduled Meds: . amLODipine  10 mg Oral Daily  . aspirin  325 mg Oral Daily  . citalopram  20 mg Oral Daily  . cyanocobalamin  1,000 mcg Intramuscular Daily  . feeding supplement  237 mL Oral BID BM  . folic acid  1 mg Oral Daily  . heparin  5,000 Units Subcutaneous Q8H  . mirtazapine  15 mg Oral QHS  . multivitamin with minerals  1 tablet Oral Daily  . nicotine  14 mg Transdermal Daily  . pantoprazole  40 mg Oral Daily  . sodium chloride 0.9 % 50 mL with thiamine (B-1) 500 mg infusion   Intravenous TID  . sodium chloride  3 mL Intravenous Q12H  . tamsulosin  0.4 mg Oral Daily   Continuous  Infusions:  PRN Meds:.  Antibiotics     Anti-infectives   None       Time Spent in minutes  35   Susa Raring K M.D on 09/09/2013 at 1:15 PM  Between 7am to 7pm - Pager - [phone]  After 7pm go to [email or web address] - password [email or web address]  And look for the night coverage person covering for me after hours  [facility] Office  [phone]    Subjective:   Adam Orr today has, No headache, No chest pain, No abdominal pain - No Nausea, No new weakness tingling or numbness, No Cough - SOB.    Objective:   Filed Vitals:   09/08/13 1332 09/08/13 1350 09/08/13 1932 09/09/13 0424  BP: 101/66 101/66 119/80 108/72  Pulse: 97 97 105 92  Temp: 97.9 F (36.6 C) 97.9 F (36.6 C) 98.8 F (37.1 C) 98.9 F (37.2 C)  TempSrc: Oral Oral Oral Oral  Resp: 20 18 18 18   Height:      Weight:      SpO2: 100% 99% 100% 99%    Wt Readings from Last 3 Encounters:  08/31/13 57.561 kg (126 lb 14.4 oz)  04/19/13 69.854 kg (154 lb)  02/16/13 71.124 kg (156 lb 12.8 oz)     Intake/Output Summary (Last 24 hours) at 09/09/13  1315 Last data filed at 09/09/13 6578  Gross per 24 hour  Intake    240 ml  Output   1400 ml  Net  -1160 ml    Exam Awake , pleasantly confused, No new F.N deficits, Normal affect [place].AT,PERRAL, does have horizontal nystagmus bilaterally Supple Neck,No JVD, No cervical lymphadenopathy appriciated.  Symmetrical Chest wall movement, Good air movement bilaterally, CTAB RRR,No Gallops,Rubs or new Murmurs, No Parasternal Heave +ve B.Sounds, Abd Soft, Non tender, No organomegaly appriciated, No rebound - guarding or rigidity. No Cyanosis, Clubbing or edema, No new Rash or bruise     Data Review   Micro Results Recent Results (from the past 240 hour(s))  GRAM STAIN     Status: None   Collection Time    08/30/13 10:42 PM      Result Value Range Status   Specimen Description URINE, CATHETERIZED   Final   Special Requests Normal   Final   Gram Stain     Final   Value: CYTOSPIN PREP     WBC PRESENT,BOTH PMN AND MONONUCLEAR     NO ORGANISMS SEEN     Gram Stain Report Called to,Read Back By and Verified With: RN Clancy Gourd, W 2330 08/30/13 Jacolyn Reedy.   Report Status 08/30/2013 FINAL   Final    Radiology Reports Dg Chest 2 View  08/30/2013   *RADIOLOGY REPORT*  Clinical Data: Medical clearance.  Dehydration.  CHEST - 2 VIEW  Comparison: 12/26/2012  Findings: The heart size and pulmonary vascularity are normal and the lungs are clear.  Transvenous defibrillator in place.  No acute osseous abnormality.  IMPRESSION: No acute disease.   Original Report Authenticated By: Francene Boyers, M.D.   Ct Head Wo Contrast  08/30/2013   *RADIOLOGY REPORT*  Clinical Data: Generalized weakness  CT HEAD WITHOUT CONTRAST  Technique:  Contiguous axial images were obtained from the base of the skull through the vertex without contrast.  Comparison: 08/31/2010  Findings: There is no evidence for acute hemorrhage, hydrocephalus, or abnormal extra-axial fluid collection.  No definite CT evidence for acute infarction.  4 mm  hyperattenuating focus in the region of the left foramen [place] may represent  a tiny colloid cyst.  Diffuse loss of parenchymal volume is consistent with atrophy. Patchy low attenuation in the deep hemispheric and periventricular white matter is nonspecific, but likely reflects chronic microvascular ischemic demyelination.  IMPRESSION: No acute intracranial abnormality.  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  There is no evidence for hydrocephalus.  Follow up non emergent brain MRI without and with contrast could be used to further evaluate, as clinically warranted.  Band-like x-ray   Original Report Authenticated By: Kennith Center, M.D.    CBC No results found for this basename: WBC, HGB, HCT, PLT, MCV, MCH, MCHC, RDW, NEUTRABS, LYMPHSABS, MONOABS, EOSABS, BASOSABS, BANDABS, BANDSABD,  in the last 168 hours  Chemistries  No results found for this basename: NA, K, CL, CO2, GLUCOSE, BUN, CREATININE, GFRCGP, CALCIUM, MG, AST, ALT, ALKPHOS, BILITOT,  in the last 168 hours ------------------------------------------------------------------------------------------------------------------ estimated creatinine clearance is 88.6 ml/min (by C-G formula based on Cr of 0.84). ------------------------------------------------------------------------------------------------------------------ No results found for this basename: HGBA1C,  in the last 72 hours ------------------------------------------------------------------------------------------------------------------ No results found for this basename: CHOL, HDL, LDLCALC, TRIG, CHOLHDL, LDLDIRECT,  in the last 72 hours ------------------------------------------------------------------------------------------------------------------  Recent Labs  09/06/13 1320 09/07/13 1220  TSH 6.555*  --   T4TOTAL  --  5.8  T3FREE  --  2.8    ------------------------------------------------------------------------------------------------------------------  Recent Labs  09/06/13 1320  VITAMINB12 222    Coagulation profile No results found for this basename: INR, PROTIME,  in the last 168 hours  No results found for this basename: DDIMER,  in the last 72 hours  Cardiac Enzymes No results found for this basename: CK, CKMB, TROPONINI, MYOGLOBIN,  in the last 168 hours ------------------------------------------------------------------------------------------------------------------ No components found with this basename: POCBNP,

## 2013-09-09 NOTE — Progress Notes (Signed)
Order had been placed for restraints while pt was receiving fluid if pt interfered with intravenous therapy.  Fluid was discontinued so pt was not placed on restraints on 9/12 or 9/13.  Pt is currently pleasant, but confused.

## 2013-09-10 LAB — CBC
HCT: 32.2 % — ABNORMAL LOW (ref 39.0–52.0)
Hemoglobin: 10.8 g/dL — ABNORMAL LOW (ref 13.0–17.0)
MCH: 34.2 pg — ABNORMAL HIGH (ref 26.0–34.0)
MCHC: 33.5 g/dL (ref 30.0–36.0)
MCV: 101.9 fL — ABNORMAL HIGH (ref 78.0–100.0)
Platelets: 222 10*3/uL (ref 150–400)
RBC: 3.16 MIL/uL — ABNORMAL LOW (ref 4.22–5.81)
RDW: 14.2 % (ref 11.5–15.5)
WBC: 4.9 10*3/uL (ref 4.0–10.5)

## 2013-09-10 LAB — BASIC METABOLIC PANEL
BUN: 5 mg/dL — ABNORMAL LOW (ref 6–23)
CO2: 28 mEq/L (ref 19–32)
Calcium: 9.9 mg/dL (ref 8.4–10.5)
Chloride: 106 mEq/L (ref 96–112)
Creatinine, Ser: 0.66 mg/dL (ref 0.50–1.35)
GFR calc Af Amer: >90 mL/min (ref >90)
GFR calc non Af Amer: >90 mL/min (ref >90)
Glucose, Bld: 82 mg/dL (ref 70–99)
Potassium: 3.9 mEq/L (ref 3.5–5.1)
Sodium: 142 mEq/L (ref 135–145)

## 2013-09-10 LAB — MAGNESIUM: Magnesium: 1.8 mg/dL (ref 1.5–2.5)

## 2013-09-10 MED ORDER — ACETAMINOPHEN 500 MG PO TABS
500.0000 mg | ORAL_TABLET | Freq: Three times a day (TID) | ORAL | Status: DC | PRN
  Administered 2013-09-10: 500 mg via ORAL
  Filled 2013-09-10: qty 1

## 2013-09-10 NOTE — Progress Notes (Signed)
[facility]                                                                                Patient Demographics  Adam Orr, is a 47 y.o. male, DOB - 1966/01/16, ZOX:[id number]  Admit date - 08/30/2013   Admitting Physician Hillary Bow, DO  Outpatient Primary MD for the patient is No PCP Per Patient  LOS - 11   Chief Complaint  Patient presents with  . Medical Clearance  . Dehydration        Assessment & Plan    Generalized weakness  1. Likely secondary to poor PO intake and malnutrition 2. Was orthostatic on presentation, s/p banana bag, bp currently stable, Strength is improving with PT 3. Await placement by social work     EtOH abuse  1. Patient again reminded he needs to quit drinking. 2. Cont with ETOH withdrawal protocol, although outside window now for DT's,Continue thiamine and folic acid supplementation 3. Psych was re-consulted - appreciate recs 4. Pt lacks decision making capacity.     Confusion - per psych not have the capacity to make his own decisions 1. 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 2. Also B12 is low normal and will place him on IV B12 for the next few days 3. Continue folic acid supplementation     Depression  1. Denies SI/HI. 2. Appreciate Psychiatry recs 3. Now on Celexa and remeron 4. Recent recs for outpatient psych treatment     Mildly elevated TSH. Due to his acute illness, free T3 and T4 are stable. Repeat TSH in 4 weeks time.      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 Last EF in 2013 is 55%.     Nonspecific findings on CT scan  Note MRI cannot be done due to his history of AICD outpatient followup with neurology post discharge.      Code Status:  full  Family Communication: mother  Disposition Plan: SNF , await his PASSAR number   Procedures CT Head, MRI if possible   Consults  Psych   DVT Prophylaxis   Heparin   Lab Results  Component Value Date   PLT 222 09/10/2013    Medications  Scheduled Meds: . amLODipine  10 mg Oral Daily  . aspirin  325 mg Oral Daily  . citalopram  20 mg Oral Daily  . cyanocobalamin  1,000 mcg Intramuscular Daily  . feeding supplement  237 mL Oral BID BM  . folic acid  1 mg Oral Daily  . heparin  5,000 Units Subcutaneous Q8H  . mirtazapine  15 mg Oral QHS  . multivitamin with minerals  1 tablet Oral Daily  . nicotine  14 mg Transdermal Daily  . pantoprazole  40 mg Oral Daily  . sodium chloride 0.9 % 50 mL with thiamine (B-1) 500 mg infusion   Intravenous TID  . sodium chloride  3 mL Intravenous Q12H  . tamsulosin  0.4 mg Oral Daily   Continuous  Infusions:  PRN Meds:.  Antibiotics     Anti-infectives   None       Time Spent in minutes  35   Susa Raring K M.D on 09/10/2013 at 10:42 AM  Between 7am to 7pm - Pager - [phone]  After 7pm go to [email or web address] - password [email or web address]  And look for the night coverage person covering for me after hours  [facility] Office  [phone]    Subjective:   Adam Orr today has, No headache, No chest pain, No abdominal pain - No Nausea, No new weakness tingling or numbness, No Cough - SOB.    Objective:   Filed Vitals:   09/09/13 1432 09/09/13 1954 09/10/13 0446 09/10/13 1040  BP: 114/70 124/79 106/75 118/64  Pulse: 90 104 85   Temp: 98.6 F (37 C) 98.3 F (36.8 C) 98.2 F (36.8 C)   TempSrc: Oral Oral Oral   Resp: 18 18 18    Height:      Weight:      SpO2: 99% 100% 98%     Wt Readings from Last 3 Encounters:  08/31/13 57.561 kg (126 lb 14.4 oz)  04/19/13 69.854 kg (154 lb)  02/16/13 71.124 kg (156 lb 12.8 oz)     Intake/Output Summary (Last 24 hours) at 09/10/13 1042 Last data filed at 09/09/13  1300  Gross per 24 hour  Intake    240 ml  Output      0 ml  Net    240 ml    Exam Awake , pleasantly confused, No new F.N deficits, Normal affect [place].AT,PERRAL, does have horizontal nystagmus bilaterally Supple Neck,No JVD, No cervical lymphadenopathy appriciated.  Symmetrical Chest wall movement, Good air movement bilaterally, CTAB RRR,No Gallops,Rubs or new Murmurs, No Parasternal Heave +ve B.Sounds, Abd Soft, Non tender, No organomegaly appriciated, No rebound - guarding or rigidity. No Cyanosis, Clubbing or edema, No new Rash or bruise     Data Review   Micro Results No results found for this or any previous visit (from the past 240 hour(s)).  Radiology Reports Dg Chest 2 View  08/30/2013   *RADIOLOGY REPORT*  Clinical Data: Medical clearance.  Dehydration.  CHEST - 2 VIEW  Comparison: 12/26/2012  Findings: The heart size and pulmonary vascularity are normal and the lungs are clear.  Transvenous defibrillator in place.  No acute osseous abnormality.  IMPRESSION: No acute disease.   Original Report Authenticated By: Francene Boyers, M.D.   Ct Head Wo Contrast  08/30/2013   *RADIOLOGY REPORT*  Clinical Data: Generalized weakness  CT HEAD WITHOUT CONTRAST  Technique:  Contiguous axial images were obtained from the base of the skull through the vertex without contrast.  Comparison: 08/31/2010  Findings: There is no evidence for acute hemorrhage, hydrocephalus, or abnormal extra-axial fluid collection.  No definite CT evidence for acute infarction.  4 mm hyperattenuating focus in the region of the left foramen [place] may represent a tiny colloid cyst.  Diffuse loss of parenchymal volume is consistent with atrophy. Patchy low attenuation in the deep hemispheric and periventricular white matter is nonspecific, but likely reflects chronic microvascular ischemic demyelination.  IMPRESSION: No acute intracranial abnormality.  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   There is no evidence for hydrocephalus.  Follow up non emergent brain MRI without and with contrast could be used to further evaluate, as clinically warranted.  Band-like x-ray   Original Report Authenticated By: Kennith Center, M.D.    CBC  Recent  Labs Lab 09/10/13 0445  WBC 4.9  HGB 10.8*  HCT 32.2*  PLT 222  MCV 101.9*  MCH 34.2*  MCHC 33.5  RDW 14.2    Chemistries   Recent Labs Lab 09/10/13 0445  NA 142  K 3.9  CL 106  CO2 28  GLUCOSE 82  BUN 5*  CREATININE 0.66  CALCIUM 9.9  MG 1.8   ------------------------------------------------------------------------------------------------------------------ estimated creatinine clearance is 93 ml/min (by C-G formula based on Cr of 0.66). ------------------------------------------------------------------------------------------------------------------ No results found for this basename: HGBA1C,  in the last 72 hours ------------------------------------------------------------------------------------------------------------------ No results found for this basename: CHOL, HDL, LDLCALC, TRIG, CHOLHDL, LDLDIRECT,  in the last 72 hours ------------------------------------------------------------------------------------------------------------------  Recent Labs  09/07/13 1220  T4TOTAL 5.8  T3FREE 2.8   ------------------------------------------------------------------------------------------------------------------ No results found for this basename: VITAMINB12, FOLATE, FERRITIN, TIBC, IRON, RETICCTPCT,  in the last 72 hours  Coagulation profile No results found for this basename: INR, PROTIME,  in the last 168 hours  No results found for this basename: DDIMER,  in the last 72 hours  Cardiac Enzymes No results found for this basename: CK, CKMB, TROPONINI, MYOGLOBIN,  in the last 168 hours ------------------------------------------------------------------------------------------------------------------ No components found  with this basename: POCBNP,

## 2013-09-11 MED ORDER — CITALOPRAM HYDROBROMIDE 20 MG PO TABS: 20.0000 mg | ORAL_TABLET | Freq: Every day | ORAL | Status: AC

## 2013-09-11 MED ORDER — MIRTAZAPINE 15 MG PO TBDP: 15.0000 mg | ORAL_TABLET | Freq: Every day | ORAL | Status: DC

## 2013-09-11 MED ORDER — THIAMINE HCL 100 MG PO TABS: 100.0000 mg | ORAL_TABLET | Freq: Every day | ORAL | Status: DC

## 2013-09-11 MED ORDER — TAMSULOSIN HCL 0.4 MG PO CAPS: 0.4000 mg | ORAL_CAPSULE | Freq: Every day | ORAL | Status: DC

## 2013-09-11 MED ORDER — ENSURE COMPLETE PO LIQD: 237.0000 mL | Freq: Two times a day (BID) | ORAL | Status: DC

## 2013-09-11 MED ORDER — CYANOCOBALAMIN 1000 MCG/ML IJ SOLN: 1000.0000 ug | INTRAMUSCULAR | Status: DC

## 2013-09-11 MED ORDER — VITAMIN B-1 100 MG PO TABS
100.0000 mg | ORAL_TABLET | Freq: Every day | ORAL | Status: DC
  Administered 2013-09-11 – 2013-09-12 (×2): 100 mg via ORAL
  Filled 2013-09-11 (×2): qty 1

## 2013-09-11 MED ORDER — FOLIC ACID 1 MG PO TABS: 1.0000 mg | ORAL_TABLET | Freq: Every day | ORAL | Status: AC

## 2013-09-11 MED ORDER — NICOTINE 14 MG/24HR TD PT24: 1.0000 | MEDICATED_PATCH | Freq: Every day | TRANSDERMAL | Status: DC

## 2013-09-11 NOTE — Progress Notes (Signed)
Follow Up Nutrition Note  DOCUMENTATION CODES Per approved criteria  -Severe malnutrition in the context of chronic illness -Underweight   INTERVENTION: 1. Continue Ensure Complete po BID, each supplement provides 350 kcal and 13 grams of protein.  NUTRITION DIAGNOSIS: Malnutrition related to etoh abuse as evidenced by weight loss and muscle/fat wasting.   Goal: PO intake to meet >/=90% estimated nutrition needs.   Monitor:  PO intake, weight trends, labs    ASSESSMENT: Pt admitted for FTT with Etoh abuse for 2 weeks with out any other oral intake.   Pt is very confused and delusional.  Pt continues to eat well, 100% most meals. Continues with Ensure supplements. No changes to current interventions needed.  Height: Ht Readings from Last 1 Encounters:  08/31/13 6\' 4"  (1.93 m)    Weight: Wt Readings from Last 1 Encounters:  08/31/13 126 lb 14.4 oz (57.561 kg)  No new weight since admission   BMI:  Body mass index is 15.45 kg/(m^2). Underweight   Estimated Nutritional Needs: Kcal: 2000-2200 Protein: 60-70 gm  Fluid: 2-2.2 L   Skin: intact   Diet Order: General  EDUCATION NEEDS: -No education needs identified at this time   Intake/Output Summary (Last 24 hours) at 09/11/13 1227 Last data filed at 09/11/13 1015  Gross per 24 hour  Intake   1130 ml  Output      0 ml  Net   1130 ml    Last BM: 9/15  Labs:   Recent Labs Lab 09/10/13 0445  NA 142  K 3.9  CL 106  CO2 28  BUN 5*  CREATININE 0.66  CALCIUM 9.9  MG 1.8  GLUCOSE 82    CBG (last 3)  No results found for this basename: GLUCAP,  in the last 72 hours  Scheduled Meds: . amLODipine  10 mg Oral Daily  . aspirin  325 mg Oral Daily  . citalopram  20 mg Oral Daily  . cyanocobalamin  1,000 mcg Intramuscular Daily  . feeding supplement  237 mL Oral BID BM  . folic acid  1 mg Oral Daily  . heparin  5,000 Units Subcutaneous Q8H  . mirtazapine  15 mg Oral QHS  . multivitamin with minerals   1 tablet Oral Daily  . nicotine  14 mg Transdermal Daily  . pantoprazole  40 mg Oral Daily  . sodium chloride  3 mL Intravenous Q12H  . tamsulosin  0.4 mg Oral Daily  . thiamine  100 mg Oral Daily    Continuous Infusions:  none   Loyce Dys, MS RD LDN Clinical Inpatient Dietitian Pager: [phone] Weekend/After hours pager: [phone]

## 2013-09-11 NOTE — Progress Notes (Signed)
Physical Therapy Treatment Patient Details Name: Adam Orr MRN: [id number] DOB: 03-28-1966 Today's Date: 09/11/2013 Time: 9528-4132 PT Time Calculation (min): 13 min  PT Assessment / Plan / Recommendation  History of Present Illness Adam Orr is a 47 y.o. male who presents to the ED with depression, weakness, and essentially failure to thrive due to streight binge drinking for the past 2 weeks in a row.  History is per family.  Per them patient has lost up to 60 lbs in this time period, has had essentially no PO intake at all other than ethanol.  Patient is very depressed, likely in part because he stopped taking his anti-depressant medication because he could not drink EtOH while on this.   PT Comments   Able to progress ambulation today without the use of any assistive device. Patient still demonstrates instability and confusion. Will continue to work with patient on ambulation without device.   Follow Up Recommendations  SNF;Supervision/Assistance - 24 hour           Equipment Recommendations  Rolling walker with 5" wheels       Frequency Min 3X/week   Progress towards PT Goals Progress towards PT goals: Progressing toward goals  Plan Discharge plan needs to be updated    Precautions / Restrictions Precautions Precautions: Fall Restrictions Weight Bearing Restrictions: No   Pertinent Vitals/Pain Patient reports 5/10 pain in bilateral knees and feet    Mobility  Bed Mobility Bed Mobility: Not assessed Details for Bed Mobility Assistance: sitting EOB upon entering room Transfers Transfers: Sit to Stand;Stand to Sit Sit to Stand: 4: Min guard;From bed Stand to Sit: 4: Min guard;To chair/3-in-1 Details for Transfer Assistance: cueing for hand placement and controlled descent with each trial Ambulation/Gait Ambulation/Gait Assistance: 4: Min assist;3: Mod assist Ambulation Distance (Feet): 410 Feet Assistive device: None Ambulation/Gait Assistance Details:  assist for stability, multiple balance checks and at times loss of balance requiring increased mod assist Gait Pattern: Step-through pattern;Decreased stride length;Decreased hip/knee flexion - left;Decreased hip/knee flexion - right;Narrow base of support;Trunk flexed Gait velocity: decreased Stairs: No      PT Goals (current goals can now be found in the care plan section) Acute Rehab PT Goals Patient Stated Goal: return home PT Goal Formulation: With patient Time For Goal Achievement: 09/14/13 Potential to Achieve Goals: Fair  Visit Information  Last PT Received On: 09/11/13 Assistance Needed: +1 History of Present Illness: Adam Orr is a 47 y.o. male who presents to the ED with depression, weakness, and essentially failure to thrive due to streight binge drinking for the past 2 weeks in a row.  History is per family.  Per them patient has lost up to 60 lbs in this time period, has had essentially no PO intake at all other than ethanol.  Patient is very depressed, likely in part because he stopped taking his anti-depressant medication because he could not drink EtOH while on this.    Subjective Data  Subjective: Im just bored Patient Stated Goal: return home   Cognition  Cognition Arousal/Alertness: Awake/alert Behavior During Therapy: Flat affect Overall Cognitive Status: Impaired/Different from baseline Area of Impairment: Orientation;Safety/judgement Orientation Level: Time;Situation Memory: Decreased short-term memory Safety/Judgement: Decreased awareness of safety General Comments: Confusion, pt [place] and he would like to be escorted to the triple threat game that he was playing earlier this morning. Patient also describes meal he has had at the buffet this morning and reports that he was  allowed to visit home today.    Balance  Balance Balance Assessed: Yes Static Standing Balance Static Standing - Balance Support: Bilateral upper  extremity supported Static Standing - Level of Assistance: 4: Min assist Dynamic Standing Balance Dynamic Standing - Balance Support: No upper extremity supported;During functional activity Dynamic Standing - Level of Assistance: 3: Mod assist  End of Session PT - End of Session Equipment Utilized During Treatment: Gait belt Activity Tolerance: Patient tolerated treatment well Patient left: in chair;with call bell/phone within reach;with chair alarm set Nurse Communication: Mobility status   GP     Fabio Asa 09/11/2013, 11:51 AM Charlotte Crumb, PT DPT  [phone]

## 2013-09-11 NOTE — Discharge Summary (Addendum)
[facility]                                                                                   HAMID BROOKENS, is a 47 y.o. male  DOB [birth year]  MRN [id number].  Admission date:  08/30/2013  Admitting Physician  Hillary Bow, DO  Discharge Date:  09/12/2013   Primary MD  No PCP Per Patient  Admission Diagnosis  Dehydration [276.51] ETOH abuse [305.00] Generalized weakness [780.79]  Discharge Diagnosis     Principal Problem:   Generalized weakness Active Problems:   ETOH abuse   Protein-calorie malnutrition, severe     Past Medical History  Diagnosis Date  . CAD (coronary artery disease)     nonobstructive  . VF (ventricular fibrillation)     arrest  . 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 (doesn't say where LV goes to)   . 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 ICD (implantable cardiac defibrillator) in place     s/p ICD  . Automatic implantable cardioverter-defibrillator in situ   . Anxiety   . Depression     Past Surgical History  Procedure Laterality Date  . Icd implantation      with defibrillation threshold testing. [employer]     Recommendations for primary care physician for things to follow:   Please have patient follow with outpatient psychiatry and neurology on a close basis.   Discharge Diagnoses:   Principal Problem:   Generalized weakness Active Problems:   ETOH abuse   Protein-calorie malnutrition, severe    Discharge Condition: stable       Follow-up Information   Follow up with PCP and Psychiatrist of choice. Schedule an appointment as soon as possible for a visit in 1 week.      Follow up with [facility]. Schedule an appointment as soon as possible for a visit in 1 week.   Contact information:   [address] [place] [address] [phone]        Consults obtained - PT, S Work   Discharge Medications      Medication List    STOP taking these  medications       amLODipine 10 MG tablet  Commonly known as:  NORVASC      TAKE these medications       aspirin 325 MG EC tablet  Take 325 mg by mouth daily.     citalopram 20 MG tablet  Commonly known as:  CELEXA  Take 1 tablet (20 mg total) by mouth daily.     cyanocobalamin 1000 MCG/ML injection  Commonly known as:  (VITAMIN B-12)  Inject 1 mL (1,000 mcg total) into the muscle every 30 (thirty) days.     feeding supplement Liqd  Take 237 mLs by mouth 2 (two) times daily between meals.     folic acid 1 MG tablet  Commonly known as:  FOLVITE  Take 1 tablet (1 mg total) by mouth daily.     mirtazapine 15 MG disintegrating tablet  Commonly known as:  REMERON SOL-TAB  Take 1 tablet (15 mg total) by  mouth at bedtime.     nicotine 14 mg/24hr patch  Commonly known as:  NICODERM CQ - dosed in mg/24 hours  Place 1 patch onto the skin daily.     pantoprazole 40 MG tablet  Commonly known as:  PROTONIX  Take 1 tablet (40 mg total) by mouth daily.     tamsulosin 0.4 MG Caps capsule  Commonly known as:  FLOMAX  Take 1 capsule (0.4 mg total) by mouth daily.     thiamine 100 MG tablet  Take 1 tablet (100 mg total) by mouth daily.         Diet and Activity recommendation: See Discharge Instructions below   Discharge Instructions     Follow with Primary MD in 7 days   Get CBC, CMP, TSH, free T3, T4 checked 7 days by Primary MD and again as instructed by your Primary MD.   Get Medicines reviewed and adjusted.  Please request your Prim.MD to go over all Hospital Tests and Procedure/Radiological results at the follow up, please get all Hospital records sent to your Prim MD by signing hospital release before you go home.  Activity: As tolerated with Full fall precautions use walker/cane & assistance as needed   Diet: Heart Healthy  For Heart failure patients - Check your Weight same time everyday, if you gain over 2 pounds, or you develop in leg swelling, experience  more shortness of breath or chest pain, call your Primary MD immediately. Follow Cardiac Low Salt Diet and 1.8 lit/day fluid restriction.  Disposition SNF  If you experience worsening of your admission symptoms, develop shortness of breath, life threatening emergency, suicidal or homicidal thoughts you must seek medical attention immediately by calling 911 or calling your MD immediately  if symptoms less severe.  You Must read complete instructions/literature along with all the possible adverse reactions/side effects for all the Medicines you take and that have been prescribed to you. Take any new Medicines after you have completely understood and accpet all the possible adverse reactions/side effects.   Do not drive and provide baby sitting services if your were admitted for syncope or siezures until you have seen by Primary MD or a Neurologist and advised to do so again.  Do not drive when taking Pain medications.    Do not take more than prescribed Pain, Sleep and Anxiety Medications  Special Instructions: If you have smoked or chewed Tobacco  in the last 2 yrs please stop smoking, stop any regular Alcohol  and or any Recreational drug use.  Wear Seat belts while driving.   Please note  You were cared for by a hospitalist during your hospital stay. If you have any questions about your discharge medications or the care you received while you were in the hospital after you are discharged, you can call the unit and asked to speak with the hospitalist on call if the hospitalist that took care of you is not available. Once you are discharged, your primary care physician will handle any further medical issues. Please note that NO REFILLS for any discharge medications will be authorized once you are discharged, as it is imperative that you return to your primary care physician (or establish a relationship with a primary care physician if you do not have one) for your aftercare needs so that they  can reassess your need for medications and monitor your lab values.   Major procedures and Radiology Reports - PLEASE review detailed and final reports for all details, in brief -  Dg Chest 2 View  08/30/2013   *RADIOLOGY REPORT*  Clinical Data: Medical clearance.  Dehydration.  CHEST - 2 VIEW  Comparison: 12/26/2012  Findings: The heart size and pulmonary vascularity are normal and the lungs are clear.  Transvenous defibrillator in place.  No acute osseous abnormality.  IMPRESSION: No acute disease.   Original Report Authenticated By: Francene Boyers, M.D.   Ct Head Wo Contrast  08/30/2013   *RADIOLOGY REPORT*  Clinical Data: Generalized weakness  CT HEAD WITHOUT CONTRAST  Technique:  Contiguous axial images were obtained from the base of the skull through the vertex without contrast.  Comparison: 08/31/2010  Findings: There is no evidence for acute hemorrhage, hydrocephalus, or abnormal extra-axial fluid collection.  No definite CT evidence for acute infarction.  4 mm hyperattenuating focus in the region of the left foramen [place] may represent a tiny colloid cyst.  Diffuse loss of parenchymal volume is consistent with atrophy. Patchy low attenuation in the deep hemispheric and periventricular white matter is nonspecific, but likely reflects chronic microvascular ischemic demyelination.  IMPRESSION: No acute intracranial abnormality.  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  There is no evidence for hydrocephalus.  Follow up non emergent brain MRI without and with contrast could be used to further evaluate, as clinically warranted.  Band-like x-ray   Original Report Authenticated By: Kennith Center, M.D.    Micro Results      No results found for this or any previous visit (from the past 240 hour(s)).   History of present illness and  Hospital Course:     Kindly see H&P for history of present illness and admission details, please review complete Labs,  Consult reports and Test reports for all details in brief Adam Orr, is a 47 y.o. male, patient with history of  Alcohol and nicotine abuse, noncompliance with medications and physician followups,Who has history of severe alcohol abuse and usually goes on drinking binges was brought in by family members for generalized weakness, confusion and delirium which has been gradually progressive over several weeks to months.  In the hospital he was diagnosed with alcohol abuse induced Wernicke's encephalopathy and some psychosis along with underlying delirium and mild depression, he was started on IV thiamine supplementation along with folic acid, he was seen by psychiatry and started on below mentioned psych medications for his depression, his mental status, nystagmus and ataxia have improved, however he continues to be somewhat confused due to underlying delirium and probably long-standing neurological injury from sustained alcohol abuse in the past. Patient has had no focal neurological deficits, he will be transitioned to oral thiamine and folic acid, his B12 level was borderline and I will place him on the short months a month now he received 5 shots IM here.   Patient has been counseled excessively to abstain from alcohol and smoking, he will be placed in a rehabilitation facility per social worker. I have recommended followup with PCP, psychiatry and neurology post discharge within one to 2 weeks. Please monitor B12 levels along with CBC BMP on a close basis.  Patient has remote history of AICD placement secondary to V. Tach and cardiac arrest, his EF is is improved 55%. His blood pressures have been somewhat on the softer side and his Norvasc has been stopped.  An outpatient repeat CT scan in 4-6 weeks is recommended due to nonspecific findings on CT scan below. Note MRI cannot be done due to AICD.     Today  Subjective:   Shon Hough today has no headache,no chest abdominal pain,no new  weakness tingling or numbness, feels much better.   Objective:   Blood pressure 90/60 repeat 106/58, pulse 87, temperature 98.2 F (36.8 C), temperature source Oral, resp. rate 20, height 6\' 4"  (1.93 m), weight 57.561 kg (126 lb 14.4 oz), SpO2 97.00%.   Intake/Output Summary (Last 24 hours) at 09/12/13 0927 Last data filed at 09/12/13 0851  Gross per 24 hour  Intake   1680 ml  Output      0 ml  Net   1680 ml    Exam Awake, pleasantly confused, mild horizontal nystagmus, No new F.N deficits, Normal affect [place].AT,PERRAL Supple Neck,No JVD, No cervical lymphadenopathy appriciated.  Symmetrical Chest wall movement, Good air movement bilaterally, CTAB RRR,No Gallops,Rubs or new Murmurs, No Parasternal Heave +ve B.Sounds, Abd Soft, Non tender, No organomegaly appriciated, No rebound -guarding or rigidity. No Cyanosis, Clubbing or edema, No new Rash or bruise  Data Review   CBC w Diff: Lab Results  Component Value Date   WBC 4.9 09/10/2013   HGB 10.8* 09/10/2013   HCT 32.2* 09/10/2013   PLT 222 09/10/2013   LYMPHOPCT 12.6 02/16/2013   MONOPCT 6.0 02/16/2013   EOSPCT 0.2 02/16/2013   BASOPCT 0.1 02/16/2013    CMP: Lab Results  Component Value Date   NA 142 09/10/2013   K 3.9 09/10/2013   CL 106 09/10/2013   CO2 28 09/10/2013   BUN 5* 09/10/2013   CREATININE 0.66 09/10/2013   PROT 7.6 08/30/2013   ALBUMIN 4.0 08/30/2013   BILITOT 1.8* 08/30/2013   ALKPHOS 50 08/30/2013   AST 45* 08/30/2013   ALT 69* 08/30/2013  .   Total Time in preparing paper work, data evaluation and todays exam - 35 minutes  Leroy Sea M.D on 09/12/2013 at 9:27 AM  [facility] Office  [phone]

## 2013-09-11 NOTE — Progress Notes (Signed)
CSW searching for a bed. Will update when a facility is available.  Maree Krabbe, MSW, [facility] [phone]

## 2013-09-12 NOTE — Progress Notes (Signed)
[facility]                                                                                Patient Demographics  Adam Orr, is a 47 y.o. male, DOB - 1966-03-11, ZOX:[id number]  Admit date - 08/30/2013   Admitting Physician Hillary Bow, DO  Outpatient Primary MD for the patient is No PCP Per Patient  LOS - 13   Chief Complaint  Patient presents with  . Medical Clearance  . Dehydration        Assessment & Plan    Generalized weakness  1. Likely secondary to poor PO intake and malnutrition 2. Was orthostatic on presentation, s/p banana bag, bp currently stable, Strength is improving with PT 3. Await placement by social work, discharge has been done.     EtOH abuse  1. Patient again reminded he needs to quit drinking. 2. Cont with ETOH withdrawal protocol, although outside window now for DT's,Continue thiamine and folic acid supplementation 3. Psych was re-consulted - appreciate recs 4. Pt lacks decision making capacity.     Confusion - per psych not have the capacity to make his own decisions 1. 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 2. Also B12 is low normal and will place him on IV B12 for the next few days, and thereafter monthly upon discharge 3. Continue folic acid supplementation     Depression  1. Denies SI/HI. 2. Appreciate Psychiatry recs 3. Now on Celexa and remeron 4. Recent recs for outpatient psych treatment     Mildly elevated TSH. Due to his acute illness, free T3 and T4 are stable. Repeat TSH in 4 weeks time.      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 Last EF in 2013 is 55%.     Nonspecific findings on CT scan  Note MRI cannot be done due to his history of AICD outpatient followup with neurology  post discharge.      Code Status: full  Family Communication: mother  Disposition Plan: SNF , await his PASSAR number   Procedures CT Head, MRI if possible   Consults  Psych   DVT Prophylaxis   Heparin   Lab Results  Component Value Date   PLT 222 09/10/2013    Medications  Scheduled Meds: . aspirin  325 mg Oral Daily  . citalopram  20 mg Oral Daily  . cyanocobalamin  1,000 mcg Intramuscular Daily  . feeding supplement  237 mL Oral BID BM  . folic acid  1 mg Oral Daily  . heparin  5,000 Units Subcutaneous Q8H  . mirtazapine  15 mg Oral QHS  . multivitamin with minerals  1 tablet Oral Daily  . nicotine  14 mg Transdermal Daily  . pantoprazole  40 mg Oral Daily  . sodium chloride  3 mL Intravenous Q12H  . tamsulosin  0.4 mg Oral Daily  . thiamine  100 mg Oral Daily   Continuous Infusions:  PRN Meds:.  Antibiotics     Anti-infectives  None       Time Spent in minutes  35   Susa Raring K M.D on 09/12/2013 at 9:23 AM  Between 7am to 7pm - Pager - [phone]  After 7pm go to [email or web address] - password [email or web address]  And look for the night coverage person covering for me after hours  [facility] Office  [phone]    Subjective:   Adam Orr today has, No headache, No chest pain, No abdominal pain - No Nausea, No new weakness tingling or numbness, No Cough - SOB.    Objective:   Filed Vitals:   09/11/13 0647 09/11/13 1431 09/11/13 1956 09/12/13 0535  BP: 90/60 92/59 123/83 124/84  Pulse: 87 109 109 88  Temp: 98.2 F (36.8 C) 99 F (37.2 C) 98.6 F (37 C) 98.1 F (36.7 C)  TempSrc: Oral Oral Oral Oral  Resp: 20 20 20 20   Height:      Weight:      SpO2: 97% 100% 99% 99%    Wt Readings from Last 3 Encounters:  08/31/13 57.561 kg (126 lb 14.4 oz)  04/19/13 69.854 kg (154 lb)  02/16/13 71.124 kg (156 lb 12.8 oz)     Intake/Output Summary (Last 24 hours) at 09/12/13 0923 Last data filed at 09/12/13 0851  Gross per 24 hour   Intake   1680 ml  Output      0 ml  Net   1680 ml    Exam Awake , pleasantly confused, No new F.N deficits, Normal affect [place].AT,PERRAL, does have horizontal nystagmus bilaterally Supple Neck,No JVD, No cervical lymphadenopathy appriciated.  Symmetrical Chest wall movement, Good air movement bilaterally, CTAB RRR,No Gallops,Rubs or new Murmurs, No Parasternal Heave +ve B.Sounds, Abd Soft, Non tender, No organomegaly appriciated, No rebound - guarding or rigidity. No Cyanosis, Clubbing or edema, No new Rash or bruise     Data Review   Micro Results No results found for this or any previous visit (from the past 240 hour(s)).  Radiology Reports Dg Chest 2 View  08/30/2013   *RADIOLOGY REPORT*  Clinical Data: Medical clearance.  Dehydration.  CHEST - 2 VIEW  Comparison: 12/26/2012  Findings: The heart size and pulmonary vascularity are normal and the lungs are clear.  Transvenous defibrillator in place.  No acute osseous abnormality.  IMPRESSION: No acute disease.   Original Report Authenticated By: Francene Boyers, M.D.   Ct Head Wo Contrast  08/30/2013   *RADIOLOGY REPORT*  Clinical Data: Generalized weakness  CT HEAD WITHOUT CONTRAST  Technique:  Contiguous axial images were obtained from the base of the skull through the vertex without contrast.  Comparison: 08/31/2010  Findings: There is no evidence for acute hemorrhage, hydrocephalus, or abnormal extra-axial fluid collection.  No definite CT evidence for acute infarction.  4 mm hyperattenuating focus in the region of the left foramen [place] may represent a tiny colloid cyst.  Diffuse loss of parenchymal volume is consistent with atrophy. Patchy low attenuation in the deep hemispheric and periventricular white matter is nonspecific, but likely reflects chronic microvascular ischemic demyelination.  IMPRESSION: No acute intracranial abnormality.  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  There is no evidence for  hydrocephalus.  Follow up non emergent brain MRI without and with contrast could be used to further evaluate, as clinically warranted.  Band-like x-ray   Original Report Authenticated By: Kennith Center, M.D.    CBC  Recent Labs Lab 09/10/13 0445  WBC 4.9  HGB 10.8*  HCT  32.2*  PLT 222  MCV 101.9*  MCH 34.2*  MCHC 33.5  RDW 14.2    Chemistries   Recent Labs Lab 09/10/13 0445  NA 142  K 3.9  CL 106  CO2 28  GLUCOSE 82  BUN 5*  CREATININE 0.66  CALCIUM 9.9  MG 1.8   ------------------------------------------------------------------------------------------------------------------ estimated creatinine clearance is 93 ml/min (by C-G formula based on Cr of 0.66). ------------------------------------------------------------------------------------------------------------------ No results found for this basename: HGBA1C,  in the last 72 hours ------------------------------------------------------------------------------------------------------------------ No results found for this basename: CHOL, HDL, LDLCALC, TRIG, CHOLHDL, LDLDIRECT,  in the last 72 hours ------------------------------------------------------------------------------------------------------------------ No results found for this basename: TSH, T4TOTAL, FREET3, T3FREE, THYROIDAB,  in the last 72 hours ------------------------------------------------------------------------------------------------------------------ No results found for this basename: VITAMINB12, FOLATE, FERRITIN, TIBC, IRON, RETICCTPCT,  in the last 72 hours  Coagulation profile No results found for this basename: INR, PROTIME,  in the last 168 hours  No results found for this basename: DDIMER,  in the last 72 hours  Cardiac Enzymes No results found for this basename: CK, CKMB, TROPONINI, MYOGLOBIN,  in the last 168 hours ------------------------------------------------------------------------------------------------------------------ No  components found with this basename: POCBNP,

## 2013-09-12 NOTE — Progress Notes (Signed)
Bed confirmed at [facility] in [place] by Engineer, structural. CSW contacted patient's mom who states that she will have a family member transport him.  Patient's mother agreeable to this plan. CSW will assist with dc packet and sign off, as social work intervention is no longer needed.  Maree Krabbe, MSW, [facility] [phone]

## 2013-09-12 NOTE — Progress Notes (Signed)
Assistant CSW Supervisor is assisting CSW with looking for a facility. CSW will update when a bed is available.   Maree Krabbe, MSW, [facility] [phone]

## 2013-10-02 ENCOUNTER — Encounter: Payer: No Typology Code available for payment source | Admitting: *Deleted

## 2013-10-06 DIAGNOSIS — I4901 Ventricular fibrillation: Secondary | ICD-10-CM | POA: Diagnosis not present

## 2013-10-06 DIAGNOSIS — Z95810 Presence of automatic (implantable) cardiac defibrillator: Secondary | ICD-10-CM | POA: Diagnosis not present

## 2013-10-10 DIAGNOSIS — F411 Generalized anxiety disorder: Secondary | ICD-10-CM | POA: Diagnosis not present

## 2013-10-10 DIAGNOSIS — R627 Adult failure to thrive: Secondary | ICD-10-CM | POA: Diagnosis not present

## 2013-10-10 DIAGNOSIS — K219 Gastro-esophageal reflux disease without esophagitis: Secondary | ICD-10-CM | POA: Diagnosis not present

## 2013-10-10 DIAGNOSIS — N400 Enlarged prostate without lower urinary tract symptoms: Secondary | ICD-10-CM | POA: Diagnosis not present

## 2013-10-25 ENCOUNTER — Ambulatory Visit: Payer: No Typology Code available for payment source | Admitting: Cardiovascular Disease

## 2013-11-10 DIAGNOSIS — E538 Deficiency of other specified B group vitamins: Secondary | ICD-10-CM | POA: Diagnosis not present

## 2013-11-10 DIAGNOSIS — K219 Gastro-esophageal reflux disease without esophagitis: Secondary | ICD-10-CM | POA: Diagnosis not present

## 2013-11-10 DIAGNOSIS — R627 Adult failure to thrive: Secondary | ICD-10-CM | POA: Diagnosis not present

## 2013-11-10 DIAGNOSIS — F411 Generalized anxiety disorder: Secondary | ICD-10-CM | POA: Diagnosis not present

## 2013-11-21 DIAGNOSIS — F329 Major depressive disorder, single episode, unspecified: Secondary | ICD-10-CM | POA: Diagnosis not present

## 2013-11-21 DIAGNOSIS — R627 Adult failure to thrive: Secondary | ICD-10-CM | POA: Diagnosis not present

## 2013-11-21 DIAGNOSIS — R5381 Other malaise: Secondary | ICD-10-CM | POA: Diagnosis not present

## 2013-12-27 ENCOUNTER — Encounter: Payer: Self-pay | Admitting: Internal Medicine

## 2014-01-04 ENCOUNTER — Encounter (INDEPENDENT_AMBULATORY_CARE_PROVIDER_SITE_OTHER): Payer: Self-pay

## 2014-01-04 ENCOUNTER — Ambulatory Visit (INDEPENDENT_AMBULATORY_CARE_PROVIDER_SITE_OTHER): Payer: Medicaid Other | Admitting: *Deleted

## 2014-01-04 ENCOUNTER — Ambulatory Visit (INDEPENDENT_AMBULATORY_CARE_PROVIDER_SITE_OTHER): Payer: Medicare Other | Admitting: Neurology

## 2014-01-04 ENCOUNTER — Encounter: Payer: Self-pay | Admitting: Neurology

## 2014-01-04 VITALS — BP 128/76 | HR 80 | Ht 74.0 in | Wt 201.0 lb

## 2014-01-04 DIAGNOSIS — E538 Deficiency of other specified B group vitamins: Secondary | ICD-10-CM | POA: Diagnosis not present

## 2014-01-04 DIAGNOSIS — R413 Other amnesia: Secondary | ICD-10-CM

## 2014-01-04 DIAGNOSIS — Z0289 Encounter for other administrative examinations: Secondary | ICD-10-CM

## 2014-01-04 MED ORDER — CYANOCOBALAMIN 1000 MCG/ML IJ SOLN
1000.0000 ug | Freq: Once | INTRAMUSCULAR | Status: AC
  Administered 2014-01-04: 1000 ug via INTRAMUSCULAR

## 2014-01-04 MED ORDER — "NEEDLE (DISP) 25G X 1-1/2"" MISC": Status: DC

## 2014-01-04 MED ORDER — SYRINGE (DISPOSABLE) 3 ML MISC: 3.0000 mL | Freq: Once | Status: DC

## 2014-01-04 MED ORDER — CYANOCOBALAMIN 1000 MCG/ML IJ SOLN
INTRAMUSCULAR | Status: DC
Start: 2014-01-04 — End: 2014-05-17

## 2014-01-04 NOTE — Progress Notes (Signed)
[facility]  PATIENT: Adam Orr DOB: [birth year]  HISTORY: Mr. Adam Orr is a 48 years old right-handed Caucasian male, accompanied by his mother, referred byhis primary care physician Dr. Tonny Bollman for evaluation of memory loss.  He suffered past medical history of hypertension, coronary artery disease, alcohol abuse, depression,  He had a previous history of sudden onset of cardiac arrest due to ventricular arrhythmia in 2011, require life-support for 5 days, defibrillator was placed afterwards.  He could not longer drive, lost his previous stroke, he went into binge drink in early 2014,he was admitted to the hospital in September 2014 due to generalized weakness, dehydration, failure to thrive, he lost 60 pounds over 3 months period of time,later he was discharged to nursing home for rehabilitation,  He is able to walk better now, has better appetite, has regained a lot of weight  Mother who is a retired Designer, jewellery, reported that he has changed totally since that,he has lost a memory, he could still recognizes family members, but sometimes he was confused eighties grandmother, and his father has died, he thought that he was still in  [employer] instead of back home. He at has nocturnal incontinence sometimes,  Per mother, he was getting IV thiamine during his hospital stay  Laboratory in September 2014 showed low normal B12 222,elevated TSH 6 point 5 REVIEW OF SYSTEMS: Full 14 system review of systems performed and notable only for weight loss, achy muscles, depression, anxiety, decreased energy, disinterested in activities, memory loss,  ALLERGIES: Allergies  Allergen Reactions  . Penicillins Anaphylaxis  . Smallpox Vaccine Swelling    HOME MEDICATIONS: Outpatient Prescriptions Prior to Visit  Medication Sig Dispense Refill  . aspirin 325 MG EC tablet Take 325 mg by mouth daily.      . citalopram (CELEXA) 20 MG tablet Take 1 tablet (20 mg  total) by mouth daily.      . feeding supplement (ENSURE COMPLETE) LIQD Take 237 mLs by mouth 2 (two) times daily between meals.      . folic acid (FOLVITE) 1 MG tablet Take 1 tablet (1 mg total) by mouth daily.      . tamsulosin (FLOMAX) 0.4 MG CAPS capsule Take 1 capsule (0.4 mg total) by mouth daily.  30 capsule    . thiamine 100 MG tablet Take 1 tablet (100 mg total) by mouth daily.      . pantoprazole (PROTONIX) 40 MG tablet Take 1 tablet (40 mg total) by mouth daily.  30 tablet  11     PAST MEDICAL HISTORY: Past Medical History  Diagnosis Date  . CAD (coronary artery disease)   . VF (ventricular fibrillation) In 2011    arrest  . 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 (doesn't say where LV goes to)   . 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 ICD (implantable cardiac defibrillator) in place     s/p ICD  . Automatic implantable cardioverter-defibrillator in situ   . Anxiety   . Depression     PAST SURGICAL HISTORY: Past Surgical History  Procedure Laterality Date  . Icd implantation      with defibrillation threshold testing. St. Jude    FAMILY HISTORY:    SOCIAL HISTORY:  History   Social History  . Marital Status: Divorced    Spouse Name: N/A    Number of Children: 2  . Years of Education: N/A   Occupational History  Disabled  Optician, dispensing  .      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     Social History Main Topics  . Smoking status: Current Some Day Smoker    Types: Cigarettes  . Smokeless tobacco: Never Used     Comment: he continues to smoke occasionally, smoked for more than 20 years.   . Alcohol Use: 50.4 oz/week    84 Cans of beer per week  . Drug Use: No  . Sexual Activity: Not on file   Other Topics Concern  . Not on file   Social History Narrative   Divorced, 2 children.    Patient lives at home with his mother Adam Orr.   Patient was in [employer] for 12 years.      PHYSICAL EXAM   Filed Vitals:   01/04/14 1458  BP:  128/76  Pulse: 80  Height: 6\' 2"  (1.88 m)  Weight: 201 lb (91.173 kg)    Not recorded    Body mass index is 25.8 kg/(m^2).   Generalized: In no acute distress  Neck: Supple, no carotid bruits   Cardiac: Regular rate rhythm  Pulmonary: Clear to auscultation bilaterally  Musculoskeletal: No deformity  Neurological examination  Mentation: depressed-looking middle-aged male, dependent on his mother to provide history, Mini-Mental Status Examination is 83 out of 30 today, he missed 1 out of 3 recalls, is not oriented to date, and a day.  Cranial nerve II-XII: Pupils were equal round reactive to light extraocular movements were full, Visual field were full on confrontational test.he has mild bilateral end gaze horizontal nystagmus, to the gaze.  Bilateral fundi were sharp.  Facial sensation and strength were normal. Hearing was intact to finger rubbing bilaterally. Uvula tongue midline.  head turning and shoulder shrug and were normal and symmetric.Tongue protrusion into cheek strength was normal.  Motor: normal tone, bulk and strength.  Sensory: Intact to fine touch, pinprick, preserved vibratory sensation, and proprioception at toes.  Coordination: He has mild to moderate finger to nose, heel-to-shin bilaterally there was slight truncal ataxia  Gait: Rising up from seated position wide based, mild unsteady base, slight trunk ataxia  Romberg signs: Negative  Deep tendon reflexes: Brachioradialis 2/2, biceps 2/2, triceps 2/2, patellar 2/2, Achilles 2/2, plantar responses were flexor bilaterally.   DIAGNOSTIC DATA (LABS, IMAGING, TESTING) - I reviewed patient records, labs, notes, testing and imaging myself where available.  Lab Results  Component Value Date   WBC 4.9 09/10/2013   HGB 10.8* 09/10/2013   HCT 32.2* 09/10/2013   MCV 101.9* 09/10/2013   PLT 222 09/10/2013      Component Value Date/Time   NA 142 09/10/2013 0445   K 3.9 09/10/2013 0445   CL 106 09/10/2013 0445    CO2 28 09/10/2013 0445   GLUCOSE 82 09/10/2013 0445   BUN 5* 09/10/2013 0445   CREATININE 0.66 09/10/2013 0445   CALCIUM 9.9 09/10/2013 0445   PROT 7.6 08/30/2013 1747   ALBUMIN 4.0 08/30/2013 1747   AST 45* 08/30/2013 1747   ALT 69* 08/30/2013 1747   ALKPHOS 50 08/30/2013 1747   BILITOT 1.8* 08/30/2013 1747   GFRNONAA >90 09/10/2013 0445   GFRAA >90 09/10/2013 0445   Lab Results  Component Value Date   CHOL 203* 11/24/2010   HDL 54 11/24/2010   LDLCALC 110* 11/24/2010   TRIG 195* 11/24/2010   CHOLHDL 3.8 Ratio 11/24/2010   No results found for this basename: HGBA1C   Lab Results  Component Value Date  ZOXWRUEA54VITAMINB12 222 09/06/2013   Lab Results  Component Value Date   TSH 6.555* 09/06/2013      ASSESSMENT AND PLAN  48 years old Caucasian male, with history of depression anxiety, and binge alcohol consumption, require hospital admission in September 2014, due to confusion, weakness, failure to thrive, despite his general improvement, he continues to have significant memory trouble, occasionally nocturnal urinary incontinence, on examination he has slight trunk, mild to moderate limb ataxia. Significant memory trouble  1. Differentiation diagnosis including Wernicke's encephalopathy, He has low normal B12, I will start B12 im supplements, 2.    Proceed with laboratory evaluation, 3.  Thiamine supplements   Levert FeinsteinYijun Terry Bolotin, M.D. Ph.D.  [facility] [address], [address] [place], [place] [address] [phone]

## 2014-01-04 NOTE — Progress Notes (Signed)
Pt here for B 12 injection.  Under aseptic technique cyanocobalamin 1000mcg/1ml IM given R deltoid.  Tolerated well.  Bandaid applied.  

## 2014-01-04 NOTE — Patient Instructions (Signed)
Mother with pt.  She is retired Engineer, civil (consulting)nurse.  Can give injections.  Gave her instructions, relayed that Dr. Terrace ArabiaYan did place order for syringes, needles and single dose vials for b12 injections.  (1000mcg IM daily for 5 days, then once weekly for 4 wks, then once monthly).  Syringes 3cc, needles 25g 1 1/4 in. Pt and mother verbalized understanding.   Mychart activation code given to pt along with AVS.

## 2014-01-12 LAB — THYROID PANEL WITH TSH
Free Thyroxine Index: 1.6 (ref 1.2–4.9)
T3 Uptake Ratio: 27 % (ref 24–39)
T4, Total: 6 ug/dL (ref 4.5–12.0)
TSH: 6.33 u[IU]/mL — ABNORMAL HIGH (ref 0.450–4.500)

## 2014-01-12 LAB — FOLATE: Folate: >19.9 ng/mL (ref >3.0)

## 2014-01-12 LAB — THYROID PEROXIDASE ANTIBODY: Thyroid Peroxidase Ab: 14 IU/mL (ref 0–34)

## 2014-01-12 LAB — VITAMIN B1, WHOLE BLOOD: Thiamine: 226.6 nmol/L — ABNORMAL HIGH (ref 66.5–200.0)

## 2014-01-12 LAB — METHYLMALONIC ACID, SERUM: Methylmalonic Acid: 363 nmol/L (ref 0–378)

## 2014-01-12 LAB — THYROGLOBULIN ANTIBODY: Thyroglobulin Ab: <1 IU/mL (ref 0.0–0.9)

## 2014-01-12 LAB — COPPER, SERUM: Copper: 97 ug/dL (ref 72–166)

## 2014-01-12 LAB — VITAMIN B6: Vitamin B6: 6.9 ug/L (ref 5.3–46.7)

## 2014-01-12 LAB — HOMOCYSTEINE: Homocysteine: 12.2 umol/L (ref 0.0–15.0)

## 2014-01-12 LAB — VITAMIN D 1,25 DIHYDROXY: Vit D, 1,25-Dihydroxy: 60 pg/mL (ref 10.0–75.0)

## 2014-01-12 LAB — VITAMIN B12: Vitamin B-12: 216 pg/mL (ref 211–946)

## 2014-01-12 LAB — RPR: RPR: NONREACTIVE

## 2014-01-15 ENCOUNTER — Telehealth: Payer: Self-pay | Admitting: Neurology

## 2014-01-15 NOTE — Telephone Encounter (Signed)
Spoke to mother and relayed lab results, per Dr. Terrace ArabiaYan.  Patient is back on B12 injections.  Patient doesn't have a PCP, Dr. Tonny BollmanMichael Cooper is his cardiologist she will talk to him about the thyroid supplement, if not she will be in touch with us.  She is waiting to get him into a facility he has been under her care for 2 months.

## 2014-01-15 NOTE — Telephone Encounter (Signed)
Lupita LeashDonna:  Please call patient, laboratory evaluation continue to demonstrate low-normal B12, he should continue B12 IM supplement, in addition, TSH was elevated, suggestive of hypothyroidism, he should continue followup with his primary care physician Dr. Tonny BollmanMichael Cooper for thyroid supplement, and continued monitoring of the thyroid function,

## 2014-01-18 ENCOUNTER — Encounter: Payer: Self-pay | Admitting: Internal Medicine

## 2014-01-18 ENCOUNTER — Ambulatory Visit (INDEPENDENT_AMBULATORY_CARE_PROVIDER_SITE_OTHER): Payer: Medicare Other | Admitting: Internal Medicine

## 2014-01-18 VITALS — BP 126/88 | HR 89 | Ht 74.0 in | Wt 199.0 lb

## 2014-01-18 DIAGNOSIS — I4901 Ventricular fibrillation: Secondary | ICD-10-CM

## 2014-01-18 DIAGNOSIS — I201 Angina pectoris with documented spasm: Secondary | ICD-10-CM

## 2014-01-18 LAB — MDC_IDC_ENUM_SESS_TYPE_INCLINIC
Battery Remaining Longevity: 79.2 mo
Brady Statistic RV Percent Paced: 0 %
Date Time Interrogation Session: 20150122155702
HighPow Impedance: 74.25 Ohm
Implantable Pulse Generator Serial Number: 618386
Lead Channel Impedance Value: 400 Ohm
Lead Channel Pacing Threshold Amplitude: 1 V
Lead Channel Pacing Threshold Pulse Width: 0.5 ms
Lead Channel Sensing Intrinsic Amplitude: 11.6 mV
Lead Channel Setting Pacing Amplitude: 2 V
Lead Channel Setting Pacing Pulse Width: 0.5 ms
Lead Channel Setting Sensing Sensitivity: 0.5 mV
Zone Setting Detection Interval: 270 ms
Zone Setting Detection Interval: 340 ms

## 2014-01-18 MED ORDER — LEVOTHYROXINE SODIUM 25 MCG PO TABS: 25.0000 ug | ORAL_TABLET | Freq: Every day | ORAL | Status: DC

## 2014-01-18 NOTE — Patient Instructions (Signed)
Your physician wants you to follow-up in: 3 months in the device clinic and 12 months with Dr Johney FrameAllred Bonita QuinYou will receive a reminder letter in the mail two months in advance. If you don't receive a letter, please call our office to schedule the follow-up appointment.

## 2014-01-18 NOTE — Progress Notes (Signed)
Primary Cardiologist:  Dr Loura Pardon is a 48 y.o. male who presents today for routine electrophysiology followup. He has had significant cognative decline since his last visit.   This has been attributed to Wernicke's encephalopathy.  He now lives with his mother but will likely be moving to a nursing facility soon.  He is unable to provide history today.  Today, he denies symptoms of palpitations, chest pain,  lower extremity edema, dizziness, presyncope, syncope, or ICD shocks.  The patient is otherwise without complaint today.   Past Medical History  Diagnosis Date  . CAD (coronary artery disease)     nonobstructive  . VF (ventricular fibrillation)     arrest  . 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 (doesn't say where LV goes to)   . 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 ICD (implantable cardiac defibrillator) in place     s/p ICD  . Automatic implantable cardioverter-defibrillator in situ   . Anxiety   . Depression    Past Surgical History  Procedure Laterality Date  . Icd implantation      with defibrillation threshold testing. [employer]. Jude    Current Outpatient Prescriptions  Medication Sig Dispense Refill  . aspirin 325 MG EC tablet Take 325 mg by mouth daily.      . citalopram (CELEXA) 20 MG tablet Take 1 tablet (20 mg total) by mouth daily.      . cyanocobalamin (,VITAMIN B-12,) 1000 MCG/ML injection im qday x 5 days, then qweek x4 weeks, then im monthly  1 mL  12  . Divalproex Sodium (DEPAKOTE PO) Take 125 mg by mouth 2 (two) times daily.       . feeding supplement (ENSURE COMPLETE) LIQD Take 237 mLs by mouth 2 (two) times daily between meals.      . folic acid (FOLVITE) 1 MG tablet Take 1 tablet (1 mg total) by mouth daily.      Marland Kitchen LACTASE PO Take one tablespoon everyday      . LORazepam (ATIVAN) 1 MG tablet Take 1 mg by mouth 2 (two) times daily.      Marland Kitchen omeprazole (PRILOSEC) 20 MG capsule Take 20 mg by mouth  daily.      . tamsulosin (FLOMAX) 0.4 MG CAPS capsule Take 1 capsule (0.4 mg total) by mouth daily.  30 capsule    . thiamine 100 MG tablet Take 1 tablet (100 mg total) by mouth daily.      Marland Kitchen levothyroxine (SYNTHROID, LEVOTHROID) 25 MCG tablet Take 1 tablet (25 mcg total) by mouth daily before breakfast.  90 tablet  0  . [DISCONTINUED] isosorbide mononitrate (IMDUR) 30 MG 24 hr tablet Take 15 mg by mouth at bedtime.         No current facility-administered medications for this visit.    Physical Exam: Filed Vitals:   01/18/14 1552  BP: 126/88  Pulse: 89  Height: 6\' 2"  (1.88 m)  Weight: 199 lb (90.266 kg)    GEN- The patient is well appearing, alert but not oriented Head- normocephalic, atraumatic Eyes-  Sclera clear, conjunctiva pink Ears- hearing intact Oropharynx- clear Lungs- Clear to ausculation bilaterally, normal work of breathing Chest- ICD pocket is well healed Heart- Regular rate and rhythm, no murmurs, rubs or gallops, PMI not laterally displaced GI- soft, NT, ND, + BS Extremities- no clubbing, cyanosis, or edema  ICD interrogation- reviewed in detail today,  See PACEART  report  Assessment and Plan:  1.  Coronary artery spasm/ prior VF arrest No chest pain recently Normal ICD function See Pace Art report No changes today  2. Hypothyroidism Restart prior synthroid dosing until he can establish with primary care  Return to the device clinic in 3 months.  Once he has established a new residence in the nursing home, he may be better suited to restart remote monitoring then.  I will see in a year Follow-up with Dr Excell Seltzerooper as scheduled

## 2014-01-24 ENCOUNTER — Telehealth: Payer: Self-pay | Admitting: Neurology

## 2014-01-24 NOTE — Telephone Encounter (Signed)
Left message for patient's mom to call and confirm new appointment time, 04/05/14 to 04/26/14 per Dr Zannie CoveYan's schedule.

## 2014-01-24 NOTE — Telephone Encounter (Signed)
Patient's mom called to request an FL2 form to be filled out for patient. Please call patient's mom back.

## 2014-01-26 NOTE — Telephone Encounter (Signed)
Called patient's mother concerning the FL-2 form needing to be filled out. I asked the mother if she dropped off the form and she stated that she had not. Patient mother said that she would bring the form in, along with the $20 fee on Monday. I advised the patient's mother that if the patient has any other problems, questions or concerns to call the office. Patient's mother verbalized understanding.

## 2014-01-26 NOTE — Telephone Encounter (Signed)
Adam BeachKaren Orr (mother) was calling to check on the FL-2 form for her son Adam HuaDavid.  She stated that someone called her yesterday while she was out.  She also stated that the diagnosis of Dementia needs to be on the new FL-2 form so that she is able to get him into a facility.  Please call mother back at [phone].

## 2014-01-30 ENCOUNTER — Telehealth: Payer: Self-pay | Admitting: Neurology

## 2014-01-30 NOTE — Telephone Encounter (Signed)
It does not appear we have been prescribing this drug.  I called and spoke with Mom.  She said this was previously prescribed by Dr Alfonse Alpersugal (? On spelling).  She says Dr Terrace ArabiaYan is the only provider he currently has.  If approved, she would like the Rx for Lorazepam 1mg  BID [facility] [address].  Says she does not need a call back unless there is an issue refilling the med.   Dr Terrace ArabiaYan, would you like to write Rx for Lorazepam?  Please advise.  Thank you.

## 2014-01-30 NOTE — Telephone Encounter (Signed)
Pt's mother called in requesting a refill on LORazepam (ATIVAN) 1 MG tablet.  She stated she can come by and pick it up tomorrow.  Please call when ready.  Thank you.

## 2014-01-30 NOTE — Telephone Encounter (Signed)
Adam Orr, please let patients will, I would not write a prescription of lorazepam for him.

## 2014-02-01 DIAGNOSIS — Z0289 Encounter for other administrative examinations: Secondary | ICD-10-CM

## 2014-02-02 ENCOUNTER — Telehealth: Payer: Self-pay | Admitting: Neurology

## 2014-02-02 NOTE — Telephone Encounter (Signed)
I called the mother back and left a message on 02/04 at 8:25 am (please see previous note).  I called again. Spoke with mom.  She said there is something wrong with her voicemail and she is unable to retrieve them at this time.  She is aware they need to request Rx from PCP or other provider.

## 2014-02-02 NOTE — Telephone Encounter (Signed)
Pt's mother called back to check the status of the Ativan/Lorazepam 1mg  BID.  She states that he will be completely out on Monday.  She stated that no one has called her since her original call on 01-30-14. Please that message for reference.  Thank you

## 2014-02-13 ENCOUNTER — Telehealth: Payer: Self-pay | Admitting: *Deleted

## 2014-02-13 NOTE — Telephone Encounter (Signed)
Received

## 2014-02-13 NOTE — Telephone Encounter (Signed)
Called and LMVM for Dr. Jacquelyne BalintMcDermott CMA re: results.

## 2014-02-15 NOTE — Telephone Encounter (Signed)
I have reviewed her neuropsychology evaluation from Dr. Orie FishermanAdam McDermott, patient does has dementia, and significant impairment in his daily activity,  Will help him complete the paperwork needed for assisted-living placement

## 2014-02-16 ENCOUNTER — Telehealth: Payer: Self-pay | Admitting: Neurology

## 2014-02-16 DIAGNOSIS — F0390 Unspecified dementia without behavioral disturbance: Secondary | ICD-10-CM | POA: Insufficient documentation

## 2014-02-16 NOTE — Telephone Encounter (Signed)
I have talked with patient's mother, and also reviewed his neuropsychology evaluation, he has significant dementia, will help him finished paperwork for long-term placement.

## 2014-03-23 DIAGNOSIS — N400 Enlarged prostate without lower urinary tract symptoms: Secondary | ICD-10-CM | POA: Diagnosis not present

## 2014-03-23 DIAGNOSIS — F04 Amnestic disorder due to known physiological condition: Secondary | ICD-10-CM | POA: Diagnosis not present

## 2014-03-23 DIAGNOSIS — I2510 Atherosclerotic heart disease of native coronary artery without angina pectoris: Secondary | ICD-10-CM | POA: Diagnosis not present

## 2014-03-23 DIAGNOSIS — K219 Gastro-esophageal reflux disease without esophagitis: Secondary | ICD-10-CM | POA: Diagnosis not present

## 2014-04-05 ENCOUNTER — Ambulatory Visit: Payer: MEDICAID | Admitting: Neurology

## 2014-04-19 ENCOUNTER — Ambulatory Visit (INDEPENDENT_AMBULATORY_CARE_PROVIDER_SITE_OTHER): Payer: Medicare Other | Admitting: *Deleted

## 2014-04-19 DIAGNOSIS — I4901 Ventricular fibrillation: Secondary | ICD-10-CM | POA: Diagnosis not present

## 2014-04-19 DIAGNOSIS — I2510 Atherosclerotic heart disease of native coronary artery without angina pectoris: Secondary | ICD-10-CM | POA: Diagnosis not present

## 2014-04-19 DIAGNOSIS — E039 Hypothyroidism, unspecified: Secondary | ICD-10-CM | POA: Diagnosis not present

## 2014-04-19 DIAGNOSIS — N400 Enlarged prostate without lower urinary tract symptoms: Secondary | ICD-10-CM | POA: Diagnosis not present

## 2014-04-19 LAB — MDC_IDC_ENUM_SESS_TYPE_INCLINIC
Battery Remaining Longevity: 80.4 mo
Brady Statistic RV Percent Paced: 0 %
Date Time Interrogation Session: 20150423194004
HighPow Impedance: 69.75 Ohm
Implantable Pulse Generator Serial Number: 618386
Lead Channel Impedance Value: 400 Ohm
Lead Channel Pacing Threshold Amplitude: 1 V
Lead Channel Pacing Threshold Amplitude: 1 V
Lead Channel Pacing Threshold Pulse Width: 0.5 ms
Lead Channel Pacing Threshold Pulse Width: 0.5 ms
Lead Channel Sensing Intrinsic Amplitude: 10.9 mV
Lead Channel Setting Pacing Amplitude: 2.5 V
Lead Channel Setting Pacing Pulse Width: 0.5 ms
Lead Channel Setting Sensing Sensitivity: 0.5 mV
Zone Setting Detection Interval: 270 ms
Zone Setting Detection Interval: 340 ms

## 2014-04-20 NOTE — Progress Notes (Signed)
ICD check in clinic. Normal device function. Threshold and sensing consistent with previous device measurements. Impedance trends stable over time. No evidence of any ventricular arrhythmias. Histogram distribution appropriate for patient and level of activity. Heart failure trends appear slighlty unstable w/o signs of acute distress. No changes made this session. Device programmed at appropriate safety margins. Device programmed to optimize intrinsic conduction. Estimated longevity 6.7 years. Plan to follow up with the device clinic on 7-23 @ 4:30pm and with JA in January 2016.

## 2014-04-23 ENCOUNTER — Telehealth: Payer: Self-pay

## 2014-04-23 NOTE — Telephone Encounter (Signed)
Message copied by Iona CoachBROWN, Homero Hyson W on Mon Apr 23, 2014  3:56 PM ------      Message from: Sebastian AcheSAUNDERS, SHAKILA S      Created: Mon Apr 23, 2014  3:34 PM      Regarding: Patient needs advice       Good afternoon,             This patient was seen in the device clinic last week and mother wants to know whether or not he would need to take antibiotics prior to dental procedures. He does not have a procedure scheduled at present and she is not quite sure what type of procedure he will have done when it's time, but she would like Dr.Rowe contacted with this information.                        Thanks,                   Trudi IdaShakila Saunders ------

## 2014-04-23 NOTE — Telephone Encounter (Signed)
Per Dr Excell Seltzerooper no SBE required.

## 2014-04-23 NOTE — Telephone Encounter (Signed)
I spoke with the pt's mother and made her aware that the pt does not require antibiotics prior to dental procedures.  She would like a note mailed to her home so that she can give this to Dr Phylliss Bobowe. I will mail a letter.

## 2014-04-25 ENCOUNTER — Encounter: Payer: Self-pay | Admitting: Internal Medicine

## 2014-04-26 ENCOUNTER — Ambulatory Visit: Payer: Medicare Other | Admitting: Neurology

## 2014-05-17 ENCOUNTER — Encounter (INDEPENDENT_AMBULATORY_CARE_PROVIDER_SITE_OTHER): Payer: Self-pay

## 2014-05-17 ENCOUNTER — Encounter: Payer: Self-pay | Admitting: Neurology

## 2014-05-17 ENCOUNTER — Ambulatory Visit (INDEPENDENT_AMBULATORY_CARE_PROVIDER_SITE_OTHER): Payer: Medicare Other | Admitting: Neurology

## 2014-05-17 VITALS — BP 129/78 | HR 88 | Ht 62.0 in | Wt 223.0 lb

## 2014-05-17 DIAGNOSIS — I201 Angina pectoris with documented spasm: Secondary | ICD-10-CM

## 2014-05-17 DIAGNOSIS — R413 Other amnesia: Secondary | ICD-10-CM | POA: Diagnosis not present

## 2014-05-17 DIAGNOSIS — I4901 Ventricular fibrillation: Secondary | ICD-10-CM

## 2014-05-17 MED ORDER — CYANOCOBALAMIN 1000 MCG/ML IJ SOLN: INTRAMUSCULAR | Status: DC

## 2014-05-17 NOTE — Progress Notes (Signed)
[facility]  PATIENT: Adam Orr DOB: 10-25-1966  HISTORY: ( Initial visit was Jan 8th 2015) Mr. Adam Orr is a 48 years old right-handed Caucasian male, accompanied by his mother, referred byhis primary care physician Dr. Tonny BollmanMichael Cooper for evaluation of memory loss.  He suffered past medical history of hypertension, coronary artery disease, alcohol abuse, depression,  He had a previous history of sudden onset of cardiac arrest due to ventricular arrhythmia in 2011, require life-support for 5 days, defibrillator was placed afterwards.  He could not longer drive, due to his previous cardiac arrest, he went into binge drink in early 2014,he was admitted to the hospital in September 2014 due to generalized weakness, dehydration, failure to thrive, he lost 60 pounds over 3 months period of time,later he was discharged to nursing home for rehabilitation, He is able to walk better now, has better appetite, has regained a lot of weight  Mother who is a retired Designer, jewelleryregistered nurse, reported that he has changed totally since that,he has lost a memory, he could still recognizes family members, but sometimes he was confused eighties grandmother, and his father has died, he thought that he was still in  [employer] instead of back home. He at has nocturnal incontinence sometimes,  Per mother, he was getting IV thiamine during his hospital stay  Laboratory in September 2014 showed low normal B12 222,elevated TSH 6 point 5  UPDATE May 17 2014:  He still lives with his mother, no change, "he can walk", he can dress, bath, eat, he is going to see dentist for his bad teeth, his primary care physician is Dr. Pearson GrippeJames Kim now, who is renewing his Ativan 0. 5 mg once or twice a day, he is also taking Depakote 125 mg once daily for depression, He could get very nervous and agitated,  He is now on thyroid supplement.   He has good appetite, he is attending adult enrichment center, he sleeps  well, " he is agreeable", He could not be left alone.  He has some worsening gait difficulty, complains of low back pain,  REVIEW OF SYSTEMS: Full 14 system review of systems performed and notable only for low back pain, gait difficulty, memory loss, confusion, depression   ALLERGIES: Allergies  Allergen Reactions  . Penicillins Anaphylaxis  . Smallpox Vaccine Swelling    HOME MEDICATIONS: Outpatient Prescriptions Prior to Visit  Medication Sig Dispense Refill  . aspirin 325 MG EC tablet Take 325 mg by mouth daily.      . citalopram (CELEXA) 20 MG tablet Take 1 tablet (20 mg total) by mouth daily.      . feeding supplement (ENSURE COMPLETE) LIQD Take 237 mLs by mouth 2 (two) times daily between meals.      . folic acid (FOLVITE) 1 MG tablet Take 1 tablet (1 mg total) by mouth daily.      . tamsulosin (FLOMAX) 0.4 MG CAPS capsule Take 1 capsule (0.4 mg total) by mouth daily.  30 capsule    . thiamine 100 MG tablet Take 1 tablet (100 mg total) by mouth daily.      . pantoprazole (PROTONIX) 40 MG tablet Take 1 tablet (40 mg total) by mouth daily.  30 tablet  11     PAST MEDICAL HISTORY: Past Medical History  Diagnosis Date  . CAD (coronary artery disease)   . VF (ventricular fibrillation) In 2011    arrest  . 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 (doesn't say where LV goes to)   . 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 ICD (implantable cardiac defibrillator) in place     s/p ICD  . Automatic implantable cardioverter-defibrillator in situ   . Anxiety   . Depression     PAST SURGICAL HISTORY: Past Surgical History  Procedure Laterality Date  . Icd implantation      with defibrillation threshold testing. St. Jude    FAMILY HISTORY:    SOCIAL HISTORY:  History   Social History  . Marital Status: Divorced    Spouse Name: N/A    Number of Children: 2  . Years of Education: N/A   Occupational History    Disabled  Optician, dispensingelectronics  .      [employer] 12 years.     Social History Main Topics  . Smoking status: Current Some Day Smoker    Types: Cigarettes  . Smokeless tobacco: Never Used     Comment: he continues to smoke occasionally, smoked for more than 20 years.   . Alcohol Use: 50.4 oz/week    84 Cans of beer per week  . Drug Use: No  . Sexual Activity: Not on file   Other Topics Concern  . Not on file   Social History Narrative   Divorced, 2 children.    Patient lives at home with his mother Adam Orr.   Patient was in [employer] for 12 years.      PHYSICAL EXAM   Filed Vitals:   05/17/14 1450  BP: 129/78  Pulse: 88  Height: 5\' 2"  (1.575 m)  Weight: 223 lb (101.152 kg)    Not recorded    Body mass index is 40.78 kg/(m^2).   Generalized: In no acute distress  Neck: Supple, no carotid bruits   Cardiac: Regular rate rhythm  Pulmonary: Clear to auscultation bilaterally  Musculoskeletal: No deformity  Neurological examination  Mentation: depressed-looking middle-aged male, dependent on his mother to provide history, Mini-Mental Status Examination is 1627 out of 30 today, he missed 1 out of 3 recalls, is not oriented to date, and a day.  Cranial nerve II-XII: Pupils were equal round reactive to light extraocular movements were full, Visual field were full on confrontational test.he has mild bilateral end gaze horizontal nystagmus, to the gaze.  Bilateral fundi were sharp.  Facial sensation and strength were normal. Hearing was intact to finger rubbing bilaterally. Uvula tongue midline.  head turning and shoulder shrug and were normal and symmetric.Tongue protrusion into cheek strength was normal.  Motor: normal tone, bulk and strength.  Sensory: Intact to fine touch, pinprick, preserved vibratory sensation, and proprioception at toes.  Coordination: He has mild to moderate finger to nose, heel-to-shin bilaterally there was slight truncal ataxia  Gait: Rising up from seated position wide based,  mild unsteady base, slight trunk ataxia, atalgic gait Romberg signs: Negative  Deep tendon reflexes: Brachioradialis 2/2, biceps 2/2, triceps 2/2, patellar 2/2, Achilles 2/2, plantar responses were flexor bilaterally.   DIAGNOSTIC DATA (LABS, IMAGING, TESTING) - I reviewed patient records, labs, notes, testing and imaging myself where available.  Lab Results  Component Value Date   WBC 4.9 09/10/2013   HGB 10.8* 09/10/2013   HCT 32.2* 09/10/2013   MCV 101.9* 09/10/2013   PLT 222 09/10/2013      Component Value Date/Time   NA 142 09/10/2013 0445   K 3.9 09/10/2013 0445   CL 106 09/10/2013 0445   CO2 28 09/10/2013  0445   GLUCOSE 82 09/10/2013 0445   BUN 5* 09/10/2013 0445   CREATININE 0.66 09/10/2013 0445   CALCIUM 9.9 09/10/2013 0445   PROT 7.6 08/30/2013 1747   ALBUMIN 4.0 08/30/2013 1747   AST 45* 08/30/2013 1747   ALT 69* 08/30/2013 1747   ALKPHOS 50 08/30/2013 1747   BILITOT 1.8* 08/30/2013 1747   GFRNONAA >90 09/10/2013 0445   GFRAA >90 09/10/2013 0445   Lab Results  Component Value Date   CHOL 203* 11/24/2010   HDL 54 11/24/2010   LDLCALC 110* 11/24/2010   TRIG 195* 11/24/2010   CHOLHDL 3.8 Ratio 11/24/2010   No results found for this basename: HGBA1C   Lab Results  Component Value Date   VITAMINB12 216 01/04/2014   Lab Results  Component Value Date   TSH 6.330* 01/04/2014    ASSESSMENT AND PLAN  48 years old Caucasian male, with history of depression anxiety, and binge alcohol consumption, require hospital admission in September 2014, due to confusion, weakness, failure to thrive, despite his general improvement, he continues to have significant memory trouble, occasionally nocturnal urinary incontinence, on examination he has slight trunk, mild to moderate limb ataxia. Significant memory trouble, most consistent with Wernicke's encephalopathy, He has low normal B12, he should continue B12 im supplements,  Return to clinic in one year with Eber Jones, alternating between me and Eber Jones  on his yearly or every 6 months followup  Levert Feinstein, M.D. Ph.D.  [facility] [address], [address] [place], [place] [address] [phone]

## 2014-05-30 DIAGNOSIS — E039 Hypothyroidism, unspecified: Secondary | ICD-10-CM | POA: Diagnosis not present

## 2014-05-30 DIAGNOSIS — N400 Enlarged prostate without lower urinary tract symptoms: Secondary | ICD-10-CM | POA: Diagnosis not present

## 2014-05-30 DIAGNOSIS — I2510 Atherosclerotic heart disease of native coronary artery without angina pectoris: Secondary | ICD-10-CM | POA: Diagnosis not present

## 2014-05-30 DIAGNOSIS — K219 Gastro-esophageal reflux disease without esophagitis: Secondary | ICD-10-CM | POA: Diagnosis not present

## 2014-07-27 ENCOUNTER — Encounter: Payer: Self-pay | Admitting: Internal Medicine

## 2014-08-01 ENCOUNTER — Ambulatory Visit (INDEPENDENT_AMBULATORY_CARE_PROVIDER_SITE_OTHER): Payer: Medicare Other | Admitting: *Deleted

## 2014-08-01 DIAGNOSIS — I4901 Ventricular fibrillation: Secondary | ICD-10-CM | POA: Diagnosis not present

## 2014-08-01 LAB — MDC_IDC_ENUM_SESS_TYPE_INCLINIC
Battery Remaining Longevity: 76.8 mo
Brady Statistic RV Percent Paced: 0 %
Date Time Interrogation Session: 20150805185818
HighPow Impedance: 76.5 Ohm
Implantable Pulse Generator Serial Number: 618386
Lead Channel Impedance Value: 387.5 Ohm
Lead Channel Pacing Threshold Amplitude: 0.75 V
Lead Channel Pacing Threshold Amplitude: 0.75 V
Lead Channel Pacing Threshold Pulse Width: 0.5 ms
Lead Channel Pacing Threshold Pulse Width: 0.5 ms
Lead Channel Sensing Intrinsic Amplitude: 10.5 mV
Lead Channel Setting Pacing Amplitude: 2.5 V
Lead Channel Setting Pacing Pulse Width: 0.5 ms
Lead Channel Setting Sensing Sensitivity: 0.5 mV
Zone Setting Detection Interval: 270 ms
Zone Setting Detection Interval: 340 ms

## 2014-08-02 NOTE — Progress Notes (Signed)
ICD check in clinic. Normal device function. Threshold and sensing consistent with previous device measurements. Impedance trends stable over time. No evidence of any ventricular arrhythmias. Stable thoracic impedance. Histogram distribution appropriate for patient and level of activity. No changes made this session. Device programmed at appropriate safety margins. Device programmed to optimize intrinsic conduction. Estimated longevity 6.4 years. Plan to follow up with the Device Clinic in 3 months and with JA in 12-2014.

## 2014-08-13 ENCOUNTER — Encounter: Payer: Self-pay | Admitting: Internal Medicine

## 2014-11-07 ENCOUNTER — Ambulatory Visit (INDEPENDENT_AMBULATORY_CARE_PROVIDER_SITE_OTHER): Payer: Medicare Other | Admitting: *Deleted

## 2014-11-07 DIAGNOSIS — Z23 Encounter for immunization: Secondary | ICD-10-CM | POA: Diagnosis not present

## 2014-11-07 DIAGNOSIS — I4901 Ventricular fibrillation: Secondary | ICD-10-CM | POA: Diagnosis not present

## 2014-11-07 LAB — MDC_IDC_ENUM_SESS_TYPE_INCLINIC
Battery Remaining Longevity: 74.4 mo
Brady Statistic RV Percent Paced: 0 %
Date Time Interrogation Session: 20151111164238
HighPow Impedance: 81 Ohm
Implantable Pulse Generator Serial Number: 618386
Lead Channel Impedance Value: 387.5 Ohm
Lead Channel Pacing Threshold Amplitude: 0.75 V
Lead Channel Pacing Threshold Amplitude: 0.75 V
Lead Channel Pacing Threshold Pulse Width: 0.5 ms
Lead Channel Pacing Threshold Pulse Width: 0.5 ms
Lead Channel Sensing Intrinsic Amplitude: 8.6 mV
Lead Channel Setting Pacing Amplitude: 2.5 V
Lead Channel Setting Pacing Pulse Width: 0.5 ms
Lead Channel Setting Sensing Sensitivity: 0.5 mV
Zone Setting Detection Interval: 270 ms
Zone Setting Detection Interval: 340 ms

## 2014-11-07 NOTE — Progress Notes (Signed)
ICD check in clinic. Normal device function. Thresholds and sensing consistent with previous device measurements. Impedance trends stable over time. No evidence of any ventricular arrhythmias.  Histogram distribution appropriate for patient and level of activity.  CorVue abnormal for 7 days in mid October.   No changes made this session. Device programmed at appropriate safety margins. Device programmed to optimize intrinsic conduction. Estimated longevity 6.1 years.  Patient education completed including shock plan. Alert tones/vibration demonstrated for patient.  ROV in January with Dr. Johney FrameAllred.

## 2014-11-21 DIAGNOSIS — R5383 Other fatigue: Secondary | ICD-10-CM | POA: Diagnosis not present

## 2014-11-21 DIAGNOSIS — E039 Hypothyroidism, unspecified: Secondary | ICD-10-CM | POA: Diagnosis not present

## 2014-11-21 DIAGNOSIS — I2510 Atherosclerotic heart disease of native coronary artery without angina pectoris: Secondary | ICD-10-CM | POA: Diagnosis not present

## 2014-11-21 DIAGNOSIS — A421 Abdominal actinomycosis: Secondary | ICD-10-CM | POA: Diagnosis not present

## 2014-11-21 DIAGNOSIS — Z79899 Other long term (current) drug therapy: Secondary | ICD-10-CM | POA: Diagnosis not present

## 2014-11-26 ENCOUNTER — Encounter: Payer: Self-pay | Admitting: Internal Medicine

## 2014-11-28 DIAGNOSIS — F04 Amnestic disorder due to known physiological condition: Secondary | ICD-10-CM | POA: Diagnosis not present

## 2014-11-28 DIAGNOSIS — I2510 Atherosclerotic heart disease of native coronary artery without angina pectoris: Secondary | ICD-10-CM | POA: Diagnosis not present

## 2014-11-28 DIAGNOSIS — K219 Gastro-esophageal reflux disease without esophagitis: Secondary | ICD-10-CM | POA: Diagnosis not present

## 2014-11-28 DIAGNOSIS — E039 Hypothyroidism, unspecified: Secondary | ICD-10-CM | POA: Diagnosis not present

## 2014-12-11 DIAGNOSIS — I214 Non-ST elevation (NSTEMI) myocardial infarction: Secondary | ICD-10-CM

## 2014-12-11 DIAGNOSIS — R079 Chest pain, unspecified: Secondary | ICD-10-CM

## 2014-12-11 DIAGNOSIS — I209 Angina pectoris, unspecified: Secondary | ICD-10-CM

## 2014-12-11 DIAGNOSIS — I2510 Atherosclerotic heart disease of native coronary artery without angina pectoris: Secondary | ICD-10-CM

## 2014-12-12 DIAGNOSIS — I214 Non-ST elevation (NSTEMI) myocardial infarction: Secondary | ICD-10-CM

## 2014-12-12 DIAGNOSIS — I2510 Atherosclerotic heart disease of native coronary artery without angina pectoris: Secondary | ICD-10-CM

## 2014-12-18 ENCOUNTER — Encounter (INDEPENDENT_AMBULATORY_CARE_PROVIDER_SITE_OTHER): Payer: Self-pay | Admitting: Cardiovascular Disease

## 2014-12-18 ENCOUNTER — Ambulatory Visit (INDEPENDENT_AMBULATORY_CARE_PROVIDER_SITE_OTHER): Payer: No Typology Code available for payment source | Admitting: Cardiovascular Disease

## 2014-12-18 VITALS — BP 122/80 | HR 55 | Ht 70.0 in | Wt 198.2 lb

## 2014-12-18 DIAGNOSIS — Z95820 Peripheral vascular angioplasty status with implants and grafts: Secondary | ICD-10-CM

## 2014-12-18 DIAGNOSIS — E785 Hyperlipidemia, unspecified: Secondary | ICD-10-CM | POA: Insufficient documentation

## 2014-12-18 DIAGNOSIS — I214 Non-ST elevation (NSTEMI) myocardial infarction: Secondary | ICD-10-CM | POA: Insufficient documentation

## 2014-12-18 DIAGNOSIS — I2510 Atherosclerotic heart disease of native coronary artery without angina pectoris: Secondary | ICD-10-CM | POA: Insufficient documentation

## 2014-12-18 DIAGNOSIS — Z959 Presence of cardiac and vascular implant and graft, unspecified: Secondary | ICD-10-CM

## 2014-12-18 NOTE — Assessment & Plan Note (Signed)
Relevant Hx:  Course:  Daily Update:  Today's Plan:

## 2014-12-18 NOTE — Progress Notes (Signed)
[facility]   Cardiology    Patient Name:  Raymond Cook  Date: 12/18/2014  Patient Number:  [id number]  DOB:  02-03-1966  Gender: male    Chief Complaint   Patient presents with   . Coronary Artery Disease     s/p [facility] Discharge for NSTEMI. Feeling fine. no symptoms.       Assessment and Plan  Life style changes and risk modifications d/w pt   Continue current care   F/u ECHO FOR EF post MI in 4 months   F/u PCP  Patient Active Problem List    Diagnosis Date Noted   . NSTEMI (non-ST elevated myocardial infarction)    . Coronary artery disease      DES to LAD,occ small Diag,40-50% OM,40% PDA,EF 55% with apical hypok,cath 12/12/2014  CAD Therapeutic goals discussed with patient and provided information     . Hyperlipidemia  Lipid profile and guideline goals with therapeutic and life style changes were discussed        Orders Placed This Encounter   Procedures   . ECG 12 lead (Today)   . Echocardiogram Adult Complete W Clr/ Dopp Waveform     Standing Status: Future      Number of Occurrences:       Standing Expiration Date: 12/19/2015     Order Specific Question:  Which group should read the exam?     Answer:  N/A       History of Present Illness   F/u hosp after MI  Has no  Cardiac Sx    No Known Allergies    Past Medical History   Diagnosis Date   . NSTEMI (non-ST elevated myocardial infarction)    . Coronary artery disease    . Hyperlipidemia        Past Surgical History   Procedure Laterality Date   . Coronary angioplasty         Family History   Problem Relation Age of Onset   . Breast cancer Mother    . Diabetes Father      type I   . Leukemia Father                Medications:   Current Outpatient Prescriptions   Medication Sig Dispense Refill   . aspirin EC 81 MG EC tablet Take 81 mg by mouth daily.     Marland Kitchen atorvastatin (LIPITOR) 80 MG tablet Take 80 mg by mouth daily.     Marland Kitchen esomeprazole (NEXIUM) 20 MG capsule Take 20 mg by mouth every morning before breakfast.     . lisinopril (PRINIVIL,ZESTRIL) 10 MG  tablet Take 10 mg by mouth daily.     . metoprolol (LOPRESSOR) 25 MG tablet Take 25 mg by mouth 2 (two) times daily.     . ticagrelor (BRILINTA) 90 MG Tab Take 90 mg by mouth every 12 (twelve) hours.       No current facility-administered medications for this visit.       Physical Exam:     Filed Vitals:    12/18/14 1355   BP: 122/80   Pulse: 55   Height: 1.778 m (5\' 10" )   Weight: 89.903 kg (198 lb 3.2 oz)   Body surface area is 2.11 meters squared.  Body mass index is 28.44 kg/(m^2).    Constitutional: well developed, nourished, no distress and alert and oriented x 3   HENT: atraumatic, nose normal, normocephalic, left exterior ear normal, right external ear normal and  oropharynx clear and moist   Eyes: conjunctiva normal, EOM normal and PERRL   Neck: ROM normal, supple and trachea normal   Vascular WNL, no edema, normal pulses , no cellulitis   Cardiovascular: heart sounds normal, intact distal pulses, normal rate, regular rhythm and no murmur   Pulmonary/Chest Wall: breath sounds normal, effort normal and no rales   Abdominal: appearance normal, bowel sounds normal, soft and non-tender   Musculoskeletal: normal ROM, normal tone and strength   Neurological: awake, alert and oriented x 3 and gait normal, cranial nerves intact, motor and sensory normal   Skin: dry, intact and warm     LABS:  Lipid Panel No results found for: CHOL, TRIG, HDL  CBC No results found for: WBC, HGB, HCT, MCV, PLT   BMPNo results found for: GLUCOSE, SODIUM, POTASSIUM, CHLORIDE, CO2, BUN  INR No results found for: INR, PROTIME    REVIEW OF SYSTEMS: All other systems reviewed and negative except as stated above.    Electronically signed by: Miachel Roux, MD 12/18/2014 2:42 PM

## 2014-12-25 DIAGNOSIS — I214 Non-ST elevation (NSTEMI) myocardial infarction: Secondary | ICD-10-CM

## 2014-12-25 DIAGNOSIS — R0789 Other chest pain: Secondary | ICD-10-CM

## 2014-12-31 ENCOUNTER — Telehealth (INDEPENDENT_AMBULATORY_CARE_PROVIDER_SITE_OTHER): Payer: Self-pay | Admitting: Cardiovascular Disease

## 2014-12-31 NOTE — Telephone Encounter (Signed)
Pt thinks he is having a reaction to the medication he was put on. Ph# [phone].    Thanks,  cw

## 2015-01-01 NOTE — Telephone Encounter (Signed)
Called patient back to find out what type of sx/reactions he is having and what medication he thinks it is

## 2015-01-02 ENCOUNTER — Telehealth (INDEPENDENT_AMBULATORY_CARE_PROVIDER_SITE_OTHER): Payer: Self-pay | Admitting: Cardiovascular Disease

## 2015-01-02 NOTE — Telephone Encounter (Signed)
i sent a message to [facility] awaiting approval and then I can have some one call and schedule

## 2015-01-02 NOTE — Telephone Encounter (Signed)
Could you please ask Dr. Aneta Mins if patient could have an ETT. He needs one for cardiac rehab.    Thanks,  cw

## 2015-01-02 NOTE — Telephone Encounter (Signed)
I opened the chart and it would not let me order the test

## 2015-01-02 NOTE — Telephone Encounter (Signed)
Patient needs a ETT for cardiac rehab, can you please review chart and put in a order so we can call the patient to schedule.      Thanks,  Serina Cowper

## 2015-01-02 NOTE — Telephone Encounter (Signed)
Please call [facility] ph# [phone] to clarify Metoprolol medication. Last seen 12/18/14.    Thanks,  cw

## 2015-01-03 NOTE — Telephone Encounter (Signed)
Called pharmacy back to clarify metoprolol apparently patient brought rx order to pharmacy and Dr Aneta Mins forgot to put tartrate or succinate informed them its tartrate as it states in his chart

## 2015-01-03 NOTE — Telephone Encounter (Signed)
See message below so can he get scheduled or we have to wait for someone to help Dr Raymond Cook put the order for th ETT before he can get scheduled

## 2015-01-09 ENCOUNTER — Ambulatory Visit (INDEPENDENT_AMBULATORY_CARE_PROVIDER_SITE_OTHER): Payer: No Typology Code available for payment source | Admitting: Cardiovascular Disease

## 2015-01-09 DIAGNOSIS — I2510 Atherosclerotic heart disease of native coronary artery without angina pectoris: Secondary | ICD-10-CM

## 2015-01-09 NOTE — Procedures (Signed)
[facility]  Exercise Stress Test    Patient: Raymond Cook  Sex: male  DOB: 15-May-1966  MRN:  [id number]     Test Date:  01/09/2015     Interpretation Date: 01/09/2015    Referring Physician: MAFI  Rendering Physician: Maceo Pro, MD, [facility]    INDICATION:   1. Coronary artery disease involving native coronary artery of native heart without angina pectoris          GRADED EXERCISE STRESS    Blood Pressure: Rest = 110/80 Maximum = 155/75  Heart Rate:    Rest = 66 Maximum = 146    Exercise Time: 10 minutes, 56 seconds.     METS achieved: 12.8.  Percentage of max heart rate achieved: 85 %.  Test stopped due to: Target heart rate attained    Patient Symptoms: None  Blood pressure response: Normal  Arrhythmias: Premature Ventricular Contractions     Resting ECG: Normal sinus rhythm, no significant ST changes.  Stress ECG: Normal ECG stress test with no ischemic ECG changes.      CONCLUSION / RECOMMENDATION:    1. This is a normal symptom limited maximal exercise treadmill test with no evidence of ischemia by symptoms and by EKG criteria  2. The patient demonstrated a above average functional capacity for their age and gender.   3. Patient will be notified that their study was unremarkable, no further cardiac work-up is warranted at this time.      Signed by: Maceo Pro, MD, [facility]  [facility]

## 2015-01-11 NOTE — Telephone Encounter (Signed)
Taken care of

## 2015-01-15 ENCOUNTER — Ambulatory Visit (INDEPENDENT_AMBULATORY_CARE_PROVIDER_SITE_OTHER): Payer: No Typology Code available for payment source | Admitting: Cardiovascular Disease

## 2015-01-15 ENCOUNTER — Encounter (INDEPENDENT_AMBULATORY_CARE_PROVIDER_SITE_OTHER): Payer: Self-pay | Admitting: Cardiovascular Disease

## 2015-01-15 VITALS — BP 118/88 | HR 59 | Ht 70.0 in | Wt 185.0 lb

## 2015-01-15 DIAGNOSIS — R0602 Shortness of breath: Secondary | ICD-10-CM | POA: Insufficient documentation

## 2015-01-15 DIAGNOSIS — Z955 Presence of coronary angioplasty implant and graft: Secondary | ICD-10-CM | POA: Insufficient documentation

## 2015-01-15 DIAGNOSIS — I2510 Atherosclerotic heart disease of native coronary artery without angina pectoris: Secondary | ICD-10-CM

## 2015-01-15 NOTE — Progress Notes (Signed)
[facility]   Cardiology    Patient Name:  Raymond Cook  Date: 01/15/2015  Patient Number:  [id number]  DOB:  08/01/1966  Gender: male    Chief Complaint   Patient presents with   . Follow-up     to here discuss recent stress test for cardiac rehab.   . Coronary Artery Disease     has some SOB on exertion, not getting worse.       Assessment and Plan  Did well on stress test and is no Sx   Could proceed with cardiac rehab  Continue current care   Patient Active Problem List    Diagnosis Date Noted   . SOB (shortness of breath) on exertion    . Stented coronary artery    . NSTEMI (non-ST elevated myocardial infarction)    . Coronary artery disease      DES to LAD,occ small Diag,40-50% OM,40% PDA,EF 55% with apical hypok,cath 12/12/2014  CAD Therapeutic goals discussed with patient and provided information     . Hyperlipidemia  Lipid profile and guideline goals with therapeutic and life style changes were discussed        No orders of the defined types were placed in this encounter.       History of Present Illness   F/u stress test results  Good exc capacity  No CP,plapit   Has mild SOB running    No Known Allergies    Past Medical History   Diagnosis Date   . NSTEMI (non-ST elevated myocardial infarction)    . Coronary artery disease    . Hyperlipidemia    . SOB (shortness of breath) on exertion    . Stented coronary artery        Past Surgical History   Procedure Laterality Date   . Coronary angioplasty         Family History   Problem Relation Age of Onset   . Breast cancer Mother    . Diabetes Father      type I   . Leukemia Father        No images are attached to the encounter.    Medications:   Current Outpatient Prescriptions   Medication Sig Dispense Refill   . aspirin EC 81 MG EC tablet Take 81 mg by mouth daily.     Marland Kitchen atorvastatin (LIPITOR) 80 MG tablet Take 80 mg by mouth daily.     Marland Kitchen esomeprazole (NEXIUM) 20 MG capsule Take 20 mg by mouth every morning before breakfast.     . lisinopril  (PRINIVIL,ZESTRIL) 10 MG tablet Take 10 mg by mouth daily.     . metoprolol (LOPRESSOR) 25 MG tablet Take 25 mg by mouth 2 (two) times daily.     . ticagrelor (BRILINTA) 90 MG Tab Take 90 mg by mouth every 12 (twelve) hours.       No current facility-administered medications for this visit.       Physical Exam:     Filed Vitals:    01/15/15 1055   BP: 118/88   Pulse: 59   Height: 1.778 m (5\' 10" )   Weight: 83.915 kg (185 lb)   SpO2: 100%   Body surface area is 2.04 meters squared.  Body mass index is 26.54 kg/(m^2).    Constitutional: well developed, nourished, no distress and alert and oriented x 3   HENT: atraumatic, nose normal, normocephalic, left exterior ear normal, right external ear normal and oropharynx clear and moist  Eyes: conjunctiva normal, EOM normal and PERRL   Neck: ROM normal, supple and trachea normal   Vascular WNL, no edema, normal pulses , no cellulitis   Cardiovascular: heart sounds normal, intact distal pulses, normal rate, regular rhythm and no murmur   Pulmonary/Chest Wall: breath sounds normal, effort normal and no rales   Abdominal: appearance normal, bowel sounds normal, soft and non-tender   Musculoskeletal: normal ROM, normal tone and strength   Neurological: awake, alert and oriented x 3 and gait normal, cranial nerves intact, motor and sensory normal   Skin: dry, intact and warm     LABS:  Lipid Panel No results found for: CHOL, TRIG, HDL  CBC No results found for: WBC, HGB, HCT, MCV, PLT   BMPNo results found for: GLUCOSE, SODIUM, POTASSIUM, CHLORIDE, CO2, BUN  INR No results found for: INR, PROTIME    REVIEW OF SYSTEMS: All other systems reviewed and negative except as stated above.    Electronically signed by: Miachel Roux, MD 01/15/2015 11:46 AM

## 2015-01-16 ENCOUNTER — Ambulatory Visit (INDEPENDENT_AMBULATORY_CARE_PROVIDER_SITE_OTHER): Payer: Medicare Other | Admitting: Internal Medicine

## 2015-01-16 ENCOUNTER — Encounter: Payer: Self-pay | Admitting: Internal Medicine

## 2015-01-16 VITALS — BP 112/66 | HR 73 | Ht 75.0 in | Wt 227.6 lb

## 2015-01-16 DIAGNOSIS — E785 Hyperlipidemia, unspecified: Secondary | ICD-10-CM | POA: Insufficient documentation

## 2015-01-16 DIAGNOSIS — F0390 Unspecified dementia without behavioral disturbance: Secondary | ICD-10-CM | POA: Diagnosis not present

## 2015-01-16 DIAGNOSIS — I4901 Ventricular fibrillation: Secondary | ICD-10-CM

## 2015-01-16 DIAGNOSIS — Z95810 Presence of automatic (implantable) cardiac defibrillator: Secondary | ICD-10-CM

## 2015-01-16 LAB — MDC_IDC_ENUM_SESS_TYPE_INCLINIC
Battery Remaining Longevity: 73.2 mo
Brady Statistic RV Percent Paced: 0 %
Date Time Interrogation Session: 20160120122343
HighPow Impedance: 76.5 Ohm
Implantable Pulse Generator Serial Number: 618386
Lead Channel Impedance Value: 387.5 Ohm
Lead Channel Pacing Threshold Amplitude: 0.75 V
Lead Channel Pacing Threshold Pulse Width: 0.5 ms
Lead Channel Sensing Intrinsic Amplitude: 11.3 mV
Lead Channel Setting Pacing Amplitude: 2.5 V
Lead Channel Setting Pacing Pulse Width: 0.5 ms
Lead Channel Setting Sensing Sensitivity: 0.5 mV
Zone Setting Detection Interval: 270 ms
Zone Setting Detection Interval: 340 ms

## 2015-01-16 NOTE — Patient Instructions (Signed)
Your physician wants you to follow-up in:6 months wit h Dr Excell Seltzerooper and 12 months with Dr Jacquiline DoeAllred You will receive a reminder letter in the mail two months in advance. If you don't receive a letter, please call our office to schedule the follow-up appointment.  Remote monitoring is used to monitor your Pacemaker or ICD from home. This monitoring reduces the number of office visits required to check your device to one time per year. It allows us to keep an eye on the functioning of your device to ensure it is working properly. You are scheduled for a device check from home on 04/17/2015 You may send your transmission at any time that day. If you have a wireless device, the transmission will be sent automatically. After your physician reviews your transmission, you will receive a postcard with your next transmission date.

## 2015-01-16 NOTE — Progress Notes (Signed)
PCP: Dr Maudie Mercury Primary Cardiologist:  Dr Jeneen Rinks is a 49 y.o. male who presents today for routine electrophysiology followup.  He has advanced dementia.  He goes to daycare 3 days per week.  His mother thinks that his memory is " a little worse" than last year. Today, he denies symptoms of palpitations, chest pain,  lower extremity edema, dizziness, presyncope, syncope, or ICD shocks.  The patient is otherwise without complaint today.   Past Medical History  Diagnosis Date  . CAD (coronary artery disease)     nonobstructive  . VF (ventricular fibrillation)     arrest  . 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 (doesn't say where LV goes to)   . 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 Anxiety   . Depression   . Automatic implantable cardiac defibrillator    Past Surgical History  Procedure Laterality Date  . Icd implantation      with defibrillation threshold testing. [employer] Jude    Current Outpatient Prescriptions  Medication Sig Dispense Refill  . aspirin 325 MG EC tablet Take 325 mg by mouth daily.    . Cholecalciferol (VITAMIN D-3) 5000 UNITS TABS Take 5,000 Units by mouth daily.     . citalopram (CELEXA) 20 MG tablet Take 1 tablet (20 mg total) by mouth daily.    . Cyanocobalamin 1000 MCG/ML KIT Inject 1,000 mcg as directed every 30 (thirty) days.    . Divalproex Sodium (DEPAKOTE PO) Take 125 mg by mouth daily.     . folic acid (FOLVITE) 1 MG tablet Take 1 tablet (1 mg total) by mouth daily.    Marland Kitchen LACTASE PO Take one tablespoon by mouth daily as needed for supplemental value    . levothyroxine (SYNTHROID, LEVOTHROID) 25 MCG tablet Take 1 tablet (25 mcg total) by mouth daily before breakfast. 90 tablet 0  . LORazepam (ATIVAN) 0.5 MG tablet Take 0.5 mg by mouth daily.     Marland Kitchen omeprazole (PRILOSEC) 40 MG capsule Take 40 mg by mouth daily.    . simvastatin (ZOCOR) 5 MG tablet Take 1 tablet by mouth daily.    Marland Kitchen terazosin (HYTRIN) 1 MG capsule Take 1  mg by mouth daily.     Marland Kitchen thiamine 100 MG tablet Take 1 tablet (100 mg total) by mouth daily.    . [DISCONTINUED] isosorbide mononitrate (IMDUR) 30 MG 24 hr tablet Take 15 mg by mouth at bedtime.       No current facility-administered medications for this visit.    Physical Exam: Filed Vitals:   01/16/15 1141  BP: 112/66  Pulse: 73  Height: 6' 3" (1.905 m)  Weight: 227 lb 9.6 oz (103.239 kg)    GEN- The patient is well appearing, alert but not oriented Head- normocephalic, atraumatic Eyes-  Sclera clear, conjunctiva pink Ears- hearing intact Oropharynx- clear Lungs- Clear to ausculation bilaterally, normal work of breathing Chest- ICD pocket is well healed Heart- Regular rate and rhythm, no murmurs, rubs or gallops, PMI not laterally displaced GI- soft, NT, ND, + BS Extremities- no clubbing, cyanosis, or edema  ICD interrogation- reviewed in detail today,  See PACEART report  Assessment and Plan:  1.  Coronary artery spasm/ prior VF arrest No chest pain recently Normal ICD function See Pace Art report No changes today  2. HL Per Dr Maudie Mercury, recently initiated on simvastatin  [facility] Follow-up with Dr Burt Knack in 6 months I will see  in a year

## 2015-02-05 DIAGNOSIS — R413 Other amnesia: Secondary | ICD-10-CM | POA: Diagnosis not present

## 2015-02-19 DIAGNOSIS — R413 Other amnesia: Secondary | ICD-10-CM | POA: Diagnosis not present

## 2015-02-20 DIAGNOSIS — I2510 Atherosclerotic heart disease of native coronary artery without angina pectoris: Secondary | ICD-10-CM | POA: Diagnosis not present

## 2015-02-20 DIAGNOSIS — E039 Hypothyroidism, unspecified: Secondary | ICD-10-CM | POA: Diagnosis not present

## 2015-03-06 ENCOUNTER — Encounter: Payer: Self-pay | Admitting: Internal Medicine

## 2015-03-06 DIAGNOSIS — I2510 Atherosclerotic heart disease of native coronary artery without angina pectoris: Secondary | ICD-10-CM | POA: Diagnosis not present

## 2015-03-06 DIAGNOSIS — F04 Amnestic disorder due to known physiological condition: Secondary | ICD-10-CM | POA: Diagnosis not present

## 2015-03-06 DIAGNOSIS — E559 Vitamin D deficiency, unspecified: Secondary | ICD-10-CM | POA: Diagnosis not present

## 2015-03-06 DIAGNOSIS — E039 Hypothyroidism, unspecified: Secondary | ICD-10-CM | POA: Diagnosis not present

## 2015-03-14 ENCOUNTER — Telehealth: Payer: Self-pay | Admitting: *Deleted

## 2015-03-14 NOTE — Telephone Encounter (Signed)
Labs received from [facility]. Collected 02/20/15.  CBC w/ Diff: - Hemoglobin:14.875.61  CMP: Creatinine: 1.1 EGFR: 75.61  Lipid Panel: Calculated LDL: 99  TSH: 1.890 Vitamin B12: 424 PSA: 0.79

## 2015-04-12 ENCOUNTER — Telehealth: Payer: Self-pay | Admitting: Internal Medicine

## 2015-04-12 NOTE — Telephone Encounter (Signed)
New Message  Pt mother wants to speak w/ device about pt's remote device appt. Please call and discuss.

## 2015-04-12 NOTE — Telephone Encounter (Signed)
Informed pt mother that transmission is automatic and it will send transmission while he sleeps. I did inform her that if the transmission was not received by 12 Noon on 04-17-15 I would call and ask that pt send manual transmission. I explained to pt mother how to send the transmission. She verbalized understanding.

## 2015-04-17 ENCOUNTER — Ambulatory Visit (INDEPENDENT_AMBULATORY_CARE_PROVIDER_SITE_OTHER): Payer: Medicare Other | Admitting: *Deleted

## 2015-04-17 DIAGNOSIS — I4901 Ventricular fibrillation: Secondary | ICD-10-CM | POA: Diagnosis not present

## 2015-04-17 LAB — MDC_IDC_ENUM_SESS_TYPE_REMOTE
Battery Remaining Longevity: 71 mo
Battery Remaining Percentage: 61 %
Battery Voltage: 2.96 V
Brady Statistic RV Percent Paced: 1 %
Date Time Interrogation Session: 20160420082058
HighPow Impedance: 79 Ohm
HighPow Impedance: 79 Ohm
Implantable Pulse Generator Serial Number: 618386
Lead Channel Impedance Value: 400 Ohm
Lead Channel Pacing Threshold Amplitude: 0.75 V
Lead Channel Pacing Threshold Pulse Width: 0.5 ms
Lead Channel Sensing Intrinsic Amplitude: 10 mV
Lead Channel Setting Pacing Amplitude: 2.5 V
Lead Channel Setting Pacing Pulse Width: 0.5 ms
Lead Channel Setting Sensing Sensitivity: 0.5 mV
Zone Setting Detection Interval: 270 ms
Zone Setting Detection Interval: 340 ms

## 2015-04-17 NOTE — Progress Notes (Signed)
Remote ICD transmission.   

## 2015-04-23 ENCOUNTER — Encounter (INDEPENDENT_AMBULATORY_CARE_PROVIDER_SITE_OTHER): Payer: Self-pay | Admitting: Cardiovascular Disease

## 2015-04-23 ENCOUNTER — Ambulatory Visit (INDEPENDENT_AMBULATORY_CARE_PROVIDER_SITE_OTHER): Payer: No Typology Code available for payment source | Admitting: Cardiovascular Disease

## 2015-04-23 VITALS — BP 131/72 | HR 53 | Ht 70.0 in | Wt 186.6 lb

## 2015-04-23 DIAGNOSIS — I214 Non-ST elevation (NSTEMI) myocardial infarction: Secondary | ICD-10-CM

## 2015-04-23 DIAGNOSIS — E7800 Pure hypercholesterolemia, unspecified: Secondary | ICD-10-CM

## 2015-04-23 DIAGNOSIS — Z955 Presence of coronary angioplasty implant and graft: Secondary | ICD-10-CM

## 2015-04-23 DIAGNOSIS — I2510 Atherosclerotic heart disease of native coronary artery without angina pectoris: Secondary | ICD-10-CM

## 2015-04-23 NOTE — Progress Notes (Signed)
[facility]   Cardiology    Patient Name:  Raymond Cook  Date: 04/23/2015  Patient Number:  [id number]  DOB:  1966-12-16  Gender: male    Chief Complaint   Patient presents with   . Follow-up     3 month visit.    . Coronary Artery Disease     started cardiac rehab in Jan 2016.       Assessment and Plan  cardiac wise stable   Want to mow the lawn and go hiking ,does 8-10 METS ar cardiac rehab without issues  Echo to eval EF post MI  Check lipis  Encounter Diagnoses   Name Primary?   . NSTEMI (non-ST elevated myocardial infarction) Yes   . Coronary artery disease involving native coronary artery of native heart without angina pectoris  CAD Therapeutic goals discussed with patient and provided information      . Pure hypercholesterolemia  Lipid profile and guideline goals with therapeutic and life style changes were discussed    . Stented coronary artery         Orders Placed This Encounter   Procedures   . Hemoglobin A1C   . Basic Metabolic Panel     Non-fasting     Standing Status: Future      Number of Occurrences:       Standing Expiration Date: 04/23/2016     Order Specific Question:  Has the patient fasted?     Answer:  No   . Hepatic function panel (LFT)     Standing Status: Future      Number of Occurrences:       Standing Expiration Date: 04/23/2016   . Lipid panel     Fasting     Standing Status: Future      Number of Occurrences:       Standing Expiration Date: 04/23/2016     Order Specific Question:  Has the patient fasted?     Answer:  No   . Echocardiogram Adult Complete W Clr/ Dopp Waveform     Order Specific Question:  Which group should read the exam?     Answer:  N/A       History of Present Illness   F/u CAD,HTN,HLP  All stable on meds   Has mild DOE ,thinks it could be from Brilinta ,exc at cardiac rehab without any prob  Compliant with meds    No Known Allergies    Past Medical History   Diagnosis Date   . NSTEMI (non-ST elevated myocardial infarction)    . Coronary artery disease    .  Hyperlipidemia    . SOB (shortness of breath) on exertion    . Stented coronary artery        Past Surgical History   Procedure Laterality Date   . Coronary angioplasty         Family History   Problem Relation Age of Onset   . Breast cancer Mother    . Diabetes Father      type I   . Leukemia Father        No images are attached to the encounter.    Medications:   Current Outpatient Prescriptions   Medication Sig Dispense Refill   . aspirin EC 81 MG EC tablet Take 81 mg by mouth daily.     Marland Kitchen atorvastatin (LIPITOR) 80 MG tablet Take 80 mg by mouth daily.     Marland Kitchen esomeprazole (NEXIUM) 20 MG capsule Take 20 mg  by mouth every other day.        . lisinopril (PRINIVIL,ZESTRIL) 10 MG tablet Take 10 mg by mouth daily.     . metoprolol (LOPRESSOR) 25 MG tablet Take 25 mg by mouth 2 (two) times daily.     . Omega-3 Fatty Acids (OMEGA-3 FISH OIL) 500 MG Cap Take by mouth 2 (two) times daily.     . ticagrelor (BRILINTA) 90 MG Tab Take 90 mg by mouth every 12 (twelve) hours.       No current facility-administered medications for this visit.       Physical Exam:     Filed Vitals:    04/23/15 1329   BP: 131/72   Pulse: 53   Height: 1.778 m (5\' 10" )   Weight: 84.641 kg (186 lb 9.6 oz)   SpO2: 100%   Body surface area is 2.04 meters squared.  Body mass index is 26.77 kg/(m^2).    Constitutional: well developed, nourished, no distress and alert and oriented x 3   HENT: atraumatic, nose normal, normocephalic, left exterior ear normal, right external ear normal and oropharynx clear and moist   Eyes: conjunctiva normal, EOM normal and PERRL   Neck: ROM normal, supple and trachea normal   Vascular WNL, no edema, normal pulses , no cellulitis   Cardiovascular: heart sounds normal, intact distal pulses, normal rate, regular rhythm and no murmur   Pulmonary/Chest Wall: breath sounds normal, effort normal and no rales   Abdominal: appearance normal, bowel sounds normal, soft and non-tender   Musculoskeletal: normal ROM, normal tone and  strength   Neurological: awake, alert and oriented x 3 and gait normal, cranial nerves intact, motor and sensory normal   Skin: dry, intact and warm     LABS:  Lipid Panel No results found for: CHOL, TRIG, HDL  CBC No results found for: WBC, HGB, HCT, MCV, PLT   BMPNo results found for: GLUCOSE, SODIUM, POTASSIUM, CHLORIDE, CO2, BUN, CREATININE  INR No results found for: INR, PROTIME    REVIEW OF SYSTEMS: All other systems reviewed and negative except as stated above.    Electronically signed by: Miachel Roux, MD 04/23/2015 1:58 PM

## 2015-04-25 ENCOUNTER — Telehealth (INDEPENDENT_AMBULATORY_CARE_PROVIDER_SITE_OTHER): Payer: Self-pay | Admitting: Cardiovascular Disease

## 2015-04-25 ENCOUNTER — Other Ambulatory Visit (INDEPENDENT_AMBULATORY_CARE_PROVIDER_SITE_OTHER): Payer: Self-pay

## 2015-04-25 DIAGNOSIS — I2510 Atherosclerotic heart disease of native coronary artery without angina pectoris: Secondary | ICD-10-CM

## 2015-04-25 MED ORDER — NITROGLYCERIN 0.4 MG SL SUBL
0.4000 mg | SUBLINGUAL_TABLET | SUBLINGUAL | Status: AC | PRN
Start: 2015-04-25 — End: 2015-05-25

## 2015-04-25 NOTE — Telephone Encounter (Signed)
I called pt at number listed and asked how much nitro is he taking and if it is the patch for the SL tablet? Because it is not listed in his chart.      Thanks,  Serina Cowper

## 2015-04-25 NOTE — Telephone Encounter (Signed)
approved

## 2015-04-25 NOTE — Telephone Encounter (Signed)
Patient states you would give him rx for nitrostat would u like to do nitrostat 0.4 sublingual prn if so please approve my rx thanks

## 2015-04-25 NOTE — Telephone Encounter (Signed)
Pt called back to say he never had a prescription for this. Dr. Aneta Mins said he would give him one and when he checked out and was asking about it he was told that medications go electronically to the pharmacy.    Thanks,  cw

## 2015-04-25 NOTE — Telephone Encounter (Signed)
Please call Nitro refill to [facility] in [place]. Last seen 04/23/15.    Thanks,  cw

## 2015-04-26 NOTE — Telephone Encounter (Signed)
Called patient this morning to let him know that his nitrostat was sent to the pharmacy yesterday he was not there so relayed message to wife

## 2015-04-30 ENCOUNTER — Encounter: Payer: Self-pay | Admitting: Cardiology

## 2015-05-06 ENCOUNTER — Encounter: Payer: Self-pay | Admitting: Internal Medicine

## 2015-05-22 ENCOUNTER — Telehealth: Payer: Self-pay | Admitting: Cardiovascular Disease
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
## 2015-05-23 ENCOUNTER — Ambulatory Visit: Payer: Medicare Other | Admitting: Neurology

## 2015-05-24 ENCOUNTER — Ambulatory Visit (INDEPENDENT_AMBULATORY_CARE_PROVIDER_SITE_OTHER): Payer: No Typology Code available for payment source | Admitting: Cardiovascular Disease

## 2015-05-24 ENCOUNTER — Telehealth (INDEPENDENT_AMBULATORY_CARE_PROVIDER_SITE_OTHER): Payer: Self-pay | Admitting: Cardiovascular Disease

## 2015-05-24 ENCOUNTER — Encounter (INDEPENDENT_AMBULATORY_CARE_PROVIDER_SITE_OTHER): Payer: Self-pay | Admitting: Cardiovascular Disease

## 2015-05-24 DIAGNOSIS — I2510 Atherosclerotic heart disease of native coronary artery without angina pectoris: Secondary | ICD-10-CM

## 2015-05-24 NOTE — Telephone Encounter (Signed)
Pt had an echo done today. His wife called to say they were under the impression that he was supposed to also have a stress test. I did not see an order for that and told her so, but she would like a call from Dr. Aneta Mins to confirm that. Ph # [phone].    Thanks,  cw

## 2015-05-24 NOTE — Telephone Encounter (Signed)
Tried calling patient back multiple times and number is out of service. I reviewed Dr. Wray Kearns not and he wanted just an echo with labs. No stress test at this time. Thank you

## 2015-05-25 NOTE — Procedures (Signed)
Echo in ultralinq

## 2015-05-28 ENCOUNTER — Ambulatory Visit (INDEPENDENT_AMBULATORY_CARE_PROVIDER_SITE_OTHER): Payer: No Typology Code available for payment source | Admitting: Cardiovascular Disease

## 2015-05-30 DIAGNOSIS — F04 Amnestic disorder due to known physiological condition: Secondary | ICD-10-CM | POA: Diagnosis not present

## 2015-05-30 DIAGNOSIS — I2510 Atherosclerotic heart disease of native coronary artery without angina pectoris: Secondary | ICD-10-CM | POA: Diagnosis not present

## 2015-05-30 DIAGNOSIS — E559 Vitamin D deficiency, unspecified: Secondary | ICD-10-CM | POA: Diagnosis not present

## 2015-05-30 DIAGNOSIS — Z79899 Other long term (current) drug therapy: Secondary | ICD-10-CM | POA: Diagnosis not present

## 2015-05-30 DIAGNOSIS — E531 Pyridoxine deficiency: Secondary | ICD-10-CM | POA: Diagnosis not present

## 2015-05-30 DIAGNOSIS — E039 Hypothyroidism, unspecified: Secondary | ICD-10-CM | POA: Diagnosis not present

## 2015-06-04 ENCOUNTER — Encounter (INDEPENDENT_AMBULATORY_CARE_PROVIDER_SITE_OTHER): Payer: Self-pay | Admitting: Cardiovascular Disease

## 2015-06-04 ENCOUNTER — Ambulatory Visit (INDEPENDENT_AMBULATORY_CARE_PROVIDER_SITE_OTHER): Payer: No Typology Code available for payment source | Admitting: Cardiovascular Disease

## 2015-06-04 VITALS — BP 121/78 | HR 48 | Ht 70.0 in | Wt 184.0 lb

## 2015-06-04 DIAGNOSIS — R0602 Shortness of breath: Secondary | ICD-10-CM

## 2015-06-04 DIAGNOSIS — Z955 Presence of coronary angioplasty implant and graft: Secondary | ICD-10-CM

## 2015-06-04 DIAGNOSIS — E782 Mixed hyperlipidemia: Secondary | ICD-10-CM

## 2015-06-04 DIAGNOSIS — I2510 Atherosclerotic heart disease of native coronary artery without angina pectoris: Secondary | ICD-10-CM

## 2015-06-04 DIAGNOSIS — I214 Non-ST elevation (NSTEMI) myocardial infarction: Secondary | ICD-10-CM

## 2015-06-04 NOTE — Progress Notes (Signed)
[facility]   Cardiology    Patient Name:  Raymond Cook  Date: 06/04/2015  Patient Number:  [id number]  DOB:  1966/03/31  Gender: male    Chief Complaint   Patient presents with   . Coronary Artery Disease     here to discuss results of recent echo.       Assessment and Plan  Echo EF 55%  Labs not done  Doing well cardiac wise ,finished cardiac rehab  Continue current care   Encounter Diagnoses   Name Primary?   . Coronary artery disease involving native coronary artery of native heart without angina pectoris  CAD Therapeutic goals discussed with patient and provided information   Yes   . NSTEMI (non-ST elevated myocardial infarction)    . Mixed hyperlipidemia  Lipid profile and guideline goals with therapeutic and life style changes were discussed    . SOB (shortness of breath) on exertion  resolved    . Stented coronary artery         No orders of the defined types were placed in this encounter.       History of Present Illness   F/u CAD,HLP  Labs not done   Stress test did more than 12 METS without Sx  Echo EF 55%  CAD stable on meds,without Sx,no CP/SOB    No Known Allergies    Past Medical History   Diagnosis Date   . NSTEMI (non-ST elevated myocardial infarction)    . Coronary artery disease    . Hyperlipidemia    . SOB (shortness of breath) on exertion    . Stented coronary artery        Past Surgical History   Procedure Laterality Date   . Coronary angioplasty         Family History   Problem Relation Age of Onset   . Breast cancer Mother    . Diabetes Father      type I   . Leukemia Father        No images are attached to the encounter.    Medications:   Current Outpatient Prescriptions   Medication Sig Dispense Refill   . aspirin EC 81 MG EC tablet Take 81 mg by mouth daily.     Marland Kitchen atorvastatin (LIPITOR) 80 MG tablet Take 80 mg by mouth daily.     Marland Kitchen esomeprazole (NEXIUM) 20 MG capsule Take 20 mg by mouth every other day.        . lisinopril (PRINIVIL,ZESTRIL) 10 MG tablet Take 10 mg by mouth daily.      . metoprolol (LOPRESSOR) 25 MG tablet Take 25 mg by mouth 2 (two) times daily.     . Omega-3 Fatty Acids (OMEGA-3 FISH OIL) 500 MG Cap Take by mouth 2 (two) times daily.     . ticagrelor (BRILINTA) 90 MG Tab Take 90 mg by mouth every 12 (twelve) hours.       No current facility-administered medications for this visit.       Physical Exam:     Filed Vitals:    06/04/15 1241   BP: 121/78   Pulse: 48   Height: 1.778 m (5\' 10" )   Weight: 83.462 kg (184 lb)   SpO2: 100%   Body surface area is 2.03 meters squared.  Body mass index is 26.4 kg/(m^2).    Constitutional: well developed, nourished, no distress and alert and oriented x 3   HENT: atraumatic, nose normal, normocephalic, left exterior ear normal, right  external ear normal and oropharynx clear and moist   Eyes: conjunctiva normal, EOM normal and PERRL   Neck: ROM normal, supple and trachea normal   Vascular WNL, no edema, normal pulses , no cellulitis   Cardiovascular: heart sounds normal, intact distal pulses, normal rate, regular rhythm and no murmur   Pulmonary/Chest Wall: breath sounds normal, effort normal and no rales   Abdominal: appearance normal, bowel sounds normal, soft and non-tender   Musculoskeletal: normal ROM, normal tone and strength   Neurological: awake, alert and oriented x 3 and gait normal, cranial nerves intact, motor and sensory normal   Skin: dry, intact and warm     LABS:  Lipid Panel No results found for: CHOL, TRIG, HDL  CBC No results found for: WBC, HGB, HCT, MCV, PLT   BMPNo results found for: GLUCOSE, SODIUM, POTASSIUM, CHLORIDE, CO2, BUN, CREATININE  INR No results found for: INR, PROTIME    REVIEW OF SYSTEMS: All other systems reviewed and negative except as stated above.    Electronically signed by: Miachel Roux, MD 06/04/2015 1:08 PM

## 2015-06-06 ENCOUNTER — Ambulatory Visit: Payer: Medicare Other | Admitting: Neurology

## 2015-06-06 DIAGNOSIS — E559 Vitamin D deficiency, unspecified: Secondary | ICD-10-CM | POA: Diagnosis not present

## 2015-06-06 DIAGNOSIS — E039 Hypothyroidism, unspecified: Secondary | ICD-10-CM | POA: Diagnosis not present

## 2015-06-06 DIAGNOSIS — Z125 Encounter for screening for malignant neoplasm of prostate: Secondary | ICD-10-CM | POA: Diagnosis not present

## 2015-06-06 DIAGNOSIS — I2510 Atherosclerotic heart disease of native coronary artery without angina pectoris: Secondary | ICD-10-CM | POA: Diagnosis not present

## 2015-06-13 DIAGNOSIS — F329 Major depressive disorder, single episode, unspecified: Secondary | ICD-10-CM | POA: Diagnosis not present

## 2015-06-24 ENCOUNTER — Ambulatory Visit (INDEPENDENT_AMBULATORY_CARE_PROVIDER_SITE_OTHER): Payer: Medicare Other | Admitting: Cardiovascular Disease

## 2015-06-24 ENCOUNTER — Encounter: Payer: Self-pay | Admitting: Cardiovascular Disease

## 2015-06-24 VITALS — BP 102/76 | HR 58 | Ht 75.0 in | Wt 226.1 lb

## 2015-06-24 DIAGNOSIS — E785 Hyperlipidemia, unspecified: Secondary | ICD-10-CM

## 2015-06-24 DIAGNOSIS — Z95810 Presence of automatic (implantable) cardiac defibrillator: Secondary | ICD-10-CM | POA: Diagnosis not present

## 2015-06-24 MED ORDER — SIMVASTATIN 5 MG PO TABS: 5.0000 mg | ORAL_TABLET | Freq: Every day | ORAL | Status: DC
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
## 2015-06-24 NOTE — Patient Instructions (Addendum)
Medication Instructions:  Your physician recommends that you continue on your current medications as directed. Please refer to the Current Medication list given to you today.  Restart Simvastatin 5mg  take one by mouth daily.   Labwork: No new orders.   Testing/Procedures: No new orders.   Follow-Up: Your physician wants you to follow-up in: 1 YEAR with Dr Excell Seltzerooper.  You will receive a reminder letter in the mail two months in advance. If you don't receive a letter, please call our office to schedule the follow-up appointment.   Any Other Special Instructions Will Be Listed Below (If Applicable).  A new defibrillator transmission "box" will come to you through the mail.

## 2015-06-26 ENCOUNTER — Encounter: Payer: Self-pay | Admitting: Cardiovascular Disease

## 2015-07-10 ENCOUNTER — Encounter: Payer: Self-pay | Admitting: Neurology

## 2015-07-10 ENCOUNTER — Ambulatory Visit (INDEPENDENT_AMBULATORY_CARE_PROVIDER_SITE_OTHER): Payer: Medicare Other | Admitting: Neurology

## 2015-07-10 VITALS — BP 125/70 | HR 79 | Ht 75.0 in | Wt 227.0 lb

## 2015-07-10 DIAGNOSIS — F0390 Unspecified dementia without behavioral disturbance: Secondary | ICD-10-CM

## 2015-07-10 DIAGNOSIS — E538 Deficiency of other specified B group vitamins: Secondary | ICD-10-CM

## 2015-07-10 MED ORDER — CYANOCOBALAMIN 1000 MCG/ML IJ SOLN
1000.0000 ug | Freq: Once | INTRAMUSCULAR | Status: AC
  Administered 2015-07-10: 1000 ug via INTRAMUSCULAR

## 2015-07-10 MED ORDER — MEMANTINE HCL 10 MG PO TABS: 10.0000 mg | ORAL_TABLET | Freq: Two times a day (BID) | ORAL | Status: DC
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
## 2015-07-10 NOTE — Addendum Note (Signed)
Addended by: Lindell SparKIRKMAN, MICHELLE C on: 07/10/2015 12:07 PM   Modules accepted: Orders

## 2015-07-17 ENCOUNTER — Ambulatory Visit (INDEPENDENT_AMBULATORY_CARE_PROVIDER_SITE_OTHER): Payer: Medicare Other | Admitting: *Deleted

## 2015-07-17 DIAGNOSIS — I4901 Ventricular fibrillation: Secondary | ICD-10-CM

## 2015-07-17 NOTE — Progress Notes (Signed)
Remote ICD transmission.   

## 2015-07-26 ENCOUNTER — Other Ambulatory Visit (INDEPENDENT_AMBULATORY_CARE_PROVIDER_SITE_OTHER): Payer: Self-pay | Admitting: Cardiovascular Disease

## 2015-07-26 LAB — CUP PACEART REMOTE DEVICE CHECK
Battery Remaining Longevity: 68 mo
Battery Remaining Percentage: 58 %
Battery Voltage: 2.96 V
Brady Statistic RV Percent Paced: 1 %
Date Time Interrogation Session: 20160720064850
HighPow Impedance: 81 Ohm
HighPow Impedance: 81 Ohm
Lead Channel Impedance Value: 400 Ohm
Lead Channel Pacing Threshold Amplitude: 0.75 V
Lead Channel Pacing Threshold Pulse Width: 0.5 ms
Lead Channel Sensing Intrinsic Amplitude: 8.4 mV
Lead Channel Setting Pacing Amplitude: 2.5 V
Lead Channel Setting Pacing Pulse Width: 0.5 ms
Lead Channel Setting Sensing Sensitivity: 0.5 mV
Pulse Gen Serial Number: 618386
Zone Setting Detection Interval: 270 ms
Zone Setting Detection Interval: 340 ms

## 2015-07-27 LAB — LIPID PANEL
Cholesterol / HDL Ratio: 2.5 (calc) (ref 0.0–5.0)
Cholesterol: 151 mg/dL (ref 125–200)
HDL: 61 mg/dL (ref >=40)
LDL Calculated: 75 mg/dL (ref <130)
Non HDL Cholesterol (LDL and VLDL): 90 mg/dL
Triglycerides: 73 mg/dL (ref <150)

## 2015-07-27 LAB — BASIC METABOLIC PANEL
BUN: 10 MG/DL (ref 7–25)
CO2: 25 mmol/L (ref 19–30)
Calcium: 9.4 MG/DL (ref 8.6–10.3)
Chloride: 105 mmol/L (ref 98–110)
Creatinine: 1.04 mg/dL (ref 0.60–1.35)
EGFR African American: 97 mL/min/{1.73_m2} (ref >=60)
EGFR: 84 mL/min/{1.73_m2} (ref >=60)
Glucose: 84 MG/DL (ref 65–99)
Potassium: 4.4 mmol/L (ref 3.5–5.3)
Sodium: 140 mmol/L (ref 135–146)

## 2015-07-27 LAB — HEPATIC FUNCTION PANEL
ALT: 27 U/L (ref 9–46)
AST (SGOT): 19 U/L (ref 10–40)
Albumin/Globulin Ratio: 1.7 (ref 1.0–2.5)
Albumin: 4.4 G/DL (ref 3.6–5.1)
Alkaline Phosphatase: 60 U/L (ref 40–115)
Bilirubin Direct: 0.1 MG/DL (ref 0.0–0.2)
Bilirubin Indirect: 0.6 MG/DL (ref 0.2–1.2)
Bilirubin, Total: 0.7 MG/DL (ref 0.2–1.2)
Globulin: 2.6 G/DL (ref 1.9–3.7)
Protein, Total: 7 G/DL (ref 6.1–8.1)

## 2015-07-30 ENCOUNTER — Encounter (INDEPENDENT_AMBULATORY_CARE_PROVIDER_SITE_OTHER): Payer: Self-pay | Admitting: Cardiovascular Disease

## 2015-07-30 ENCOUNTER — Ambulatory Visit (INDEPENDENT_AMBULATORY_CARE_PROVIDER_SITE_OTHER): Payer: No Typology Code available for payment source | Admitting: Cardiovascular Disease

## 2015-07-30 VITALS — BP 116/76 | HR 57 | Ht 70.0 in | Wt 180.0 lb

## 2015-07-30 DIAGNOSIS — I2510 Atherosclerotic heart disease of native coronary artery without angina pectoris: Secondary | ICD-10-CM

## 2015-07-30 DIAGNOSIS — Z955 Presence of coronary angioplasty implant and graft: Secondary | ICD-10-CM

## 2015-07-30 DIAGNOSIS — R0602 Shortness of breath: Secondary | ICD-10-CM

## 2015-07-30 DIAGNOSIS — E782 Mixed hyperlipidemia: Secondary | ICD-10-CM

## 2015-07-30 DIAGNOSIS — I214 Non-ST elevation (NSTEMI) myocardial infarction: Secondary | ICD-10-CM

## 2015-07-30 NOTE — Progress Notes (Signed)
[facility]   Cardiology    Patient Name:  Raymond Cook  Date: 07/30/2015  Patient Number:  [id number]  DOB:  1966-06-17  Gender: male    Chief Complaint   Patient presents with   . Hyperlipidemia     Here to discuss recent lab results       Assessment and Plan  Cardiac wise stable on current meds   Continue current care   Decide to decrease Brilinta dose to 60 BID in Dec after a reg stress test  Encounter Diagnoses   Name Primary?   . NSTEMI (non-ST elevated myocardial infarction) Yes   . Coronary artery disease involving native coronary artery of native heart without angina pectoris  CAD Therapeutic goals discussed with patient and provided information      . Stented coronary artery    . SOB (shortness of breath) on exertion    . Mixed hyperlipidemia  Lipid profile and guideline goals with therapeutic and life style changes were discussed         Orders Placed This Encounter   Procedures   . Exercise Stress Test Tracing Only     Standing Status: Future      Number of Occurrences:       Standing Expiration Date: 07/30/2016       History of Present Illness   F/u CAD,HLP  All stable on current meds  LDL 75,HDL 61  Creat 1.04  No CP    No Known Allergies    Past Medical History   Diagnosis Date   . NSTEMI (non-ST elevated myocardial infarction)    . Coronary artery disease    . Hyperlipidemia    . SOB (shortness of breath) on exertion    . Stented coronary artery        Past Surgical History   Procedure Laterality Date   . Coronary angioplasty         Family History   Problem Relation Age of Onset   . Breast cancer Mother    . Diabetes Father      type I   . Leukemia Father        No images are attached to the encounter.    Medications:   Current Outpatient Prescriptions   Medication Sig Dispense Refill   . aspirin EC 81 MG EC tablet Take 81 mg by mouth daily.     Marland Kitchen atorvastatin (LIPITOR) 80 MG tablet Take 80 mg by mouth daily.     Marland Kitchen esomeprazole (NEXIUM) 20 MG capsule Take 20 mg by mouth every other day.        .  lisinopril (PRINIVIL,ZESTRIL) 10 MG tablet Take 10 mg by mouth daily.     . metoprolol (LOPRESSOR) 25 MG tablet Take 25 mg by mouth 2 (two) times daily.     . Omega-3 Fatty Acids (OMEGA-3 FISH OIL) 500 MG Cap Take by mouth 2 (two) times daily.     . ticagrelor (BRILINTA) 90 MG Tab Take 90 mg by mouth every 12 (twelve) hours.       No current facility-administered medications for this visit.       Physical Exam:     Filed Vitals:    07/30/15 1005   BP: 116/76   Pulse: 57   Height: 1.778 m (5\' 10" )   Weight: 81.647 kg (180 lb)   Body surface area is 2.01 meters squared.  Body mass index is 25.83 kg/(m^2).    Constitutional: well developed, nourished, no distress  and alert and oriented x 3   HENT: atraumatic, nose normal, normocephalic, left exterior ear normal, right external ear normal and oropharynx clear and moist   Eyes: conjunctiva normal, EOM normal and PERRL   Neck: ROM normal, supple and trachea normal   Vascular WNL, no edema, normal pulses , no cellulitis   Cardiovascular: heart sounds normal, intact distal pulses, normal rate, regular rhythm and no murmur   Pulmonary/Chest Wall: breath sounds normal, effort normal and no rales   Abdominal: appearance normal, bowel sounds normal, soft and non-tender   Musculoskeletal: normal ROM, normal tone and strength   Neurological: awake, alert and oriented x 3 and gait normal, cranial nerves intact, motor and sensory normal   Skin: dry, intact and warm     LABS:  Lipid Panel   CHOLESTEROL   Date/Time Value Ref Range Status   07/26/2015 09:43 AM 151 125 - 200 mg/dL Final     TRIGLYCERIDES   Date/Time Value Ref Range Status   07/26/2015 09:43 AM 73 <150 mg/dL Final     Comment:     Fasting reference interval     HDL   Date/Time Value Ref Range Status   07/26/2015 09:43 AM 61 > or = 40 mg/dL Final     CBC No results found for: WBC, HGB, HCT, MCV, PLT   BMP  Lab Results   Component Value Date    CO2 25 07/26/2015    BUN 10 07/26/2015     INR No results found for: INR,  PROTIME    REVIEW OF SYSTEMS: All other systems reviewed and negative except as stated above.    Electronically signed by: Miachel Roux, MD 07/30/2015 10:22 AM

## 2015-08-12 ENCOUNTER — Encounter: Payer: Self-pay | Admitting: Cardiology

## 2015-08-20 ENCOUNTER — Encounter: Payer: Self-pay | Admitting: Internal Medicine

## 2015-09-09 ENCOUNTER — Telehealth (INDEPENDENT_AMBULATORY_CARE_PROVIDER_SITE_OTHER): Payer: Self-pay | Admitting: Cardiovascular Disease

## 2015-09-09 NOTE — Telephone Encounter (Signed)
Pt needs to go to the dentist and needs a note from Dr. Aneta Mins to ok it. Ph # [phone]. Last seen 07/30/15.    Thanks,  cw

## 2015-09-12 ENCOUNTER — Encounter (INDEPENDENT_AMBULATORY_CARE_PROVIDER_SITE_OTHER): Payer: Self-pay | Admitting: Cardiovascular Disease

## 2015-09-12 NOTE — Telephone Encounter (Signed)
Letter for dentist saved in Epic and mailed to patient.

## 2015-09-13 ENCOUNTER — Telehealth (INDEPENDENT_AMBULATORY_CARE_PROVIDER_SITE_OTHER): Payer: Self-pay | Admitting: Cardiovascular Disease

## 2015-09-13 NOTE — Telephone Encounter (Signed)
Good morning,   I believe this would be a question that they have to ask their Primary Care Provider.    Thank you,  Cala Bradford

## 2015-09-13 NOTE — Telephone Encounter (Signed)
Pt's wife called to say that they just had a new grandchild and would like to know if he should have the T-DAP booster and the flu shot. Ph # [phone].    Thanks,  cw

## 2015-10-07 ENCOUNTER — Telehealth (INDEPENDENT_AMBULATORY_CARE_PROVIDER_SITE_OTHER): Payer: Self-pay | Admitting: Cardiovascular Disease

## 2015-10-07 NOTE — Telephone Encounter (Signed)
Called them back and lvm that they need to see there PCP if its a cold with fever or if they are having other cardiac sxs they can call back

## 2015-10-07 NOTE — Telephone Encounter (Signed)
Patients wife called and said her husband has had a cold for 1 week is starting to get fever over 102. She wanted to know if this was due to the Stent in his heart that was placed last December. I told her no I do not think so but would let dr Aneta Mins know. I advised her to contact her PCP to make an appointment.

## 2015-10-15 ENCOUNTER — Telehealth (INDEPENDENT_AMBULATORY_CARE_PROVIDER_SITE_OTHER): Payer: Self-pay | Admitting: Cardiovascular Disease

## 2015-10-15 NOTE — Telephone Encounter (Signed)
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 I put the paperwork back at the nurses' station in [place]. Patient would like a call back wanted to confirm that it's been faxed.

## 2015-10-16 NOTE — Telephone Encounter (Signed)
Form filled out and faxed with confirmation. Called patient and left VM informing it was done.

## 2015-10-17 ENCOUNTER — Encounter (INDEPENDENT_AMBULATORY_CARE_PROVIDER_SITE_OTHER): Payer: Self-pay

## 2015-10-21 ENCOUNTER — Encounter: Payer: Medicare Other | Admitting: *Deleted

## 2015-10-21 ENCOUNTER — Telehealth: Payer: Self-pay | Admitting: Cardiology

## 2015-10-21 NOTE — Telephone Encounter (Signed)
LMOVM reminding pt to send remote transmission.   

## 2015-10-26 DIAGNOSIS — Z23 Encounter for immunization: Secondary | ICD-10-CM | POA: Diagnosis not present

## 2015-11-29 ENCOUNTER — Telehealth: Payer: Self-pay | Admitting: Cardiology

## 2015-11-29 NOTE — Telephone Encounter (Signed)
Spoke w/ pt mother and requested that pt send a manual transmission w/  home monitor b/c we have not received one in at least 8 days. Pt mother verbalized understanding

## 2015-12-02 ENCOUNTER — Telehealth: Payer: Self-pay | Admitting: Internal Medicine

## 2015-12-02 NOTE — Telephone Encounter (Signed)
New message      Talk to someone about sending in a remote transmission.

## 2015-12-02 NOTE — Telephone Encounter (Signed)
Spoke w/ pt mom and she informed me that she attempted to send transmission but it was unsuccessful. Pt isn't near his monitor between 7 AM-6 PM, Instructed pt mom to call tech services tonight to receive help trouble shooting monitor. She verbalized understanding.

## 2015-12-03 DIAGNOSIS — F04 Amnestic disorder due to known physiological condition: Secondary | ICD-10-CM | POA: Diagnosis not present

## 2015-12-03 DIAGNOSIS — E559 Vitamin D deficiency, unspecified: Secondary | ICD-10-CM | POA: Diagnosis not present

## 2015-12-03 DIAGNOSIS — Z125 Encounter for screening for malignant neoplasm of prostate: Secondary | ICD-10-CM | POA: Diagnosis not present

## 2015-12-03 DIAGNOSIS — Z111 Encounter for screening for respiratory tuberculosis: Secondary | ICD-10-CM | POA: Diagnosis not present

## 2015-12-03 DIAGNOSIS — E039 Hypothyroidism, unspecified: Secondary | ICD-10-CM | POA: Diagnosis not present

## 2015-12-03 DIAGNOSIS — Z79899 Other long term (current) drug therapy: Secondary | ICD-10-CM | POA: Diagnosis not present

## 2015-12-03 DIAGNOSIS — I2510 Atherosclerotic heart disease of native coronary artery without angina pectoris: Secondary | ICD-10-CM | POA: Diagnosis not present

## 2015-12-05 DIAGNOSIS — I2510 Atherosclerotic heart disease of native coronary artery without angina pectoris: Secondary | ICD-10-CM | POA: Diagnosis not present

## 2015-12-05 DIAGNOSIS — E538 Deficiency of other specified B group vitamins: Secondary | ICD-10-CM | POA: Diagnosis not present

## 2015-12-05 DIAGNOSIS — E039 Hypothyroidism, unspecified: Secondary | ICD-10-CM | POA: Diagnosis not present

## 2015-12-05 DIAGNOSIS — E559 Vitamin D deficiency, unspecified: Secondary | ICD-10-CM | POA: Diagnosis not present

## 2015-12-05 DIAGNOSIS — F04 Amnestic disorder due to known physiological condition: Secondary | ICD-10-CM | POA: Diagnosis not present

## 2015-12-31 ENCOUNTER — Other Ambulatory Visit (INDEPENDENT_AMBULATORY_CARE_PROVIDER_SITE_OTHER): Payer: Self-pay | Admitting: Cardiovascular Disease

## 2015-12-31 ENCOUNTER — Telehealth (INDEPENDENT_AMBULATORY_CARE_PROVIDER_SITE_OTHER): Payer: Self-pay | Admitting: Cardiovascular Disease

## 2015-12-31 DIAGNOSIS — I10 Essential (primary) hypertension: Secondary | ICD-10-CM

## 2015-12-31 DIAGNOSIS — I2510 Atherosclerotic heart disease of native coronary artery without angina pectoris: Secondary | ICD-10-CM

## 2015-12-31 DIAGNOSIS — E782 Mixed hyperlipidemia: Secondary | ICD-10-CM

## 2015-12-31 DIAGNOSIS — Z955 Presence of coronary angioplasty implant and graft: Secondary | ICD-10-CM

## 2015-12-31 MED ORDER — ATORVASTATIN CALCIUM 80 MG PO TABS
80.0000 mg | ORAL_TABLET | Freq: Every day | ORAL | Status: AC
Start: 2015-12-31 — End: 2016-03-30

## 2015-12-31 MED ORDER — METOPROLOL TARTRATE 25 MG PO TABS
25.0000 mg | ORAL_TABLET | Freq: Two times a day (BID) | ORAL | Status: AC
Start: 2015-12-31 — End: 2016-03-30

## 2015-12-31 MED ORDER — LISINOPRIL 10 MG PO TABS
10.0000 mg | ORAL_TABLET | Freq: Every day | ORAL | Status: AC
Start: 2015-12-31 — End: 2016-03-30

## 2015-12-31 MED ORDER — TICAGRELOR 90 MG PO TABS
90.0000 mg | ORAL_TABLET | Freq: Two times a day (BID) | ORAL | Status: DC
Start: 2015-12-31 — End: 2016-01-14

## 2015-12-31 NOTE — Telephone Encounter (Signed)
Medication refill request sent to Dr. Aneta Mins for approval.

## 2015-12-31 NOTE — Telephone Encounter (Signed)
[facility] requesting Brilinta 90 MG. Ph # [phone] fax # [phone].    Thanks,  cw

## 2015-12-31 NOTE — Telephone Encounter (Signed)
[facility] requesting Metoprolol Tartrate 25 MG, Lisinopril 10 MG and Atorvastatin 80 MG. Ph # [phone] fax # [phone]. Last seen 07/30/15.    Thanks,  cw

## 2015-12-31 NOTE — Telephone Encounter (Signed)
[facility] requesting Brilinta 90 MG. Ph # [phone] fax # [phone]. Last seen 07/30/15.    Thanks,  cw

## 2015-12-31 NOTE — Telephone Encounter (Signed)
Medication refill sent to Dr. Mafi for approval

## 2016-01-09 ENCOUNTER — Encounter: Payer: Self-pay | Admitting: Nurse Practitioner

## 2016-01-09 ENCOUNTER — Ambulatory Visit (INDEPENDENT_AMBULATORY_CARE_PROVIDER_SITE_OTHER): Payer: Medicare Other | Admitting: Nurse Practitioner

## 2016-01-09 VITALS — Ht 75.0 in

## 2016-01-09 DIAGNOSIS — F0390 Unspecified dementia without behavioral disturbance: Secondary | ICD-10-CM | POA: Diagnosis not present

## 2016-01-09 DIAGNOSIS — R413 Other amnesia: Secondary | ICD-10-CM | POA: Diagnosis not present

## 2016-01-09 NOTE — Patient Instructions (Signed)
Memory score is stable F/U in 6 months 
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
## 2016-01-14 ENCOUNTER — Encounter (INDEPENDENT_AMBULATORY_CARE_PROVIDER_SITE_OTHER): Payer: Self-pay | Admitting: Cardiovascular Disease

## 2016-01-14 ENCOUNTER — Ambulatory Visit (INDEPENDENT_AMBULATORY_CARE_PROVIDER_SITE_OTHER): Payer: No Typology Code available for payment source | Admitting: Cardiovascular Disease

## 2016-01-14 VITALS — BP 116/77 | HR 53 | Ht 70.0 in | Wt 180.0 lb

## 2016-01-14 DIAGNOSIS — E782 Mixed hyperlipidemia: Secondary | ICD-10-CM

## 2016-01-14 DIAGNOSIS — I2510 Atherosclerotic heart disease of native coronary artery without angina pectoris: Secondary | ICD-10-CM

## 2016-01-14 DIAGNOSIS — Z955 Presence of coronary angioplasty implant and graft: Secondary | ICD-10-CM

## 2016-01-14 DIAGNOSIS — I214 Non-ST elevation (NSTEMI) myocardial infarction: Secondary | ICD-10-CM

## 2016-01-14 MED ORDER — TICAGRELOR 60 MG PO TABS
60.0000 mg | ORAL_TABLET | Freq: Two times a day (BID) | ORAL | Status: DC
Start: 2016-01-14 — End: 2016-11-30

## 2016-01-14 NOTE — Progress Notes (Signed)
[facility]   Cardiology    Patient Name:  Raymond Cook  Date: 01/14/2016  Patient Number:  [id number]  DOB:  1966/10/11  Gender: male    Chief Complaint   Patient presents with   . Follow-up     5 month visit.    . Coronary Artery Disease     stable on Brilinta.        Assessment and Plan  Is active without Sx,denies CP/SOB/Palpit  Is tolerating all meds   Decrease Birlinta to 60 BID   Do reg stress test  F/u PCP  Encounter Diagnoses   Name Primary?   . Coronary artery disease involving native coronary artery of native heart without angina pectoris  CAD Therapeutic goals discussed with patient and provided information   Yes   . NSTEMI (non-ST elevated myocardial infarction)    . Mixed hyperlipidemia  Lipid profile and guideline goals with therapeutic and life style changes were discussed    . Stented coronary artery         Orders Placed This Encounter   Procedures   . ECG 12 lead (Today)   . Exercise Stress Test Tracing Only     Standing Status: Future      Number of Occurrences:       Standing Expiration Date: 01/14/2017       History of Present Illness   F/u CAD,HTN,HLP  All stable on current meds   Is active without Sx,denies CP/SOB/Palpit  Is tolerating all meds       No Known Allergies    Past Medical History   Diagnosis Date   . NSTEMI (non-ST elevated myocardial infarction)    . Coronary artery disease    . Hyperlipidemia    . SOB (shortness of breath) on exertion    . Stented coronary artery        Past Surgical History   Procedure Laterality Date   . Coronary angioplasty         Family History   Problem Relation Age of Onset   . Breast cancer Mother    . Diabetes Father      type I   . Leukemia Father        No images are attached to the encounter.    Medications:   Current Outpatient Prescriptions   Medication Sig Dispense Refill   . aspirin EC 81 MG EC tablet Take 81 mg by mouth daily.     Marland Kitchen atorvastatin (LIPITOR) 80 MG tablet Take 1 tablet (80 mg total) by mouth daily. 90 tablet 3   . esomeprazole  (NEXIUM) 20 MG capsule Take 20 mg by mouth every other day.        . lisinopril (PRINIVIL,ZESTRIL) 10 MG tablet Take 1 tablet (10 mg total) by mouth daily. 90 tablet 3   . metoprolol (LOPRESSOR) 25 MG tablet Take 1 tablet (25 mg total) by mouth 2 (two) times daily. 180 tablet 3   . Omega-3 Fatty Acids (OMEGA-3 FISH OIL) 500 MG Cap Take by mouth 2 (two) times daily.     . Ticagrelor (BRILINTA) 60 MG Tab Take 60 mg by mouth 2 (two) times daily. 180 tablet 3     No current facility-administered medications for this visit.       Physical Exam:     Filed Vitals:    01/14/16 1019   BP: 116/77   Pulse: 53   Height: 1.778 m (5\' 10" )   Weight: 81.647 kg (180 lb)  Body surface area is 2.01 meters squared.  Body mass index is 25.83 kg/(m^2).    Constitutional: well developed, nourished, no distress and alert and oriented x 3   HENT: atraumatic, nose normal, normocephalic, left exterior ear normal, right external ear normal and oropharynx clear and moist   Eyes: conjunctiva normal, EOM normal and PERRL   Neck: ROM normal, supple and trachea normal   Vascular WNL, no edema, normal pulses , no cellulitis   Cardiovascular: heart sounds normal, intact distal pulses, normal rate, regular rhythm and no murmur   Pulmonary/Chest Wall: breath sounds normal, effort normal and no rales   Abdominal: appearance normal, bowel sounds normal, soft and non-tender   Musculoskeletal: normal ROM, normal tone and strength   Neurological: awake, alert and oriented x 3 and gait normal, cranial nerves intact, motor and sensory normal   Skin: dry, intact and warm     LABS:  Lipid Panel   CHOLESTEROL   Date/Time Value Ref Range Status   07/26/2015 09:43 AM 151 125 - 200 mg/dL Final     TRIGLYCERIDES   Date/Time Value Ref Range Status   07/26/2015 09:43 AM 73 <150 mg/dL Final     Comment:     Fasting reference interval     HDL   Date/Time Value Ref Range Status   07/26/2015 09:43 AM 61 > or = 40 mg/dL Final     CBC No results found for: WBC, HGB, HCT,  MCV, PLT   BMP  Lab Results   Component Value Date    CO2 25 07/26/2015    BUN 10 07/26/2015     INR No results found for: INR, PROTIME    REVIEW OF SYSTEMS: All other systems reviewed and negative except as stated above.    Electronically signed by: Miachel Roux, MD 01/14/2016 10:36 AM

## 2016-01-17 ENCOUNTER — Encounter: Payer: Self-pay | Admitting: Internal Medicine

## 2016-01-17 ENCOUNTER — Ambulatory Visit (INDEPENDENT_AMBULATORY_CARE_PROVIDER_SITE_OTHER): Payer: Medicare Other | Admitting: Internal Medicine

## 2016-01-17 VITALS — BP 100/72 | HR 64 | Ht 75.0 in | Wt 226.8 lb

## 2016-01-17 DIAGNOSIS — I4901 Ventricular fibrillation: Secondary | ICD-10-CM

## 2016-01-17 LAB — CUP PACEART INCLINIC DEVICE CHECK
Battery Remaining Longevity: 64.8
Brady Statistic RV Percent Paced: 0 %
Date Time Interrogation Session: 20170120152207
HighPow Impedance: 81 Ohm
Implantable Lead Implant Date: 20110912
Implantable Lead Location: 753860
Implantable Lead Model: 7122
Lead Channel Impedance Value: 412.5 Ohm
Lead Channel Pacing Threshold Amplitude: 0.75 V
Lead Channel Pacing Threshold Amplitude: 0.75 V
Lead Channel Pacing Threshold Pulse Width: 0.5 ms
Lead Channel Pacing Threshold Pulse Width: 0.5 ms
Lead Channel Sensing Intrinsic Amplitude: 9.8 mV
Lead Channel Setting Pacing Amplitude: 2.5 V
Lead Channel Setting Pacing Pulse Width: 0.5 ms
Lead Channel Setting Sensing Sensitivity: 0.5 mV
Pulse Gen Serial Number: 618386

## 2016-01-17 NOTE — Progress Notes (Signed)
PCP: Dr Maudie Mercury Primary Cardiologist:  Dr Jeneen Rinks is a 50 y.o. male who presents today for routine electrophysiology followup.  He has advanced dementia.  He goes to daycare 5 days per week.  His mother thinks that his memory is " a little worse" than last year. Today, he denies symptoms of palpitations, chest pain,  lower extremity edema, dizziness, presyncope, syncope, or ICD shocks.  The patient is otherwise without complaint today.   Past Medical History  Diagnosis Date  . CAD (coronary artery disease)     nonobstructive  . VF (ventricular fibrillation) (HCC)     arrest  . 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 (doesn't say where LV goes to)   . 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 Anxiety   . Depression   . Automatic implantable cardiac defibrillator    Past Surgical History  Procedure Laterality Date  . Icd implantation      with defibrillation threshold testing. [employer]. Jude    Current Outpatient Prescriptions  Medication Sig Dispense Refill  . aspirin 325 MG EC tablet Take 325 mg by mouth daily.    . Cholecalciferol (VITAMIN D-3) 5000 UNITS TABS Take 5,000 Units by mouth daily.     . citalopram (CELEXA) 20 MG tablet Take 1 tablet (20 mg total) by mouth daily.    . Cyanocobalamin 1000 MCG/ML KIT Inject 1,000 mcg as directed every 30 (thirty) days.    . Divalproex Sodium (DEPAKOTE PO) Take 125 mg by mouth daily.     . folic acid (FOLVITE) 1 MG tablet Take 1 tablet (1 mg total) by mouth daily.    Marland Kitchen levothyroxine (SYNTHROID, LEVOTHROID) 25 MCG tablet Take 1 tablet (25 mcg total) by mouth daily before breakfast. 90 tablet 0  . LORazepam (ATIVAN) 0.5 MG tablet Take 0.5 mg by mouth daily.     . Multiple Vitamin (MULTIVITAMIN) tablet Take 1 tablet by mouth daily.    . simvastatin (ZOCOR) 5 MG tablet Take 1 tablet (5 mg total) by mouth daily. 30 tablet 11  . terazosin (HYTRIN) 1 MG capsule Take 1 mg by mouth daily.     Marland Kitchen thiamine 100 MG  tablet Take 1 tablet (100 mg total) by mouth daily.    . [DISCONTINUED] isosorbide mononitrate (IMDUR) 30 MG 24 hr tablet Take 15 mg by mouth at bedtime.       No current facility-administered medications for this visit.    Physical Exam: Filed Vitals:   01/17/16 1440  BP: 100/72  Pulse: 64  Height: '6\' 3"'$  (1.905 m)  Weight: 226 lb 12.8 oz (102.876 kg)    GEN- The patient is well appearing, alert but not oriented Head- normocephalic, atraumatic Eyes-  Sclera clear, conjunctiva pink Ears- hearing intact Oropharynx- clear Lungs- Clear to ausculation bilaterally, normal work of breathing Chest- ICD pocket is well healed Heart- Regular rate and rhythm, no murmurs, rubs or gallops, PMI not laterally displaced GI- soft, NT, ND, + BS Extremities- no clubbing, cyanosis, or edema  ICD interrogation- reviewed in detail today,  See PACEART report  Assessment and Plan:  1.  Coronary artery spasm/ prior VF arrest Normal ICD function See Pace Art report No changes today  I have discussed SJM Fortify Assura advisary with the patient today. He understands that recommendation from SJM is to not replace the device at this time. The patient is not device dependant.  The patient has had  appropriate device therapy in the past or implanted for secondary prevention.  Vibratory alert demonstrated today.  I have encouraged remote monitoring and understands the importance of compliance today.  We have decided together to follow conservatively rather than consider device replacement.  Thompson Grayer MD, [facility] 01/17/2016 3:39 PM

## 2016-01-17 NOTE — Patient Instructions (Signed)
Medication Instructions:  Your physician recommends that you continue on your current medications as directed. Please refer to the Current Medication list given to you today.   Labwork: None ordered   Testing/Procedures: None ordered   Follow-Up: Your physician wants you to follow-up in: 12 months with Adam Balsam, NP. You will receive a reminder letter in the mail two months in advance. If you don't receive a letter, please call our office to schedule the follow-up appointment.  Remote monitoring is used to monitor your ICD from home. This monitoring reduces the number of office visits required to check your device to one time per year. It allows [place] to keep an eye on the functioning of your device to ensure it is working properly. You are scheduled for a device check from home on 04/20/16. You may send your transmission at any time that day. If you have a wireless device, the transmission will be sent automatically. After your physician reviews your transmission, you will receive a postcard with your next transmission date.     Any Other Special Instructions Will Be Listed Below (If Applicable).     If you need a refill on your cardiac medications before your next appointment, please call your pharmacy.

## 2016-01-29 ENCOUNTER — Encounter (INDEPENDENT_AMBULATORY_CARE_PROVIDER_SITE_OTHER): Payer: No Typology Code available for payment source

## 2016-02-03 ENCOUNTER — Ambulatory Visit (INDEPENDENT_AMBULATORY_CARE_PROVIDER_SITE_OTHER): Payer: No Typology Code available for payment source | Admitting: Cardiovascular Disease

## 2016-02-03 DIAGNOSIS — I2510 Atherosclerotic heart disease of native coronary artery without angina pectoris: Secondary | ICD-10-CM

## 2016-02-03 DIAGNOSIS — E782 Mixed hyperlipidemia: Secondary | ICD-10-CM

## 2016-02-03 DIAGNOSIS — I214 Non-ST elevation (NSTEMI) myocardial infarction: Secondary | ICD-10-CM

## 2016-02-03 DIAGNOSIS — R0602 Shortness of breath: Secondary | ICD-10-CM

## 2016-02-03 DIAGNOSIS — Z955 Presence of coronary angioplasty implant and graft: Secondary | ICD-10-CM

## 2016-02-03 NOTE — Procedures (Signed)
[facility]  Exercise Stress Test    Patient: Raymond Cook  Sex: male  DOB: [birth year]  MRN:  [id number]     Test Date:  02/03/2016     Interpretation Date: 02/03/2016    Referring Physician:    Rendering Physician: Miachel Roux, MD, [facility]    INDICATION:   1. Coronary artery disease involving native coronary artery of native heart without angina pectoris    2. NSTEMI (non-ST elevated myocardial infarction)    3. Mixed hyperlipidemia    4. Stented coronary artery    5. SOB (shortness of breath) on exertion        GRADED ECG EXERCISE TEST:    Resting ECG: Normal sinus rhythm, no significant ST changes.     Blood Pressure:  Rest = 122/80  Maximum = 148/72  Heart Rate:     Rest = 73  Maximum = 153 (90% MPHR).   METS achieved: 12.8    Patient Symptoms: no significant symptoms    Standard Bruce Protocol exercise time: 10 minutes, 1 seconds.       Reason for end: achieved target heart rate    Achieved target heart rate: Yes    ECG findings at peak exercise: This is a Normal Stress Test with no ishemic Electrocardiogram changes    Arrhythmias: none    CONCLUSION:  1. No clinical or electrocardiographic evidence of exercise-induced myocardial ischemia at the heart rate achieved.   2. The patient had Normal at rest and demonstrated a Normal response to exam.  3. The patient demonstrated a average functional capacity for age and gender.       Signed by: Miachel Roux, MD, [facility]  [facility] [place]

## 2016-02-18 ENCOUNTER — Encounter (INDEPENDENT_AMBULATORY_CARE_PROVIDER_SITE_OTHER): Payer: Self-pay | Admitting: Cardiovascular Disease

## 2016-02-18 ENCOUNTER — Ambulatory Visit (INDEPENDENT_AMBULATORY_CARE_PROVIDER_SITE_OTHER): Payer: No Typology Code available for payment source | Admitting: Cardiovascular Disease

## 2016-02-18 VITALS — BP 132/81 | HR 55 | Ht 70.0 in | Wt 178.0 lb

## 2016-02-18 DIAGNOSIS — E782 Mixed hyperlipidemia: Secondary | ICD-10-CM

## 2016-02-18 DIAGNOSIS — I214 Non-ST elevation (NSTEMI) myocardial infarction: Secondary | ICD-10-CM

## 2016-02-18 DIAGNOSIS — Z955 Presence of coronary angioplasty implant and graft: Secondary | ICD-10-CM

## 2016-02-18 DIAGNOSIS — I2510 Atherosclerotic heart disease of native coronary artery without angina pectoris: Secondary | ICD-10-CM

## 2016-02-18 NOTE — Progress Notes (Signed)
[facility]   Cardiology    Patient Name:  Raymond Cook  Date: 02/18/2016  Patient Number:  [id number]  DOB:  November 07, 1966  Gender: male    Chief Complaint   Patient presents with   . Follow-up     to recent stress test results.    . Coronary Artery Disease     has not started Brilinta 60mg  BID yet.        Assessment and Plan  All stable on current meds   Reg stress test 02/03/2016 was normal ,12 METS,10 min  Denies CP/SOB/Palpit  Cardiac wise stable on current care   F/u PCP  Encounter Diagnoses   Name Primary?   . Coronary artery disease involving native coronary artery of native heart without angina pectoris  CAD Therapeutic goals discussed with patient and provided information   Yes   . NSTEMI (non-ST elevated myocardial infarction)    . Mixed hyperlipidemia  Lipid profile and guideline goals with therapeutic and life style changes were discussed    . Stented coronary artery         No orders of the defined types were placed in this encounter.       History of Present Illness   F/u CAD,HLP  All stable on current meds   Reg stress test 02/03/2016 was normal ,12 METS,10 min  Denies CP/SOB/Palpit    No Known Allergies    Past Medical History   Diagnosis Date   . NSTEMI (non-ST elevated myocardial infarction)    . Coronary artery disease    . Hyperlipidemia    . SOB (shortness of breath) on exertion    . Stented coronary artery        Past Surgical History   Procedure Laterality Date   . Coronary angioplasty         Family History   Problem Relation Age of Onset   . Breast cancer Mother    . Diabetes Father      type I   . Leukemia Father        No images are attached to the encounter.    Medications:   Current Outpatient Prescriptions   Medication Sig Dispense Refill   . aspirin EC 81 MG EC tablet Take 81 mg by mouth daily.     Marland Kitchen atorvastatin (LIPITOR) 80 MG tablet Take 1 tablet (80 mg total) by mouth daily. 90 tablet 3   . esomeprazole (NEXIUM) 20 MG capsule Take 20 mg by mouth every other day.        . lisinopril  (PRINIVIL,ZESTRIL) 10 MG tablet Take 1 tablet (10 mg total) by mouth daily. 90 tablet 3   . metoprolol (LOPRESSOR) 25 MG tablet Take 1 tablet (25 mg total) by mouth 2 (two) times daily. 180 tablet 3   . Omega-3 Fatty Acids (OMEGA-3 FISH OIL) 500 MG Cap Take by mouth 2 (two) times daily.     . Ticagrelor (BRILINTA) 60 MG Tab Take 60 mg by mouth 2 (two) times daily. (Patient taking differently: Take 90 mg by mouth 2 (two) times daily.   ) 180 tablet 3     No current facility-administered medications for this visit.       Physical Exam:     Filed Vitals:    02/18/16 0924   BP: 132/81   Pulse: 55   Height: 1.778 m (5\' 10" )   Weight: 80.74 kg (178 lb)   Body surface area is 2.00 meters squared.  Body mass  index is 25.54 kg/(m^2).    Constitutional: well developed, nourished, no distress and alert and oriented x 3   HENT: atraumatic, nose normal, normocephalic, left exterior ear normal, right external ear normal and oropharynx clear and moist   Eyes: conjunctiva normal, EOM normal and PERRL   Neck: ROM normal, supple and trachea normal   Vascular WNL, no edema, normal pulses , no cellulitis   Cardiovascular: heart sounds normal, intact distal pulses, normal rate, regular rhythm and no murmur   Pulmonary/Chest Wall: breath sounds normal, effort normal and no rales   Abdominal: appearance normal, bowel sounds normal, soft and non-tender   Musculoskeletal: normal ROM, normal tone and strength   Neurological: awake, alert and oriented x 3 and gait normal, cranial nerves intact, motor and sensory normal   Skin: dry, intact and warm     LABS:  Lipid Panel   CHOLESTEROL   Date/Time Value Ref Range Status   07/26/2015 09:43 AM 151 125 - 200 mg/dL Final     TRIGLYCERIDES   Date/Time Value Ref Range Status   07/26/2015 09:43 AM 73 <150 mg/dL Final     Comment:     Fasting reference interval     HDL   Date/Time Value Ref Range Status   07/26/2015 09:43 AM 61 > or = 40 mg/dL Final     CBC No results found for: WBC, HGB, HCT, MCV, PLT    BMP  Lab Results   Component Value Date    CO2 25 07/26/2015    BUN 10 07/26/2015     INR No results found for: INR, PROTIME    REVIEW OF SYSTEMS: All other systems reviewed and negative except as stated above.    Electronically signed by: Miachel Roux, MD 02/18/2016 9:43 AM

## 2016-03-16 DIAGNOSIS — R413 Other amnesia: Secondary | ICD-10-CM | POA: Diagnosis not present

## 2016-03-16 DIAGNOSIS — E512 Wernicke's encephalopathy: Secondary | ICD-10-CM | POA: Diagnosis not present

## 2016-04-20 ENCOUNTER — Encounter: Payer: Medicare Other | Admitting: *Deleted

## 2016-04-20 ENCOUNTER — Telehealth: Payer: Self-pay | Admitting: Cardiology

## 2016-04-20 NOTE — Telephone Encounter (Signed)
LMOVM reminding pt to send remote transmission.   

## 2016-04-24 ENCOUNTER — Encounter: Payer: Self-pay | Admitting: Cardiology

## 2016-04-30 ENCOUNTER — Telehealth: Payer: Self-pay | Admitting: Cardiology

## 2016-04-30 NOTE — Telephone Encounter (Signed)
LMOVM requesting that pt send manual transmission b/c home monitor has not updated in at least 8 days.   

## 2016-06-01 DIAGNOSIS — I2510 Atherosclerotic heart disease of native coronary artery without angina pectoris: Secondary | ICD-10-CM | POA: Diagnosis not present

## 2016-06-01 DIAGNOSIS — E559 Vitamin D deficiency, unspecified: Secondary | ICD-10-CM | POA: Diagnosis not present

## 2016-06-01 DIAGNOSIS — E039 Hypothyroidism, unspecified: Secondary | ICD-10-CM | POA: Diagnosis not present

## 2016-06-01 DIAGNOSIS — F04 Amnestic disorder due to known physiological condition: Secondary | ICD-10-CM | POA: Diagnosis not present

## 2016-06-04 DIAGNOSIS — E559 Vitamin D deficiency, unspecified: Secondary | ICD-10-CM | POA: Diagnosis not present

## 2016-06-04 DIAGNOSIS — N400 Enlarged prostate without lower urinary tract symptoms: Secondary | ICD-10-CM | POA: Diagnosis not present

## 2016-06-04 DIAGNOSIS — E039 Hypothyroidism, unspecified: Secondary | ICD-10-CM | POA: Diagnosis not present

## 2016-06-04 DIAGNOSIS — I2510 Atherosclerotic heart disease of native coronary artery without angina pectoris: Secondary | ICD-10-CM | POA: Diagnosis not present

## 2016-07-09 ENCOUNTER — Ambulatory Visit: Payer: Medicare Other | Admitting: Nurse Practitioner

## 2016-07-10 ENCOUNTER — Ambulatory Visit: Payer: Medicare Other | Admitting: Nurse Practitioner

## 2016-07-17 ENCOUNTER — Ambulatory Visit: Payer: Medicare Other | Admitting: Nurse Practitioner

## 2016-08-21 ENCOUNTER — Ambulatory Visit: Payer: Medicare Other | Admitting: Nurse Practitioner

## 2016-08-24 ENCOUNTER — Ambulatory Visit (INDEPENDENT_AMBULATORY_CARE_PROVIDER_SITE_OTHER): Payer: Medicare Other | Admitting: Nurse Practitioner

## 2016-08-24 ENCOUNTER — Encounter: Payer: Self-pay | Admitting: Nurse Practitioner

## 2016-08-24 VITALS — BP 103/70 | HR 70 | Ht 75.0 in | Wt 212.8 lb

## 2016-08-24 DIAGNOSIS — F0390 Unspecified dementia without behavioral disturbance: Secondary | ICD-10-CM

## 2016-08-24 DIAGNOSIS — R413 Other amnesia: Secondary | ICD-10-CM

## 2016-08-24 MED ORDER — CYANOCOBALAMIN 1000 MCG/ML IJ KIT: 1000.0000 ug | PACK | INTRAMUSCULAR | 6 refills | Status: DC
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
## 2016-08-24 NOTE — Patient Instructions (Signed)
Memory score is stable Continue B12 IM monthly  F/U in 6 months next with Dr. Terrace ArabiaYan

## 2016-08-26 ENCOUNTER — Telehealth (INDEPENDENT_AMBULATORY_CARE_PROVIDER_SITE_OTHER): Payer: Self-pay | Admitting: Cardiovascular Disease

## 2016-08-26 NOTE — Telephone Encounter (Signed)
Patient is out of town and have some questions regarding elevation. He can be contact on his mobile number    Thanks,  js

## 2016-08-26 NOTE — Telephone Encounter (Signed)
I d/w Dr. Aneta Mins and he stated verbally that he should be fine,just understand that with the the high altitude there is a risk of nose bleeds, and that he should continue all of his medications to include ASA and Brilinta. I called and spoke to his wife and she stated understanding.       Raymond Cook

## 2016-08-26 NOTE — Telephone Encounter (Signed)
This patient is going to be at high altitude of 9000 ft, he would like to know if he should be concerned with his stents?        Thanks,  Serina Cowper

## 2016-08-27 NOTE — Progress Notes (Signed)
I agree with the above plan 

## 2016-09-25 DIAGNOSIS — H1789 Other corneal scars and opacities: Secondary | ICD-10-CM | POA: Diagnosis not present

## 2016-09-25 DIAGNOSIS — H524 Presbyopia: Secondary | ICD-10-CM | POA: Diagnosis not present

## 2016-11-30 ENCOUNTER — Encounter (INDEPENDENT_AMBULATORY_CARE_PROVIDER_SITE_OTHER): Payer: Self-pay | Admitting: Cardiovascular Disease

## 2016-11-30 ENCOUNTER — Ambulatory Visit (INDEPENDENT_AMBULATORY_CARE_PROVIDER_SITE_OTHER): Payer: No Typology Code available for payment source | Admitting: Cardiovascular Disease

## 2016-11-30 VITALS — BP 135/84 | HR 60 | Ht 70.0 in | Wt 195.8 lb

## 2016-11-30 DIAGNOSIS — I10 Essential (primary) hypertension: Secondary | ICD-10-CM

## 2016-11-30 DIAGNOSIS — E782 Mixed hyperlipidemia: Secondary | ICD-10-CM

## 2016-11-30 DIAGNOSIS — Z955 Presence of coronary angioplasty implant and graft: Secondary | ICD-10-CM

## 2016-11-30 DIAGNOSIS — I2510 Atherosclerotic heart disease of native coronary artery without angina pectoris: Secondary | ICD-10-CM

## 2016-11-30 DIAGNOSIS — I214 Non-ST elevation (NSTEMI) myocardial infarction: Secondary | ICD-10-CM

## 2016-11-30 MED ORDER — LISINOPRIL 20 MG PO TABS
ORAL_TABLET | ORAL | 3 refills | Status: DC
Start: 2016-11-30 — End: 2021-12-02

## 2016-11-30 MED ORDER — TICAGRELOR 60 MG PO TABS
60.0000 mg | ORAL_TABLET | Freq: Two times a day (BID) | ORAL | 3 refills | Status: AC
Start: 2016-11-30 — End: 2017-02-28

## 2016-11-30 NOTE — Progress Notes (Signed)
[facility]   Cardiology    Patient Name:  Raymond Cook  Date: 11/30/2016  Patient Number:  [id number]  DOB:  03-19-1966  Gender: male    Chief Complaint   Patient presents with   . Coronary Artery Disease     10 month visit       Assessment and Plan  Denies CP/SOB/Palpit  2 yrs post DES   Lipids checked by PCP and he reports to be ok   BP has been higher 130-140,s/80,s  Will increase Lisinopril 10 to 20 qd ,monitor BP/BMP ,F/U PCP  Encounter Diagnoses   Name Primary?   . Essential hypertension  Monitor BP at home and follow up as needed Yes   . Coronary artery disease involving native coronary artery of native heart without angina pectoris  CAD Therapeutic goals discussed with patient and provided information      . NSTEMI (non-ST elevated myocardial infarction)    . Mixed hyperlipidemia  Lipid profile and guideline goals with therapeutic and life style changes were discussed      . Stented coronary artery         Orders Placed This Encounter   Procedures   . Basic Metabolic Panel     Non-fasting     Standing Status:   Future     Standing Expiration Date:   12/01/2017     Order Specific Question:   Has the patient fasted?     Answer:   No       History of Present Illness   F/u CAD,HLP  Denies CP/SOB/Palpit  2 yrs post DES   Lipids checked by PCP and he reports to be ok   BP has been higher 130-140,s/80,s  No Known Allergies    Past Medical History:   Diagnosis Date   . Coronary artery disease    . Hyperlipidemia    . NSTEMI (non-ST elevated myocardial infarction)    . SOB (shortness of breath) on exertion    . Stented coronary artery        Past Surgical History:   Procedure Laterality Date   . CORONARY ANGIOPLASTY         Family History   Problem Relation Age of Onset   . Breast cancer Mother    . Diabetes Father      type I   . Leukemia Father        No images are attached to the encounter.    Medications:   Current Outpatient Prescriptions   Medication Sig Dispense Refill   . aspirin EC 81 MG EC tablet Take  81 mg by mouth daily.     Marland Kitchen atorvastatin (LIPITOR) 80 MG tablet TAKE 1 TABLET BY MOUTH EVERY DAY  3   . esomeprazole (NEXIUM) 20 MG capsule Take 20 mg by mouth every other day.        . lisinopril (PRINIVIL,ZESTRIL) 20 MG tablet TAKE 1 TABLET BY MOUTH DAILY 90 tablet 3   . metoprolol tartrate (LOPRESSOR) 25 MG tablet TAKE 1 TABLET BY MOUTH TWICE A DAY  3   . Omega-3 Fatty Acids (OMEGA-3 FISH OIL) 500 MG Cap Take by mouth 2 (two) times daily.     . Ticagrelor (BRILINTA) 60 MG Tab Take 60 mg by mouth 2 (two) times daily. 180 tablet 3     No current facility-administered medications for this visit.        Physical Exam:     Vitals:    11/30/16 1412  11/30/16 1432   BP: 142/79 135/84   Pulse: 60    Weight: 88.8 kg (195 lb 12.8 oz)    Height: 1.778 m (5\' 10" )    Body surface area is 2.09 meters squared.  Body mass index is 28.09 kg/m.    Constitutional: well developed, nourished, no distress and alert and oriented x 3   HENT: atraumatic, nose normal, normocephalic, left exterior ear normal, right external ear normal and oropharynx clear and moist   Eyes: conjunctiva normal, EOM normal and PERRL   Neck: ROM normal, supple and trachea normal   Vascular WNL, no edema, normal pulses , no cellulitis   Cardiovascular: heart sounds normal, intact distal pulses, normal rate, regular rhythm and no murmur   Pulmonary/Chest Wall: breath sounds normal, effort normal and no rales   Abdominal: appearance normal, bowel sounds normal, soft and non-tender   Musculoskeletal: normal ROM, normal tone and strength   Neurological: awake, alert and oriented x 3 and gait normal, cranial nerves intact, motor and sensory normal   Skin: dry, intact and warm     LABS:  Lipid Panel   Cholesterol   Date/Time Value Ref Range Status   07/26/2015 09:43 AM 151 125 - 200 mg/dL Final     Triglycerides   Date/Time Value Ref Range Status   07/26/2015 09:43 AM 73 <150 mg/dL Final     Comment:     Fasting reference interval     HDL   Date/Time Value Ref Range  Status   07/26/2015 09:43 AM 61 > or = 40 mg/dL Final     CBC No results found for: WBC, HGB, HCT, MCV, PLT   BMP  Lab Results   Component Value Date    CO2 25 07/26/2015    BUN 10 07/26/2015     INR No results found for: INR, PROTIME    REVIEW OF SYSTEMS: All other systems reviewed and negative except as stated above.    Electronically signed by: Miachel Roux, MD 11/30/2016 2:39 PM

## 2016-12-03 DIAGNOSIS — N400 Enlarged prostate without lower urinary tract symptoms: Secondary | ICD-10-CM | POA: Diagnosis not present

## 2016-12-03 DIAGNOSIS — E559 Vitamin D deficiency, unspecified: Secondary | ICD-10-CM | POA: Diagnosis not present

## 2016-12-03 DIAGNOSIS — E039 Hypothyroidism, unspecified: Secondary | ICD-10-CM | POA: Diagnosis not present

## 2016-12-03 DIAGNOSIS — I2510 Atherosclerotic heart disease of native coronary artery without angina pectoris: Secondary | ICD-10-CM | POA: Diagnosis not present

## 2016-12-03 NOTE — Progress Notes (Addendum)
Electrophysiology Office Note Date: 12/04/2016  ID:  Adam Orr, DOB Dec 09, 1966, MRN [id number]  PCP: Jani Gravel, MD Primary Cardiologist: Burt Knack Electrophysiologist: Allred  CC: Routine ICD follow-up  Adam Orr is a 50 y.o. male seen today for Dr Rayann Heman.  He presents today for routine electrophysiology followup.  Since last being seen in our clinic, the patient reports doing very well. He denies chest pain, palpitations, dyspnea, PND, orthopnea, nausea, vomiting, dizziness, syncope, edema, weight gain, or early satiety.  He has not had ICD shocks.   Device History: STJ single chamber ICD implanted 2011 for VF arrest History of appropriate therapy: No History of AAD therapy: No   Past Medical History:  Diagnosis Date  . Anxiety   . Automatic implantable cardiac defibrillator   . CAD (coronary artery disease)    nonobstructive  . 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 Depression   . 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 (doesn't say where LV goes to)   . VF (ventricular fibrillation) ([place])    arrest   Past Surgical History:  Procedure Laterality Date  . ICD implantation     with defibrillation threshold testing. St. Jude    Current Outpatient Prescriptions  Medication Sig Dispense Refill  . aspirin 325 MG EC tablet Take 325 mg by mouth daily.    . Cholecalciferol (VITAMIN D-3) 5000 UNITS TABS Take 5,000 Units by mouth daily.     . citalopram (CELEXA) 20 MG tablet Take 1 tablet (20 mg total) by mouth daily.    . Cyanocobalamin 1000 MCG/ML KIT Inject 1,000 mcg as directed every 30 (thirty) days. 1 kit 6  . Divalproex Sodium (DEPAKOTE PO) Take 125 mg by mouth daily.     . folic acid (FOLVITE) 1 MG tablet Take 1 tablet (1 mg total) by mouth daily.    Marland Kitchen levothyroxine (SYNTHROID, LEVOTHROID) 25 MCG tablet Take 1 tablet (25 mcg total) by mouth daily before breakfast. 90 tablet 0  . LORazepam (ATIVAN) 0.5 MG tablet Take 0.5 mg by mouth  daily.     . Multiple Vitamin (MULTIVITAMIN) tablet Take 1 tablet by mouth daily.    . simvastatin (ZOCOR) 5 MG tablet Take 1 tablet (5 mg total) by mouth daily. 30 tablet 11  . terazosin (HYTRIN) 1 MG capsule Take 1 mg by mouth daily.     Marland Kitchen thiamine 100 MG tablet Take 1 tablet (100 mg total) by mouth daily.     No current facility-administered medications for this visit.     Allergies:   Penicillins; Mirtazapine; Smallpox vaccine; Namenda [memantine hcl]; and Melatonin   Social History: Social History   Social History  . Marital status: Divorced    Spouse name: N/A  . Number of children: 2  . Years of education: N/A   Occupational History  .  Unemployed    Disabled  .      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 Smoking status: Former Smoker    Types: Cigarettes    Start date: 01/23/2010  . Smokeless tobacco: Never Used     Comment: he continues to smoke occasionally, smoked for more than 20 years.   . Alcohol use 50.4 oz/week    84 Cans of beer per week  . Drug use: No  . Sexual activity: Not on file   Other Topics Concern  . Not on file   Social History Narrative  Divorced, 2 children.    Patient lives at home with his mother Khing Belcher.   Patient was in [employer] for 12 years.     Family History: Family History  Problem Relation Age of Onset  . Coronary artery disease Mother   . Prostate cancer Father   . Prostate cancer Paternal Grandfather     Review of Systems: All other systems reviewed and are otherwise negative except as noted above.   Physical Exam: VS:  BP 110/60   Pulse (!) 56   Ht _0  (1.905 m)   Wt 215 lb (97.5 kg)   BMI 26.87 kg/m  , BMI Body mass index is 26.87 kg/m.  GEN- The patient is well appearing, alert but not oriented   HEENT: normocephalic, atraumatic; sclera clear, conjunctiva pink; hearing intact; oropharynx clear; neck supple  Lungs- Clear to ausculation bilaterally, normal work of breathing.  No  wheezes, rales, rhonchi Heart- Regular rate and rhythm  GI- soft, non-tender, non-distended, bowel sounds present  Extremities- no clubbing, cyanosis, or edema  MS- no significant deformity or atrophy Skin- warm and dry, no rash or lesion; ICD pocket well healed Psych- euthymic mood, full affect Neuro- strength and sensation are intact  ICD interrogation- reviewed in detail today,  See PACEART report  EKG:  EKG is not ordered today.  Recent Labs: No results found for requested labs within last 8760 hours.   Wt Readings from Last 3 Encounters:  12/04/16 215 lb (97.5 kg)  12/04/16 215 lb 6.4 oz (97.7 kg)  08/24/16 212 lb 12.8 oz (96.5 kg)     Other studies Reviewed: Additional studies/ records that were reviewed today include: Dr Jackalyn Lombard office notes  Assessment and Plan:  1.  Coronary spasm/prior VF arrest Normal ICD function See Pace Art report No changes today I have discussed SJM Fortify Assura advisary with the patient today. He understands that recommendation from SJM is to not replace the device at this time. The patient is not device dependant.  The patient has had appropriate device therapy in the past or implanted for secondary prevention.  Vibratory alert demonstrated today.  He is actively remotely monitored and understands the importance of compliance today.     Current medicines are reviewed at length with the patient today.   The patient does not have concerns regarding his medicines.  The following changes were made today:  none  Labs/ tests ordered today include: none No orders of the defined types were placed in this encounter.   Disposition:   Follow up with Delilah Shan, Dr Burt Knack as scheduled, Dr Rayann Heman 6 months at patient's mother's request.    Signed, Chanetta Marshall, NP 12/04/2016 9:01 AM  [facility] [address] [place] [address] [phone] (office) [phone] (fax

## 2016-12-04 ENCOUNTER — Encounter (INDEPENDENT_AMBULATORY_CARE_PROVIDER_SITE_OTHER): Payer: Self-pay

## 2016-12-04 ENCOUNTER — Ambulatory Visit (INDEPENDENT_AMBULATORY_CARE_PROVIDER_SITE_OTHER): Payer: Medicare Other | Admitting: Nurse Practitioner

## 2016-12-04 ENCOUNTER — Encounter: Payer: Self-pay | Admitting: Nurse Practitioner

## 2016-12-04 ENCOUNTER — Encounter: Payer: Self-pay | Admitting: Cardiovascular Disease

## 2016-12-04 ENCOUNTER — Ambulatory Visit (INDEPENDENT_AMBULATORY_CARE_PROVIDER_SITE_OTHER): Payer: Medicare Other | Admitting: Cardiovascular Disease

## 2016-12-04 VITALS — BP 110/60 | HR 56 | Ht 75.0 in | Wt 215.4 lb

## 2016-12-04 VITALS — BP 110/60 | HR 56 | Ht 75.0 in | Wt 215.0 lb

## 2016-12-04 DIAGNOSIS — I4901 Ventricular fibrillation: Secondary | ICD-10-CM

## 2016-12-04 DIAGNOSIS — E785 Hyperlipidemia, unspecified: Secondary | ICD-10-CM | POA: Diagnosis not present

## 2016-12-04 DIAGNOSIS — Z95810 Presence of automatic (implantable) cardiac defibrillator: Secondary | ICD-10-CM | POA: Diagnosis not present

## 2016-12-04 LAB — CUP PACEART INCLINIC DEVICE CHECK
Date Time Interrogation Session: 20171208095042
Implantable Lead Implant Date: 20110912
Implantable Lead Location: 753860
Implantable Lead Model: 7122
Implantable Pulse Generator Implant Date: 20110912
Pulse Gen Serial Number: 618386

## 2016-12-04 NOTE — Progress Notes (Signed)
Cardiology Office Note Date:  12/04/2016   ID:  Adam Orr, DOB 1966-05-24, MRN [id number]  PCP:  Jani Gravel, MD  Cardiologist:  Sherren Mocha, MD    Chief Complaint  Patient presents with  . Coronary Artery Disease   History of Present Illness: Adam Orr is a 50 y.o. male who presents for follow-up evaluation. He was last seen 06/24/2015. He is here with his mother today. 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 The patient has undergone ICD implantation. 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   Today, he denies symptoms of palpitations, chest pain, shortness of breath, orthopnea, PND, lower extremity edema, dizziness, or syncope.   Past Medical History:  Diagnosis Date  . Anxiety   . Automatic implantable cardiac defibrillator   . CAD (coronary artery disease)    nonobstructive  . 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 Depression   . 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 (doesn't say where LV goes to)   . VF (ventricular fibrillation) ([place])    arrest    Past Surgical History:  Procedure Laterality Date  . ICD implantation     with defibrillation threshold testing. St. Jude    Current Outpatient Prescriptions  Medication Sig Dispense Refill  . aspirin 325 MG EC tablet Take 325 mg by mouth daily.    . Cholecalciferol (VITAMIN D-3) 5000 UNITS TABS Take 5,000 Units by mouth daily.     . citalopram (CELEXA) 20 MG tablet Take 1 tablet (20 mg total) by mouth daily.    . Cyanocobalamin 1000 MCG/ML KIT Inject 1,000 mcg as directed every 30 (thirty) days. 1 kit 6  . Divalproex Sodium (DEPAKOTE PO) Take 125 mg by mouth daily.     . folic acid (FOLVITE) 1 MG tablet Take 1 tablet (1 mg total) by mouth daily.    Marland Kitchen levothyroxine (SYNTHROID, LEVOTHROID) 25 MCG tablet Take 1 tablet (25 mcg total) by mouth daily before breakfast. 90  tablet 0  . LORazepam (ATIVAN) 0.5 MG tablet Take 0.5 mg by mouth daily.     . Multiple Vitamin (MULTIVITAMIN) tablet Take 1 tablet by mouth daily.    . simvastatin (ZOCOR) 5 MG tablet Take 1 tablet (5 mg total) by mouth daily. 30 tablet 11  . terazosin (HYTRIN) 1 MG capsule Take 1 mg by mouth daily.     Marland Kitchen thiamine 100 MG tablet Take 1 tablet (100 mg total) by mouth daily.     No current facility-administered medications for this visit.     Allergies:   Penicillins; Mirtazapine; Smallpox vaccine; Namenda [memantine hcl]; and Melatonin   Social History:  The patient  reports that he has quit smoking. His smoking use included Cigarettes. He started smoking about 6 years ago. He has never used smokeless tobacco. He reports that he drinks about 50.4 oz of alcohol per week . He reports that he does not use drugs.   Family History:  The patient's  family history includes Coronary artery disease in his mother; Prostate cancer in his father and paternal grandfather.    ROS:  Please see the history of present illness. All other systems are reviewed and negative.    PHYSICAL EXAM: VS:  BP 110/60   Pulse (!) 56   Ht '6\' 3"'$  (1.905 m)   Wt 215 lb 6.4 oz (97.7 kg)   BMI 26.92  kg/m  , BMI Body mass index is 26.92 kg/m. GEN: Well nourished, well developed, in no acute distress  HEENT: normal  Neck: no JVD, no masses. No carotid bruits Cardiac: bradycardic and regular without murmur or gallop                Respiratory:  clear to auscultation bilaterally, normal work of breathing GI: soft, nontender, nondistended, + BS MS: no deformity or atrophy  Ext: no pretibial edema, pedal pulses 2+= bilaterally Skin: warm and dry, no rash Neuro:  Strength and sensation are intact Psych: euthymic mood, full affect  EKG:  EKG is ordered today. The ekg ordered today shows sinus bradycardia 56 bpm, otherwise within normal limits.  Recent Labs: No results found for requested labs within last 8760 hours.    Lipid Panel     Component Value Date/Time   CHOL 203 (H) 11/24/2010 2142   TRIG 195 (H) 11/24/2010 2142   HDL 54 11/24/2010 2142   CHOLHDL 3.8 Ratio 11/24/2010 2142   VLDL 39 11/24/2010 2142   LDLCALC 110 (H) 11/24/2010 2142      Wt Readings from Last 3 Encounters:  12/04/16 215 lb (97.5 kg)  12/04/16 215 lb 6.4 oz (97.7 kg)  08/24/16 212 lb 12.8 oz (96.5 kg)     Cardiac Studies Reviewed: Echo 12-26-2012: Study Conclusions  - Left ventricle: The cavity size was normal. Wall thickness was normal. Systolic function was normal. The estimated ejection fraction was in the range of 50% to 55%. Wall motion was normal; there were no regional wall motion abnormalities. Doppler parameters are consistent with abnormal left ventricular relaxation (grade 1 diastolic dysfunction). - Mitral valve: Calcified annulus.  ASSESSMENT AND PLAN: 1.  Coronary artery disease, native vessel, with Prinzmetal angina: Patient is stable. He's had no chest pain. He will continue  2. History of VF arrest: His ICD will be checked today. There have been no discharges per his report.  3. Hyperlipidemia: He takes simvastatin. He is followed by his primary care physician.  Current medicines are reviewed with the patient today.  The patient does not have concerns regarding medicines.  Labs/ tests ordered today include:   Orders Placed This Encounter  Procedures  . EKG 12-Lead    Disposition:   FU one year  Signed, Sherren Mocha, MD  12/04/2016 1:58 PM    [facility] [place], [place], [place]  [address] Phone: [phone]; Fax: [phone]

## 2016-12-04 NOTE — Patient Instructions (Signed)
Medication Instructions:   Your physician recommends that you continue on your current medications as directed. Please refer to the Current Medication list given to you today.   If you need a refill on your cardiac medications before your next appointment, please call your pharmacy.  Labwork: NONE ORDERED  TODAY    Testing/Procedures: NONE ORDERED  TODAY    Follow-Up:  Your physician wants you to follow-up in: 6 MONTHS  the mail two months in advance. If you don't receive a letter, please call our office to schedule the follow-up appointment.   Remote monitoring is used to monitor your Pacemaker of ICD from home. This monitoring reduces the number of office visits required to check your device to one time per year. It allows us to keep an eye on the functioning of your device to ensure it is working properly. You are scheduled for a device check from home on 03/05/2017..You may send your transmission at any time that day. If you have a wireless device, the transmission will be sent automatically. After your physician reviews your transmission, you will receive a postcard with your next transmission date.    Any Other Special Instructions Will Be Listed Below (If Applicable).

## 2016-12-04 NOTE — Patient Instructions (Signed)

## 2016-12-08 ENCOUNTER — Ambulatory Visit (INDEPENDENT_AMBULATORY_CARE_PROVIDER_SITE_OTHER): Payer: Medicare Other | Admitting: Neurology

## 2016-12-08 ENCOUNTER — Encounter: Payer: Self-pay | Admitting: Neurology

## 2016-12-08 VITALS — BP 127/78 | HR 68 | Ht 75.0 in | Wt 214.0 lb

## 2016-12-08 DIAGNOSIS — F1010 Alcohol abuse, uncomplicated: Secondary | ICD-10-CM

## 2016-12-08 DIAGNOSIS — E519 Thiamine deficiency, unspecified: Secondary | ICD-10-CM

## 2016-12-08 DIAGNOSIS — R413 Other amnesia: Secondary | ICD-10-CM | POA: Diagnosis not present

## 2016-12-08 MED ORDER — CYANOCOBALAMIN 1000 MCG/ML IJ KIT: 1000.0000 ug | PACK | INTRAMUSCULAR | 12 refills | Status: DC

## 2016-12-08 MED ORDER — CYANOCOBALAMIN 1000 MCG/ML IJ SOLN
1000.0000 ug | Freq: Once | INTRAMUSCULAR | Status: AC
  Administered 2016-12-08: 1000 ug via INTRAMUSCULAR
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
## 2016-12-10 DIAGNOSIS — E538 Deficiency of other specified B group vitamins: Secondary | ICD-10-CM | POA: Diagnosis not present

## 2016-12-10 DIAGNOSIS — E039 Hypothyroidism, unspecified: Secondary | ICD-10-CM | POA: Diagnosis not present

## 2016-12-10 DIAGNOSIS — Z23 Encounter for immunization: Secondary | ICD-10-CM | POA: Diagnosis not present

## 2016-12-10 DIAGNOSIS — I2510 Atherosclerotic heart disease of native coronary artery without angina pectoris: Secondary | ICD-10-CM | POA: Diagnosis not present

## 2016-12-10 DIAGNOSIS — E559 Vitamin D deficiency, unspecified: Secondary | ICD-10-CM | POA: Diagnosis not present

## 2016-12-27 ENCOUNTER — Other Ambulatory Visit (INDEPENDENT_AMBULATORY_CARE_PROVIDER_SITE_OTHER): Payer: Self-pay | Admitting: Cardiovascular Disease

## 2016-12-27 DIAGNOSIS — I10 Essential (primary) hypertension: Secondary | ICD-10-CM

## 2016-12-27 DIAGNOSIS — I2510 Atherosclerotic heart disease of native coronary artery without angina pectoris: Secondary | ICD-10-CM

## 2016-12-27 DIAGNOSIS — E782 Mixed hyperlipidemia: Secondary | ICD-10-CM

## 2016-12-27 DIAGNOSIS — Z955 Presence of coronary angioplasty implant and graft: Secondary | ICD-10-CM

## 2017-01-13 ENCOUNTER — Telehealth (INDEPENDENT_AMBULATORY_CARE_PROVIDER_SITE_OTHER): Payer: Self-pay | Admitting: Cardiovascular Disease

## 2017-01-13 ENCOUNTER — Other Ambulatory Visit (INDEPENDENT_AMBULATORY_CARE_PROVIDER_SITE_OTHER): Payer: Self-pay

## 2017-01-13 DIAGNOSIS — I2510 Atherosclerotic heart disease of native coronary artery without angina pectoris: Secondary | ICD-10-CM

## 2017-01-13 MED ORDER — METOPROLOL TARTRATE 25 MG PO TABS
ORAL_TABLET | ORAL | 3 refills | Status: DC
Start: 2017-01-13 — End: 2021-12-02

## 2017-01-13 MED ORDER — ATORVASTATIN CALCIUM 80 MG PO TABS
ORAL_TABLET | ORAL | 3 refills | Status: AC
Start: 2017-01-13 — End: ?

## 2017-01-13 NOTE — Telephone Encounter (Signed)
Sent refill to physician for approval

## 2017-01-13 NOTE — Telephone Encounter (Signed)
Please call Lipitor and Metoprolol refills to [facility] on file.    Thanks,  cw

## 2017-03-05 ENCOUNTER — Telehealth: Payer: Self-pay | Admitting: Cardiology

## 2017-03-05 ENCOUNTER — Encounter: Payer: Medicare Other | Admitting: *Deleted

## 2017-03-05 NOTE — Telephone Encounter (Addendum)
Confirmed remote transmission w/ pt mother.   

## 2017-05-19 DIAGNOSIS — E039 Hypothyroidism, unspecified: Secondary | ICD-10-CM | POA: Diagnosis not present

## 2017-05-19 DIAGNOSIS — F419 Anxiety disorder, unspecified: Secondary | ICD-10-CM | POA: Diagnosis not present

## 2017-05-19 DIAGNOSIS — E538 Deficiency of other specified B group vitamins: Secondary | ICD-10-CM | POA: Diagnosis not present

## 2017-05-19 DIAGNOSIS — E559 Vitamin D deficiency, unspecified: Secondary | ICD-10-CM | POA: Diagnosis not present

## 2017-05-19 DIAGNOSIS — I2510 Atherosclerotic heart disease of native coronary artery without angina pectoris: Secondary | ICD-10-CM | POA: Diagnosis not present

## 2017-05-25 DIAGNOSIS — E559 Vitamin D deficiency, unspecified: Secondary | ICD-10-CM | POA: Diagnosis not present

## 2017-05-25 DIAGNOSIS — E039 Hypothyroidism, unspecified: Secondary | ICD-10-CM | POA: Diagnosis not present

## 2017-05-25 DIAGNOSIS — N400 Enlarged prostate without lower urinary tract symptoms: Secondary | ICD-10-CM | POA: Diagnosis not present

## 2017-05-25 DIAGNOSIS — F04 Amnestic disorder due to known physiological condition: Secondary | ICD-10-CM | POA: Diagnosis not present

## 2017-06-15 ENCOUNTER — Telehealth: Payer: Self-pay | Admitting: Cardiology

## 2017-06-15 ENCOUNTER — Ambulatory Visit (INDEPENDENT_AMBULATORY_CARE_PROVIDER_SITE_OTHER): Payer: Medicare Other | Admitting: *Deleted

## 2017-06-15 DIAGNOSIS — I4901 Ventricular fibrillation: Secondary | ICD-10-CM

## 2017-06-15 NOTE — Telephone Encounter (Signed)
Confirmed remote transmission w/ pt mother.   

## 2017-06-17 NOTE — Progress Notes (Signed)
Remote ICD transmission.   

## 2017-06-18 ENCOUNTER — Encounter: Payer: Self-pay | Admitting: Cardiology

## 2017-06-22 LAB — CUP PACEART REMOTE DEVICE CHECK
Battery Remaining Longevity: 50 mo
Battery Remaining Percentage: 43 %
Battery Voltage: 2.93 V
Brady Statistic RV Percent Paced: 1 %
Date Time Interrogation Session: 20180620042636
HighPow Impedance: 78 Ohm
HighPow Impedance: 78 Ohm
Implantable Lead Implant Date: 20110912
Implantable Lead Location: 753860
Implantable Lead Model: 7122
Implantable Pulse Generator Implant Date: 20110912
Lead Channel Impedance Value: 400 Ohm
Lead Channel Pacing Threshold Amplitude: 1 V
Lead Channel Pacing Threshold Pulse Width: 0.5 ms
Lead Channel Sensing Intrinsic Amplitude: 10.5 mV
Lead Channel Setting Pacing Amplitude: 2.5 V
Lead Channel Setting Pacing Pulse Width: 0.5 ms
Lead Channel Setting Sensing Sensitivity: 0.5 mV
Pulse Gen Serial Number: 618386

## 2017-07-26 ENCOUNTER — Other Ambulatory Visit: Payer: Self-pay | Admitting: Nurse Practitioner

## 2017-07-26 ENCOUNTER — Other Ambulatory Visit: Payer: Self-pay | Admitting: *Deleted

## 2017-07-26 ENCOUNTER — Encounter: Payer: Self-pay | Admitting: Internal Medicine

## 2017-07-26 ENCOUNTER — Ambulatory Visit (INDEPENDENT_AMBULATORY_CARE_PROVIDER_SITE_OTHER): Payer: Medicare Other | Admitting: Internal Medicine

## 2017-07-26 VITALS — BP 113/67 | HR 58 | Ht 75.0 in | Wt 223.4 lb

## 2017-07-26 DIAGNOSIS — I4901 Ventricular fibrillation: Secondary | ICD-10-CM

## 2017-07-26 DIAGNOSIS — Z95810 Presence of automatic (implantable) cardiac defibrillator: Secondary | ICD-10-CM

## 2017-07-26 MED ORDER — CYANOCOBALAMIN 1000 MCG/ML IJ KIT: 1000.0000 ug | PACK | INTRAMUSCULAR | 12 refills | Status: DC

## 2017-07-26 NOTE — Progress Notes (Signed)
   PCP: Jani Gravel, MD Primary Cardiologist:  Dr Jeneen Rinks is a 50 y.o. male who presents today for routine electrophysiology followup.  Since last being seen in our clinic, the patient reports doing very well.  He is a Fish farm manager.  Continues to go to daycare.  Plays dominoes there.  Today, he denies symptoms of palpitations, chest pain, shortness of breath,  lower extremity edema, dizziness, presyncope, syncope, or ICD shocks.  The patient is otherwise without complaint today.   Past Medical History:  Diagnosis Date  . Anxiety   . Automatic implantable cardiac defibrillator   . CAD (coronary artery disease)    nonobstructive  . 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 Depression   . 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 (doesn't say where LV goes to)   . VF (ventricular fibrillation) ([place])    arrest   Past Surgical History:  Procedure Laterality Date  . ICD implantation     with defibrillation threshold testing. St. Jude    ROS- all systems are reviewed and negative except as per HPI above  Current Outpatient Prescriptions  Medication Sig Dispense Refill  . aspirin 325 MG EC tablet Take 325 mg by mouth daily.    . Cholecalciferol (VITAMIN D-3) 5000 UNITS TABS Take 5,000 Units by mouth daily.     . citalopram (CELEXA) 20 MG tablet Take 1 tablet (20 mg total) by mouth daily.    . Cyanocobalamin 1000 MCG/ML KIT Inject 1,000 mcg as directed every 30 (thirty) days. 1 kit 12  . Divalproex Sodium (DEPAKOTE PO) Take 125 mg by mouth daily.     . folic acid (FOLVITE) 1 MG tablet Take 1 tablet (1 mg total) by mouth daily.    Marland Kitchen levothyroxine (SYNTHROID, LEVOTHROID) 25 MCG tablet Take 37.5 mcg by mouth daily before breakfast.    . LORazepam (ATIVAN) 0.5 MG tablet Take 0.5 mg by mouth daily.     . Multiple Vitamin (MULTIVITAMIN) tablet Take 1 tablet by mouth daily.    . simvastatin (ZOCOR) 5 MG tablet Take 1 tablet (5 mg total) by mouth daily. 30 tablet 11   . terazosin (HYTRIN) 1 MG capsule Take 1 mg by mouth daily.     Marland Kitchen thiamine 100 MG tablet Take 1 tablet (100 mg total) by mouth daily.     No current facility-administered medications for this visit.     Physical Exam: Vitals:   07/26/17 0927  BP: 113/67  Pulse: (!) 58  SpO2: 98%  Weight: 223 lb 6.4 oz (101.3 kg)  Height: '6\' 3"'$  (1.905 m)    GEN- The patient is well appearing, alert with dementia but pleasant Head- normocephalic, atraumatic Eyes-  Sclera clear, conjunctiva pink Ears- hearing intact Oropharynx- clear Lungs- Clear to ausculation bilaterally, normal work of breathing Chest- ICD pocket is well healed Heart- Regular rate and rhythm, no murmurs, rubs or gallops, PMI not laterally displaced GI- soft, NT, ND, + BS Extremities- no clubbing, cyanosis, or edema  ICD interrogation- reviewed in detail today,  See PACEART report  Assessment and Plan:  1.  Coronary spasm, prior VF arrest No ischemic symptoms Stable on an appropriate medical regimen Normal ICD function See [facility] Art report No changes today We again discussed SJM battery advisary today  Merlin Return to see me in a year Follow-up with Dr Burt Knack as scheduled   Thompson Grayer MD, [facility] 07/26/2017 10:09 AM

## 2017-07-26 NOTE — Patient Instructions (Signed)
Medication Instructions:  Your physician recommends that you continue on your current medications as directed. Please refer to the Current Medication list given to you today.   Labwork: None ordered   Testing/Procedures: None ordered   Follow-Up: Your physician wants you to follow-up in: 12 months with Dr. Johney FrameAllred.  You will receive a reminder letter in the mail two months in advance. If you don't receive a letter, please call our office to schedule the follow-up appointment.   Remote monitoring is used to monitor your ICD from home. This monitoring reduces the number of office visits required to check your device to one time per year. It allows us to keep an eye on the functioning of your device to ensure it is working properly. You are scheduled for a device check from home on 09/14/17. You may send your transmission at any time that day. If you have a wireless device, the transmission will be sent automatically. After your physician reviews your transmission, you will receive a postcard with your next transmission date.    Any Other Special Instructions Will Be Listed Below (If Applicable).     If you need a refill on your cardiac medications before your next appointment, please call your pharmacy.

## 2017-07-27 LAB — CUP PACEART INCLINIC DEVICE CHECK
Battery Remaining Longevity: 50 mo
Brady Statistic RV Percent Paced: 0 %
Date Time Interrogation Session: 20180730134050
Implantable Lead Implant Date: 20110912
Implantable Lead Location: 753860
Implantable Lead Model: 7122
Implantable Pulse Generator Implant Date: 20110912
Lead Channel Pacing Threshold Amplitude: 1 V
Lead Channel Pacing Threshold Pulse Width: 0.5 ms
Lead Channel Sensing Intrinsic Amplitude: 9.1 mV
Lead Channel Setting Pacing Amplitude: 2.5 V
Lead Channel Setting Pacing Pulse Width: 0.5 ms
Lead Channel Setting Sensing Sensitivity: 0.5 mV
Pulse Gen Serial Number: 618386

## 2017-08-18 DIAGNOSIS — E039 Hypothyroidism, unspecified: Secondary | ICD-10-CM | POA: Diagnosis not present

## 2017-08-25 DIAGNOSIS — E039 Hypothyroidism, unspecified: Secondary | ICD-10-CM | POA: Diagnosis not present

## 2017-08-25 DIAGNOSIS — E559 Vitamin D deficiency, unspecified: Secondary | ICD-10-CM | POA: Diagnosis not present

## 2017-08-25 DIAGNOSIS — F04 Amnestic disorder due to known physiological condition: Secondary | ICD-10-CM | POA: Diagnosis not present

## 2017-08-25 DIAGNOSIS — N400 Enlarged prostate without lower urinary tract symptoms: Secondary | ICD-10-CM | POA: Diagnosis not present

## 2017-09-04 DIAGNOSIS — W540XXA Bitten by dog, initial encounter: Secondary | ICD-10-CM | POA: Diagnosis not present

## 2017-09-04 DIAGNOSIS — L03113 Cellulitis of right upper limb: Secondary | ICD-10-CM | POA: Diagnosis not present

## 2017-09-04 DIAGNOSIS — Z23 Encounter for immunization: Secondary | ICD-10-CM | POA: Diagnosis not present

## 2017-09-14 ENCOUNTER — Telehealth: Payer: Self-pay | Admitting: Cardiology

## 2017-09-14 ENCOUNTER — Ambulatory Visit (INDEPENDENT_AMBULATORY_CARE_PROVIDER_SITE_OTHER): Payer: Medicare Other | Admitting: *Deleted

## 2017-09-14 DIAGNOSIS — I4901 Ventricular fibrillation: Secondary | ICD-10-CM

## 2017-09-14 DIAGNOSIS — Z95810 Presence of automatic (implantable) cardiac defibrillator: Secondary | ICD-10-CM

## 2017-09-14 NOTE — Telephone Encounter (Signed)
LMOVM reminding pt to send remote transmission.   

## 2017-09-15 NOTE — Progress Notes (Signed)
Remote ICD transmission.   

## 2017-09-16 ENCOUNTER — Encounter: Payer: Self-pay | Admitting: Cardiology

## 2017-09-16 LAB — CUP PACEART REMOTE DEVICE CHECK
Battery Remaining Longevity: 48 mo
Battery Remaining Percentage: 41 %
Battery Voltage: 2.92 V
Brady Statistic RV Percent Paced: 1 %
Date Time Interrogation Session: 20180919015854
HighPow Impedance: 78 Ohm
HighPow Impedance: 78 Ohm
Implantable Lead Implant Date: 20110912
Implantable Lead Location: 753860
Implantable Lead Model: 7122
Implantable Pulse Generator Implant Date: 20110912
Lead Channel Impedance Value: 400 Ohm
Lead Channel Pacing Threshold Amplitude: 1 V
Lead Channel Pacing Threshold Pulse Width: 0.5 ms
Lead Channel Sensing Intrinsic Amplitude: 9 mV
Lead Channel Setting Pacing Amplitude: 2.5 V
Lead Channel Setting Pacing Pulse Width: 0.5 ms
Lead Channel Setting Sensing Sensitivity: 0.5 mV
Pulse Gen Serial Number: 618386

## 2017-10-14 ENCOUNTER — Telehealth: Payer: Self-pay | Admitting: Cardiology

## 2017-10-14 NOTE — Telephone Encounter (Signed)
LMOVM requesting that pt send manual transmission b/c home monitor has not updated in at least 7 days.    

## 2017-11-09 DIAGNOSIS — Z23 Encounter for immunization: Secondary | ICD-10-CM | POA: Diagnosis not present

## 2017-12-08 ENCOUNTER — Ambulatory Visit: Payer: Medicare Other | Admitting: Nurse Practitioner

## 2017-12-09 DIAGNOSIS — E559 Vitamin D deficiency, unspecified: Secondary | ICD-10-CM | POA: Diagnosis not present

## 2017-12-09 DIAGNOSIS — Z0000 Encounter for general adult medical examination without abnormal findings: Secondary | ICD-10-CM | POA: Diagnosis not present

## 2017-12-09 DIAGNOSIS — Z125 Encounter for screening for malignant neoplasm of prostate: Secondary | ICD-10-CM | POA: Diagnosis not present

## 2017-12-09 DIAGNOSIS — I2510 Atherosclerotic heart disease of native coronary artery without angina pectoris: Secondary | ICD-10-CM | POA: Diagnosis not present

## 2017-12-09 DIAGNOSIS — E039 Hypothyroidism, unspecified: Secondary | ICD-10-CM | POA: Diagnosis not present

## 2017-12-09 DIAGNOSIS — E538 Deficiency of other specified B group vitamins: Secondary | ICD-10-CM | POA: Diagnosis not present

## 2017-12-13 ENCOUNTER — Ambulatory Visit (INDEPENDENT_AMBULATORY_CARE_PROVIDER_SITE_OTHER): Payer: Medicare Other | Admitting: Cardiovascular Disease

## 2017-12-13 ENCOUNTER — Encounter: Payer: Self-pay | Admitting: Cardiovascular Disease

## 2017-12-13 VITALS — BP 114/74 | HR 62 | Ht 75.0 in | Wt 219.4 lb

## 2017-12-13 DIAGNOSIS — I4901 Ventricular fibrillation: Secondary | ICD-10-CM

## 2017-12-13 DIAGNOSIS — I201 Angina pectoris with documented spasm: Secondary | ICD-10-CM

## 2017-12-13 DIAGNOSIS — E785 Hyperlipidemia, unspecified: Secondary | ICD-10-CM

## 2017-12-13 MED ORDER — ASPIRIN EC 81 MG PO TBEC: 81.0000 mg | DELAYED_RELEASE_TABLET | Freq: Every day | ORAL | Status: DC

## 2017-12-13 NOTE — Progress Notes (Signed)
Cardiology Office Note Date:  12/13/2017   ID:  Adam Orr, DOB Feb 24, 1966, MRN [id number]  PCP:  Adam Gravel, MD  Cardiologist:  Adam Mocha, MD    Chief Complaint  Patient presents with  . Follow-up    ventricular fibrillation     History of Present Illness: Adam Orr is a 51 y.o. male who presents for follow-up evaluation.  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  He has undergone ICD implantation and he is followed regularly by the EP service.  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  He's here with his mother today. Continues to attend an adult day center five days/week.  He denies any cardiac-related symptoms.  He specifically denies chest pain, shortness of breath, leg swelling, heart palpitations, orthopnea, PND, or lightheadedness.  His mother says that he has to stop and rest at times with any prolonged walking.  Otherwise no issues reported.  He has had no ICD discharges.   Past Medical History:  Diagnosis Date  . Anxiety   . Automatic implantable cardiac defibrillator   . CAD (coronary artery disease)    nonobstructive  . 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 Depression   . 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 (doesn't say where LV goes to)   . VF (ventricular fibrillation) ([place])    arrest    Past Surgical History:  Procedure Laterality Date  . ICD implantation     with defibrillation threshold testing. St. Jude    Current Outpatient Medications  Medication Sig Dispense Refill  . aspirin 81 MG tablet Take 1 tablet (81 mg total) by mouth daily.    . Cholecalciferol (VITAMIN D-3) 5000 UNITS TABS Take 5,000 Units by mouth daily.     . citalopram (CELEXA) 20 MG tablet Take 1 tablet (20 mg total) by mouth daily.    . Cyanocobalamin 1000 MCG/ML KIT Inject 1,000 mcg as directed every 30 (thirty) days.  1 kit 12  . Divalproex Sodium (DEPAKOTE PO) Take 125 mg by mouth daily.     . folic acid (FOLVITE) 1 MG tablet Take 1 tablet (1 mg total) by mouth daily.    Marland Kitchen levothyroxine (SYNTHROID, LEVOTHROID) 50 MCG tablet Take 50 mcg by mouth daily before breakfast.    . LORazepam (ATIVAN) 0.5 MG tablet Take 0.5 mg by mouth daily.     . Multiple Vitamin (MULTIVITAMIN) tablet Take 1 tablet by mouth daily.    . simvastatin (ZOCOR) 5 MG tablet Take 1 tablet (5 mg total) by mouth daily. 30 tablet 11  . terazosin (HYTRIN) 1 MG capsule Take 1 mg by mouth daily.     Marland Kitchen thiamine 100 MG tablet Take 1 tablet (100 mg total) by mouth daily.     No current facility-administered medications for this visit.     Allergies:   Penicillins; Mirtazapine; Smallpox vaccine; Namenda [memantine hcl]; and Melatonin   Social History:  The patient  reports that he has quit smoking. His smoking use included cigarettes. He started smoking about 7 years ago. he has never used smokeless tobacco. He reports that he drinks about 50.4 oz of alcohol per week. He reports that he does not use drugs.   Family History:  The patient's family history includes Coronary artery disease in his mother; Prostate cancer in his father and paternal grandfather.    ROS:  Please  see the history of present illness.  Otherwise, review of systems is positive for memory loss, fatigue.  All other systems are reviewed and negative.    PHYSICAL EXAM: VS:  BP 114/74   Pulse 62   Ht '6\' 3"'$  (1.905 m)   Wt 219 lb 6.4 oz (99.5 kg)   BMI 27.42 kg/m  , BMI Body mass index is 27.42 kg/m. GEN: Well nourished, well developed, in no acute distress  HEENT: normal  Neck: no JVD, no masses. No carotid bruits Cardiac: RRR without murmur or gallop     Respiratory:  clear to auscultation bilaterally, normal work of breathing GI: soft, nontender, nondistended, + BS MS: no deformity or atrophy  Ext: no pretibial edema, pedal pulses 2+= bilaterally Skin: warm and dry,  no rash Neuro:  Strength and sensation are intact Psych: euthymic mood, full affect  EKG:  EKG is ordered today. The ekg ordered today shows NSR, within normal limits  Recent Labs: No results found for requested labs within last 8760 hours.   Lipid Panel     Component Value Date/Time   CHOL 203 (H) 11/24/2010 2142   TRIG 195 (H) 11/24/2010 2142   HDL 54 11/24/2010 2142   CHOLHDL 3.8 Ratio 11/24/2010 2142   VLDL 39 11/24/2010 2142   LDLCALC 110 (H) 11/24/2010 2142      Wt Readings from Last 3 Encounters:  12/13/17 219 lb 6.4 oz (99.5 kg)  07/26/17 223 lb 6.4 oz (101.3 kg)  12/08/16 214 lb (97.1 kg)    ASSESSMENT AND PLAN: 1.  Coronary artery disease, native vessel, with Prinzmetal angina: medications reviewed. Will decrease ASA to 81 mg daily. Otherwise no changes made today.  2.  History of ventricular fibrillation cardiac arrest: Continue follow-up with the EP service.  No recent ICD discharges.  3.  Hyperlipidemia: Followed by primary care.  Treated with simvastatin.  Current medicines are reviewed with the patient today.  The patient does not have concerns regarding medicines.  Labs/ tests ordered today include:   Orders Placed This Encounter  Procedures  . EKG 12-Lead    Disposition:   FU one year  Signed, Adam Mocha, MD  12/13/2017 8:36 AM    [address], [place], [place]  [address] Phone: [phone]; Fax: [phone]

## 2017-12-13 NOTE — Patient Instructions (Signed)
Medication Instructions:  1) DECREASE ASPRIN to 81 mg daily  Labwork: None  Testing/Procedures: None  Follow-Up: Your provider wants you to follow-up in: 1 year with Dr. Excell Seltzerooper. You will receive a reminder letter in the mail two months in advance. If you don't receive a letter, please call our office to schedule the follow-up appointment.    Any Other Special Instructions Will Be Listed Below (If Applicable).     If you need a refill on your cardiac medications before your next appointment, please call your pharmacy.

## 2017-12-14 ENCOUNTER — Ambulatory Visit (INDEPENDENT_AMBULATORY_CARE_PROVIDER_SITE_OTHER): Payer: Medicare Other | Admitting: *Deleted

## 2017-12-14 ENCOUNTER — Telehealth: Payer: Self-pay | Admitting: Cardiology

## 2017-12-14 DIAGNOSIS — I4901 Ventricular fibrillation: Secondary | ICD-10-CM

## 2017-12-14 NOTE — Telephone Encounter (Signed)
LMOVM reminding pt to send remote transmission.   

## 2017-12-15 ENCOUNTER — Encounter: Payer: Self-pay | Admitting: Cardiology

## 2017-12-15 NOTE — Progress Notes (Signed)
Remote ICD transmission.   

## 2017-12-16 DIAGNOSIS — E538 Deficiency of other specified B group vitamins: Secondary | ICD-10-CM | POA: Diagnosis not present

## 2017-12-16 DIAGNOSIS — I2510 Atherosclerotic heart disease of native coronary artery without angina pectoris: Secondary | ICD-10-CM | POA: Diagnosis not present

## 2017-12-16 DIAGNOSIS — E559 Vitamin D deficiency, unspecified: Secondary | ICD-10-CM | POA: Diagnosis not present

## 2017-12-16 DIAGNOSIS — E039 Hypothyroidism, unspecified: Secondary | ICD-10-CM | POA: Diagnosis not present

## 2017-12-16 DIAGNOSIS — Z0000 Encounter for general adult medical examination without abnormal findings: Secondary | ICD-10-CM | POA: Diagnosis not present

## 2017-12-16 DIAGNOSIS — F419 Anxiety disorder, unspecified: Secondary | ICD-10-CM | POA: Diagnosis not present

## 2017-12-17 LAB — CUP PACEART REMOTE DEVICE CHECK
Battery Remaining Longevity: 47 mo
Battery Remaining Percentage: 40 %
Battery Voltage: 2.92 V
Brady Statistic RV Percent Paced: 1 %
Date Time Interrogation Session: 20181219042629
HighPow Impedance: 74 Ohm
HighPow Impedance: 74 Ohm
Implantable Lead Implant Date: 20110912
Implantable Lead Location: 753860
Implantable Lead Model: 7122
Implantable Pulse Generator Implant Date: 20110912
Lead Channel Impedance Value: 390 Ohm
Lead Channel Pacing Threshold Amplitude: 1 V
Lead Channel Pacing Threshold Pulse Width: 0.5 ms
Lead Channel Sensing Intrinsic Amplitude: 8 mV
Lead Channel Setting Pacing Amplitude: 2.5 V
Lead Channel Setting Pacing Pulse Width: 0.5 ms
Lead Channel Setting Sensing Sensitivity: 0.5 mV
Pulse Gen Serial Number: 618386
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
## 2018-01-18 ENCOUNTER — Encounter: Payer: Self-pay | Admitting: Nurse Practitioner

## 2018-01-18 ENCOUNTER — Ambulatory Visit (INDEPENDENT_AMBULATORY_CARE_PROVIDER_SITE_OTHER): Payer: Medicare Other | Admitting: Nurse Practitioner

## 2018-01-18 VITALS — BP 118/70 | HR 75 | Wt 225.0 lb

## 2018-01-18 DIAGNOSIS — E519 Thiamine deficiency, unspecified: Secondary | ICD-10-CM

## 2018-01-18 DIAGNOSIS — R413 Other amnesia: Secondary | ICD-10-CM | POA: Diagnosis not present

## 2018-01-18 DIAGNOSIS — E538 Deficiency of other specified B group vitamins: Secondary | ICD-10-CM | POA: Insufficient documentation

## 2018-01-18 NOTE — Patient Instructions (Addendum)
Continue vitamin B12 monthly Check with Dr. Selena BattenKim about most recent vitamin B12 level Regular exercise Follow-up in 6-8 months

## 2018-01-20 NOTE — Progress Notes (Signed)
I have reviewed and agreed above plan. 

## 2018-03-15 ENCOUNTER — Ambulatory Visit (INDEPENDENT_AMBULATORY_CARE_PROVIDER_SITE_OTHER): Payer: Medicare Other | Admitting: *Deleted

## 2018-03-15 DIAGNOSIS — I4901 Ventricular fibrillation: Secondary | ICD-10-CM | POA: Diagnosis not present

## 2018-03-15 NOTE — Progress Notes (Signed)
Remote ICD transmission.   

## 2018-03-16 ENCOUNTER — Encounter: Payer: Self-pay | Admitting: Cardiology

## 2018-03-18 LAB — CUP PACEART REMOTE DEVICE CHECK
Battery Remaining Longevity: 44 mo
Battery Remaining Percentage: 38 %
Battery Voltage: 2.92 V
Brady Statistic RV Percent Paced: 1 %
Date Time Interrogation Session: 20190319085444
HighPow Impedance: 78 Ohm
HighPow Impedance: 78 Ohm
Implantable Lead Implant Date: 20110912
Implantable Lead Location: 753860
Implantable Lead Model: 7122
Implantable Pulse Generator Implant Date: 20110912
Lead Channel Impedance Value: 390 Ohm
Lead Channel Pacing Threshold Amplitude: 1 V
Lead Channel Pacing Threshold Pulse Width: 0.5 ms
Lead Channel Sensing Intrinsic Amplitude: 10.9 mV
Lead Channel Setting Pacing Amplitude: 2.5 V
Lead Channel Setting Pacing Pulse Width: 0.5 ms
Lead Channel Setting Sensing Sensitivity: 0.5 mV
Pulse Gen Serial Number: 618386

## 2018-06-14 ENCOUNTER — Ambulatory Visit (INDEPENDENT_AMBULATORY_CARE_PROVIDER_SITE_OTHER): Payer: Medicare Other | Admitting: *Deleted

## 2018-06-14 ENCOUNTER — Telehealth: Payer: Self-pay | Admitting: Cardiology

## 2018-06-14 DIAGNOSIS — I4901 Ventricular fibrillation: Secondary | ICD-10-CM | POA: Diagnosis not present

## 2018-06-14 NOTE — Telephone Encounter (Signed)
Confirmed remote transmission w/ pt mother.   

## 2018-06-15 ENCOUNTER — Encounter: Payer: Self-pay | Admitting: Cardiology

## 2018-06-15 NOTE — Progress Notes (Signed)
Remote ICD transmission.   

## 2018-07-02 LAB — CUP PACEART REMOTE DEVICE CHECK
Battery Remaining Longevity: 42 mo
Battery Remaining Percentage: 36 %
Battery Voltage: 2.9 V
Brady Statistic RV Percent Paced: 1 %
Date Time Interrogation Session: 20190619052719
HighPow Impedance: 79 Ohm
HighPow Impedance: 79 Ohm
Implantable Lead Implant Date: 20110912
Implantable Lead Location: 753860
Implantable Lead Model: 7122
Implantable Pulse Generator Implant Date: 20110912
Lead Channel Impedance Value: 360 Ohm
Lead Channel Pacing Threshold Amplitude: 1 V
Lead Channel Pacing Threshold Pulse Width: 0.5 ms
Lead Channel Sensing Intrinsic Amplitude: 8.4 mV
Lead Channel Setting Pacing Amplitude: 2.5 V
Lead Channel Setting Pacing Pulse Width: 0.5 ms
Lead Channel Setting Sensing Sensitivity: 0.5 mV
Pulse Gen Serial Number: 618386

## 2018-07-18 ENCOUNTER — Ambulatory Visit: Payer: Medicare Other | Admitting: Nurse Practitioner

## 2018-07-20 ENCOUNTER — Telehealth: Payer: Self-pay | Admitting: Cardiology

## 2018-07-20 DIAGNOSIS — I2510 Atherosclerotic heart disease of native coronary artery without angina pectoris: Secondary | ICD-10-CM | POA: Diagnosis not present

## 2018-07-20 NOTE — Telephone Encounter (Signed)
Spoke w/ pt son and requested that he send a manual transmission b/c his home monitor has not updated in at least 7 days.

## 2018-07-27 ENCOUNTER — Ambulatory Visit (INDEPENDENT_AMBULATORY_CARE_PROVIDER_SITE_OTHER): Payer: Medicare Other | Admitting: Internal Medicine

## 2018-07-27 ENCOUNTER — Encounter (INDEPENDENT_AMBULATORY_CARE_PROVIDER_SITE_OTHER): Payer: Self-pay

## 2018-07-27 ENCOUNTER — Encounter: Payer: Self-pay | Admitting: Internal Medicine

## 2018-07-27 VITALS — BP 112/80 | HR 51 | Ht 75.0 in | Wt 220.8 lb

## 2018-07-27 DIAGNOSIS — E559 Vitamin D deficiency, unspecified: Secondary | ICD-10-CM | POA: Diagnosis not present

## 2018-07-27 DIAGNOSIS — I4901 Ventricular fibrillation: Secondary | ICD-10-CM

## 2018-07-27 DIAGNOSIS — E039 Hypothyroidism, unspecified: Secondary | ICD-10-CM | POA: Diagnosis not present

## 2018-07-27 DIAGNOSIS — E538 Deficiency of other specified B group vitamins: Secondary | ICD-10-CM | POA: Diagnosis not present

## 2018-07-27 DIAGNOSIS — Z0000 Encounter for general adult medical examination without abnormal findings: Secondary | ICD-10-CM | POA: Diagnosis not present

## 2018-07-27 DIAGNOSIS — I2510 Atherosclerotic heart disease of native coronary artery without angina pectoris: Secondary | ICD-10-CM | POA: Diagnosis not present

## 2018-07-27 NOTE — Progress Notes (Signed)
PCP: Jani Gravel, MD Primary Cardiologist: Dr Burt Knack Primary EP: Dr Rayann Heman  Adam Orr is a 52 y.o. male who presents today for routine electrophysiology followup.  Since last being seen in our clinic, the patient reports doing very well.  His profound memory deficit is unchanged.  He continues to go to daycare.  He is again accompanied by his mother today. Today, he denies symptoms of palpitations, chest pain, shortness of breath,  lower extremity edema, dizziness, presyncope, syncope, or ICD shocks.  The patient is otherwise without complaint today.   Past Medical History:  Diagnosis Date  . Anxiety   . Automatic implantable cardiac defibrillator   . CAD (coronary artery disease)    nonobstructive  . 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 Depression   . 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 (doesn't say where LV goes to)   . VF (ventricular fibrillation) ([place])    arrest   Past Surgical History:  Procedure Laterality Date  . ICD implantation     with defibrillation threshold testing. St. Jude    ROS- all systems are reviewed and negative except as per HPI above  Current Outpatient Medications  Medication Sig Dispense Refill  . aspirin 81 MG chewable tablet Chew 81 mg by mouth daily.    . Cholecalciferol (VITAMIN D-3) 5000 UNITS TABS Take 5,000 Units by mouth daily.     . citalopram (CELEXA) 20 MG tablet Take 1 tablet (20 mg total) by mouth daily.    . Cyanocobalamin 1000 MCG/ML KIT Inject 1,000 mcg as directed every 30 (thirty) days. 1 kit 12  . Divalproex Sodium (DEPAKOTE PO) Take 125 mg by mouth daily.     . folic acid (FOLVITE) 1 MG tablet Take 1 tablet (1 mg total) by mouth daily.    Marland Kitchen levothyroxine (SYNTHROID, LEVOTHROID) 50 MCG tablet Take 50 mcg by mouth daily before breakfast.    . LORazepam (ATIVAN) 0.5 MG tablet Take 0.5 mg by mouth daily.     . Multiple Vitamin (MULTIVITAMIN) tablet Take 1 tablet by mouth daily.    . simvastatin  (ZOCOR) 5 MG tablet Take 1 tablet (5 mg total) by mouth daily. 30 tablet 11  . terazosin (HYTRIN) 1 MG capsule Take 1 mg by mouth daily.     Marland Kitchen thiamine 100 MG tablet Take 1 tablet (100 mg total) by mouth daily.     No current facility-administered medications for this visit.     Physical Exam: Vitals:   07/27/18 1008  BP: 112/80  Pulse: (!) 51  SpO2: 98%  Weight: 220 lb 12.8 oz (100.2 kg)  Height: '6\' 3"'$  (1.905 m)    GEN- The patient is well appearing, alert, + severe memory impairment Head- normocephalic, atraumatic Eyes-  Sclera clear, conjunctiva pink Ears- hearing intact Oropharynx- clear Lungs- Clear to ausculation bilaterally, normal work of breathing Chest- ICD pocket is well healed Heart- Regular rate and rhythm, no murmurs, rubs or gallops, PMI not laterally displaced GI- soft, NT, ND, + BS Extremities- no clubbing, cyanosis, or edema  ICD interrogation- reviewed in detail today,  See PACEART report  ekg tracing ordered today is personally reviewed and shows sinus bradycardia  Wt Readings from Last 3 Encounters:  07/27/18 220 lb 12.8 oz (100.2 kg)  01/18/18 225 lb (102.1 kg)  12/13/17 219 lb 6.4 oz (99.5 kg)    Assessment and Plan:  1.  Coronary vasospasm, prior VF Stable on an appropriate  medical regimen Normal ICD function See Pace Art report No changes today  Merlin Return to see EP PA in a year Follow-up with Dr Burt Knack as scheduled   Thompson Grayer MD, [facility] 07/27/2018 10:41 AM

## 2018-07-27 NOTE — Patient Instructions (Signed)
Medication Instructions:  Your physician recommends that you continue on your current medications as directed. Please refer to the Current Medication list given to you today.  *If you need a refill on your cardiac medications before your next appointment, please call your pharmacy*  Labwork: None ordered  Testing/Procedures: None ordered  Follow-Up: Remote monitoring is used to monitor your Pacemaker or ICD from home. This monitoring reduces the number of office visits required to check your device to one time per year. It allows us to keep an eye on the functioning of your device to ensure it is working properly. You are scheduled for a device check from home on 09/13/2018. You may send your transmission at any time that day. If you have a wireless device, the transmission will be sent automatically. After your physician reviews your transmission, you will receive a postcard with your next transmission date.  Your physician wants you to follow-up in: 1 year with Gypsy BalsamAmber Seiler, NP.  You will receive a reminder letter in the mail two months in advance. If you don't receive a letter, please call our office to schedule the follow-up appointment.  Thank you for choosing [facility]!!

## 2018-07-28 LAB — CUP PACEART INCLINIC DEVICE CHECK
Battery Remaining Longevity: 42 mo
Brady Statistic RV Percent Paced: 0 %
Date Time Interrogation Session: 20190731141221
HighPow Impedance: 77.625
Implantable Lead Implant Date: 20110912
Implantable Lead Location: 753860
Implantable Lead Model: 7122
Implantable Pulse Generator Implant Date: 20110912
Lead Channel Impedance Value: 375 Ohm
Lead Channel Pacing Threshold Amplitude: 1 V
Lead Channel Pacing Threshold Amplitude: 1 V
Lead Channel Pacing Threshold Pulse Width: 0.5 ms
Lead Channel Pacing Threshold Pulse Width: 0.5 ms
Lead Channel Sensing Intrinsic Amplitude: 7.9 mV
Lead Channel Setting Pacing Amplitude: 2.5 V
Lead Channel Setting Pacing Pulse Width: 0.5 ms
Lead Channel Setting Sensing Sensitivity: 0.5 mV
Pulse Gen Serial Number: 618386

## 2018-08-18 ENCOUNTER — Other Ambulatory Visit: Payer: Self-pay | Admitting: Neurology

## 2018-09-13 ENCOUNTER — Telehealth: Payer: Self-pay | Admitting: Cardiology

## 2018-09-13 ENCOUNTER — Ambulatory Visit (INDEPENDENT_AMBULATORY_CARE_PROVIDER_SITE_OTHER): Payer: Medicare Other | Admitting: *Deleted

## 2018-09-13 DIAGNOSIS — I4901 Ventricular fibrillation: Secondary | ICD-10-CM

## 2018-09-13 NOTE — Telephone Encounter (Signed)
LMOVM reminding pt to send remote transmission.   

## 2018-09-14 NOTE — Progress Notes (Signed)
Remote ICD transmission.   
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
## 2018-09-16 ENCOUNTER — Encounter: Payer: Self-pay | Admitting: Nurse Practitioner

## 2018-09-16 ENCOUNTER — Ambulatory Visit (INDEPENDENT_AMBULATORY_CARE_PROVIDER_SITE_OTHER): Payer: Medicare Other | Admitting: Nurse Practitioner

## 2018-09-16 VITALS — BP 108/68 | HR 55 | Ht 75.0 in | Wt 217.4 lb

## 2018-09-16 DIAGNOSIS — E519 Thiamine deficiency, unspecified: Secondary | ICD-10-CM

## 2018-09-16 DIAGNOSIS — Z23 Encounter for immunization: Secondary | ICD-10-CM | POA: Diagnosis not present

## 2018-09-16 DIAGNOSIS — Z8639 Personal history of other endocrine, nutritional and metabolic disease: Secondary | ICD-10-CM | POA: Insufficient documentation

## 2018-09-16 DIAGNOSIS — E538 Deficiency of other specified B group vitamins: Secondary | ICD-10-CM | POA: Diagnosis not present

## 2018-09-16 DIAGNOSIS — R413 Other amnesia: Secondary | ICD-10-CM | POA: Diagnosis not present

## 2018-09-16 DIAGNOSIS — Z8669 Personal history of other diseases of the nervous system and sense organs: Secondary | ICD-10-CM | POA: Diagnosis not present

## 2018-09-16 NOTE — Patient Instructions (Signed)
Continue vitamin B12 monthly Continue thiamine by mouth daily Continue aspirin and Zocor Follow-up in 1 year

## 2018-09-16 NOTE — Progress Notes (Signed)
I have reviewed and agreed above plan. 

## 2018-10-03 DIAGNOSIS — H1789 Other corneal scars and opacities: Secondary | ICD-10-CM | POA: Diagnosis not present

## 2018-10-13 LAB — CUP PACEART REMOTE DEVICE CHECK
Battery Remaining Longevity: 40 mo
Battery Remaining Percentage: 34 %
Battery Voltage: 2.9 V
Brady Statistic RV Percent Paced: 1 %
Date Time Interrogation Session: 20190918075518
HighPow Impedance: 74 Ohm
HighPow Impedance: 74 Ohm
Implantable Lead Implant Date: 20110912
Implantable Lead Location: 753860
Implantable Lead Model: 7122
Implantable Pulse Generator Implant Date: 20110912
Lead Channel Impedance Value: 380 Ohm
Lead Channel Pacing Threshold Amplitude: 1 V
Lead Channel Pacing Threshold Pulse Width: 0.5 ms
Lead Channel Sensing Intrinsic Amplitude: 7.7 mV
Lead Channel Setting Pacing Amplitude: 2.5 V
Lead Channel Setting Pacing Pulse Width: 0.5 ms
Lead Channel Setting Sensing Sensitivity: 0.5 mV
Pulse Gen Serial Number: 618386

## 2018-11-30 ENCOUNTER — Encounter: Payer: Self-pay | Admitting: Cardiovascular Disease

## 2018-11-30 ENCOUNTER — Ambulatory Visit (INDEPENDENT_AMBULATORY_CARE_PROVIDER_SITE_OTHER): Payer: Medicare Other | Admitting: Cardiovascular Disease

## 2018-11-30 VITALS — BP 114/76 | HR 70 | Ht 75.0 in | Wt 217.4 lb

## 2018-11-30 DIAGNOSIS — I201 Angina pectoris with documented spasm: Secondary | ICD-10-CM

## 2018-11-30 DIAGNOSIS — E782 Mixed hyperlipidemia: Secondary | ICD-10-CM | POA: Diagnosis not present

## 2018-11-30 NOTE — Patient Instructions (Signed)
Medication Instructions:  No change If you need a refill on your cardiac medications before your next appointment, please call your pharmacy.   Lab work: none If you have labs (blood work) drawn today and your tests are completely normal, you will receive your results only by: . MyChart Message (if you have MyChart) OR . A paper copy in the mail If you have any lab test that is abnormal or we need to change your treatment, we will call you to review the results.  Testing/Procedures: none  Follow-Up: At [facility], you and your health needs are our priority.  As part of our continuing mission to provide you with exceptional heart care, we have created designated Provider Care Teams.  These Care Teams include your primary Cardiologist (physician) and Advanced Practice Providers (APPs -  Physician Assistants and Nurse Practitioners) who all work together to provide you with the care you need, when you need it. You will need a follow up appointment in:  12 months.  Please call our office 2 months in advance to schedule this appointment.  You may see Michael Cooper, MD or one of the following Advanced Practice Providers on your designated Care Team: Scott Weaver, PA-C Vin Bhagat, PA-C . Janine Hammond, NP  Any Other Special Instructions Will Be Listed Below (If Applicable).    

## 2018-11-30 NOTE — Progress Notes (Signed)
Cardiology Office Note:    Date:  11/30/2018   ID:  Adam Orr, DOB 12/14/1966, MRN [id number]  PCP:  Pearson Grippe, MD  Cardiologist:  Tonny Bollman, MD  Electrophysiologist:  Hillis Range, MD   Referring MD: Pearson Grippe, MD   Chief Complaint  Patient presents with  . Coronary Artery Disease    History of Present Illness:    Adam Orr is a 52 y.o. male with a hx of Prinzmetal angina, presenting for follow-up evaluation.  The patient initially presented with Prinzmetal angina and aborted sudden cardiac death after ventricular fibrillation arrest.  He underwent ICD implantation.  He has developed Warnicke's encephalopathy with significant memory impairment.  The patient is here with his mother today.  He has no complaints today.  He specifically denies chest pain, chest pressure, shortness of breath, leg swelling, heart palpitations, orthopnea, or PND.  He denies any ICD discharges since his last visit 1 year ago.  He is compliant with his medications.  Past Medical History:  Diagnosis Date  . Anxiety   . Automatic implantable cardiac defibrillator   . CAD (coronary artery disease)    nonobstructive  . 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 Depression   . 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 (doesn't say where LV goes to)   . VF (ventricular fibrillation) (HCC)    arrest    Past Surgical History:  Procedure Laterality Date  . ICD implantation     with defibrillation threshold testing. [employer]    Current Medications: Current Meds  Medication Sig  . aspirin 81 MG chewable tablet Chew 81 mg by mouth daily.  . Cholecalciferol (VITAMIN D-3) 5000 UNITS TABS Take 5,000 Units by mouth daily.   . citalopram (CELEXA) 20 MG tablet Take 1 tablet (20 mg total) by mouth daily.  . cyanocobalamin (,VITAMIN B-12,) 1000 MCG/ML injection INJECT 1000 MCG AS DIRECTED EVERY 30 DAYS  . Divalproex Sodium (DEPAKOTE PO) Take 125 mg by mouth daily.   . folic  acid (FOLVITE) 1 MG tablet Take 1 tablet (1 mg total) by mouth daily.  Marland Kitchen levothyroxine (SYNTHROID, LEVOTHROID) 50 MCG tablet Take 50 mcg by mouth daily before breakfast.  . LORazepam (ATIVAN) 0.5 MG tablet Take 0.5 mg by mouth daily.   . Multiple Vitamin (MULTIVITAMIN) tablet Take 1 tablet by mouth daily.  . simvastatin (ZOCOR) 5 MG tablet Take 1 tablet (5 mg total) by mouth daily.  Marland Kitchen terazosin (HYTRIN) 1 MG capsule Take 1 mg by mouth daily.   . Thiamine HCl (VITAMIN B-1) 250 MG tablet Take 250 mg by mouth daily.     Allergies:   Penicillins; Mirtazapine; Smallpox vaccine; Namenda [memantine hcl]; and Melatonin   Social History   Socioeconomic History  . Marital status: Divorced    Spouse name: Not on file  . Number of children: 2  . Years of education: Not on file  . 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 Financial resource strain: Not on file  . Food insecurity:    Worry: Not on file    Inability: Not on file  . Transportation needs:    Medical: Not on file    Non-medical: Not on file  Tobacco Use  . Smoking status: Former Smoker    Types: Cigarettes    Start  date: 01/23/2010  . Smokeless tobacco: Never Used  . Tobacco comment: he continues to smoke occasionally, smoked for more than 20 years.   Substance and Sexual Activity  . Alcohol use: Yes    Alcohol/week: 84.0 standard drinks    Types: 84 Cans of beer per week  . Drug use: No  . Sexual activity: Not on file  Lifestyle  . Physical activity:    Days per week: Not on file    Minutes per session: Not on file  . Stress: Not on file  Relationships  . Social connections:    Talks on phone: Not on file    Gets together: Not on file    Attends religious service: Not on file    Active member of club or organization: Not on file    Attends meetings of clubs or organizations: Not on file    Relationship  status: Not on file  Other Topics Concern  . Not on file  Social History Narrative   Divorced, 2 children.    Patient lives at home with his mother Adam Orr.   Patient was in [employer] for 12 years.    Left-handed.     Family History: The patient's family history includes Coronary artery disease in his mother; Prostate cancer in his father and paternal grandfather.  ROS:   Please see the history of present illness.    All other systems reviewed and are negative.  EKGs/Labs/Other Studies Reviewed:    EKG:  EKG is not ordered today.    Recent Labs: No results found for requested labs within last 8760 hours.  Recent Lipid Panel    Component Value Date/Time   CHOL 203 (H) 11/24/2010 2142   TRIG 195 (H) 11/24/2010 2142   HDL 54 11/24/2010 2142   CHOLHDL 3.8 Ratio 11/24/2010 2142   VLDL 39 11/24/2010 2142   LDLCALC 110 (H) 11/24/2010 2142    Physical Exam:    VS:  BP 114/76   Pulse 70   Ht 6\' 3"  (1.905 m)   Wt 217 lb 6.4 oz (98.6 kg)   SpO2 97%   BMI 27.17 kg/m     Wt Readings from Last 3 Encounters:  11/30/18 217 lb 6.4 oz (98.6 kg)  09/16/18 217 lb 6.4 oz (98.6 kg)  07/27/18 220 lb 12.8 oz (100.2 kg)     GEN:  Well nourished, well developed in no acute distress HEENT: Normal NECK: No JVD; No carotid bruits LYMPHATICS: No lymphadenopathy CARDIAC: RRR, no murmurs, rubs, gallops RESPIRATORY:  Clear to auscultation without rales, wheezing or rhonchi  ABDOMEN: Soft, non-tender, non-distended MUSCULOSKELETAL:  No edema; No deformity  SKIN: Warm and dry NEUROLOGIC:  Alert and oriented x 3 PSYCHIATRIC:  Normal affect   ASSESSMENT:    1. Prinzmetal angina (HCC)   2. Mixed hyperlipidemia    PLAN:    In order of problems listed above:  1. Patient remains asymptomatic on aspirin for antiplatelet therapy.  He has discontinued his long-acting nitrate.  We will continue to follow him clinically.  I will see him back in 1 year. 2. Lipids are reviewed and he  continues on a statin drug with simvastatin.  For follow-up I will see the patient back in 1 year unless problems arise.  Medication Adjustments/Labs and Tests Ordered: Current medicines are reviewed at length with the patient today.  Concerns regarding medicines are outlined above.  No orders of the defined types were placed in this encounter.  No orders  of the defined types were placed in this encounter.   There are no Patient Instructions on file for this visit.   Signed, Tonny BollmanMichael Tivon Lemoine, MD  11/30/2018 4:10 PM    [facility]

## 2018-12-14 ENCOUNTER — Encounter: Payer: Self-pay | Admitting: Cardiology

## 2018-12-19 ENCOUNTER — Ambulatory Visit (INDEPENDENT_AMBULATORY_CARE_PROVIDER_SITE_OTHER): Payer: Medicare Other

## 2018-12-19 DIAGNOSIS — I4901 Ventricular fibrillation: Secondary | ICD-10-CM

## 2018-12-20 NOTE — Progress Notes (Signed)
Remote ICD transmission.   

## 2018-12-21 LAB — CUP PACEART REMOTE DEVICE CHECK
Battery Remaining Longevity: 40 mo
Battery Remaining Percentage: 34 %
Battery Voltage: 2.87 V
Brady Statistic RV Percent Paced: 1 %
Date Time Interrogation Session: 20191222062320
HighPow Impedance: 75 Ohm
HighPow Impedance: 75 Ohm
Implantable Lead Implant Date: 20110912
Implantable Lead Location: 753860
Implantable Lead Model: 7122
Implantable Pulse Generator Implant Date: 20110912
Lead Channel Impedance Value: 360 Ohm
Lead Channel Pacing Threshold Amplitude: 1 V
Lead Channel Pacing Threshold Pulse Width: 0.5 ms
Lead Channel Sensing Intrinsic Amplitude: 6.8 mV
Lead Channel Setting Pacing Amplitude: 2.5 V
Lead Channel Setting Pacing Pulse Width: 0.5 ms
Lead Channel Setting Sensing Sensitivity: 0.5 mV
Pulse Gen Serial Number: 618386

## 2019-01-06 DIAGNOSIS — E538 Deficiency of other specified B group vitamins: Secondary | ICD-10-CM | POA: Diagnosis not present

## 2019-01-06 DIAGNOSIS — E559 Vitamin D deficiency, unspecified: Secondary | ICD-10-CM | POA: Diagnosis not present

## 2019-01-06 DIAGNOSIS — E039 Hypothyroidism, unspecified: Secondary | ICD-10-CM | POA: Diagnosis not present

## 2019-01-06 DIAGNOSIS — Z125 Encounter for screening for malignant neoplasm of prostate: Secondary | ICD-10-CM | POA: Diagnosis not present

## 2019-01-06 DIAGNOSIS — I2510 Atherosclerotic heart disease of native coronary artery without angina pectoris: Secondary | ICD-10-CM | POA: Diagnosis not present

## 2019-01-13 DIAGNOSIS — Z5181 Encounter for therapeutic drug level monitoring: Secondary | ICD-10-CM | POA: Diagnosis not present

## 2019-01-13 DIAGNOSIS — E538 Deficiency of other specified B group vitamins: Secondary | ICD-10-CM | POA: Diagnosis not present

## 2019-01-13 DIAGNOSIS — E512 Wernicke's encephalopathy: Secondary | ICD-10-CM | POA: Diagnosis not present

## 2019-01-13 DIAGNOSIS — I2510 Atherosclerotic heart disease of native coronary artery without angina pectoris: Secondary | ICD-10-CM | POA: Diagnosis not present

## 2019-01-13 DIAGNOSIS — Z79899 Other long term (current) drug therapy: Secondary | ICD-10-CM | POA: Diagnosis not present

## 2019-01-13 DIAGNOSIS — F418 Other specified anxiety disorders: Secondary | ICD-10-CM | POA: Diagnosis not present

## 2019-01-13 DIAGNOSIS — E039 Hypothyroidism, unspecified: Secondary | ICD-10-CM | POA: Diagnosis not present

## 2019-02-23 ENCOUNTER — Ambulatory Visit: Payer: Medicare Other | Admitting: Cardiovascular Disease

## 2019-03-06 ENCOUNTER — Other Ambulatory Visit: Payer: Self-pay | Admitting: Nurse Practitioner

## 2019-03-14 ENCOUNTER — Ambulatory Visit (INDEPENDENT_AMBULATORY_CARE_PROVIDER_SITE_OTHER): Payer: Medicare Other | Admitting: *Deleted

## 2019-03-14 ENCOUNTER — Other Ambulatory Visit: Payer: Self-pay

## 2019-03-14 DIAGNOSIS — I4901 Ventricular fibrillation: Secondary | ICD-10-CM

## 2019-03-14 LAB — CUP PACEART REMOTE DEVICE CHECK
Battery Remaining Longevity: 37 mo
Battery Remaining Percentage: 32 %
Battery Voltage: 2.87 V
Brady Statistic RV Percent Paced: 1 %
Date Time Interrogation Session: 20200317064851
HighPow Impedance: 79 Ohm
HighPow Impedance: 79 Ohm
Implantable Lead Implant Date: 20110912
Implantable Lead Location: 753860
Implantable Lead Model: 7122
Implantable Pulse Generator Implant Date: 20110912
Lead Channel Impedance Value: 360 Ohm
Lead Channel Pacing Threshold Amplitude: 1 V
Lead Channel Pacing Threshold Pulse Width: 0.5 ms
Lead Channel Sensing Intrinsic Amplitude: 7.5 mV
Lead Channel Setting Pacing Amplitude: 2.5 V
Lead Channel Setting Pacing Pulse Width: 0.5 ms
Lead Channel Setting Sensing Sensitivity: 0.5 mV
Pulse Gen Serial Number: 618386

## 2019-03-22 ENCOUNTER — Encounter: Payer: Self-pay | Admitting: Cardiology

## 2019-03-22 NOTE — Progress Notes (Signed)
Remote ICD transmission.   

## 2019-06-13 ENCOUNTER — Ambulatory Visit (INDEPENDENT_AMBULATORY_CARE_PROVIDER_SITE_OTHER): Payer: Medicare Other | Admitting: *Deleted

## 2019-06-13 DIAGNOSIS — I4901 Ventricular fibrillation: Secondary | ICD-10-CM

## 2019-06-14 ENCOUNTER — Telehealth: Payer: Self-pay

## 2019-06-14 NOTE — Telephone Encounter (Signed)
Left message for patient to remind of missed remote transmission.  

## 2019-06-19 LAB — CUP PACEART REMOTE DEVICE CHECK
Battery Remaining Longevity: 36 mo
Battery Remaining Percentage: 31 %
Battery Voltage: 2.86 V
Brady Statistic RV Percent Paced: 1 %
Date Time Interrogation Session: 20200620051736
HighPow Impedance: 71 Ohm
HighPow Impedance: 71 Ohm
Implantable Lead Implant Date: 20110912
Implantable Lead Location: 753860
Implantable Lead Model: 7122
Implantable Pulse Generator Implant Date: 20110912
Lead Channel Impedance Value: 350 Ohm
Lead Channel Pacing Threshold Amplitude: 1 V
Lead Channel Pacing Threshold Pulse Width: 0.5 ms
Lead Channel Sensing Intrinsic Amplitude: 6.5 mV
Lead Channel Setting Pacing Amplitude: 2.5 V
Lead Channel Setting Pacing Pulse Width: 0.5 ms
Lead Channel Setting Sensing Sensitivity: 0.5 mV
Pulse Gen Serial Number: 618386

## 2019-06-23 ENCOUNTER — Encounter: Payer: Self-pay | Admitting: Cardiology

## 2019-06-23 NOTE — Progress Notes (Signed)
Remote ICD transmission.   

## 2019-07-07 DIAGNOSIS — E538 Deficiency of other specified B group vitamins: Secondary | ICD-10-CM | POA: Diagnosis not present

## 2019-07-07 DIAGNOSIS — Z79899 Other long term (current) drug therapy: Secondary | ICD-10-CM | POA: Diagnosis not present

## 2019-07-07 DIAGNOSIS — E039 Hypothyroidism, unspecified: Secondary | ICD-10-CM | POA: Diagnosis not present

## 2019-07-07 DIAGNOSIS — I2510 Atherosclerotic heart disease of native coronary artery without angina pectoris: Secondary | ICD-10-CM | POA: Diagnosis not present

## 2019-07-14 DIAGNOSIS — E538 Deficiency of other specified B group vitamins: Secondary | ICD-10-CM | POA: Diagnosis not present

## 2019-07-14 DIAGNOSIS — Z0000 Encounter for general adult medical examination without abnormal findings: Secondary | ICD-10-CM | POA: Diagnosis not present

## 2019-07-14 DIAGNOSIS — E512 Wernicke's encephalopathy: Secondary | ICD-10-CM | POA: Diagnosis not present

## 2019-07-14 DIAGNOSIS — F419 Anxiety disorder, unspecified: Secondary | ICD-10-CM | POA: Diagnosis not present

## 2019-07-14 DIAGNOSIS — E559 Vitamin D deficiency, unspecified: Secondary | ICD-10-CM | POA: Diagnosis not present

## 2019-07-14 DIAGNOSIS — I2510 Atherosclerotic heart disease of native coronary artery without angina pectoris: Secondary | ICD-10-CM | POA: Diagnosis not present

## 2019-07-14 DIAGNOSIS — E039 Hypothyroidism, unspecified: Secondary | ICD-10-CM | POA: Diagnosis not present

## 2019-07-28 ENCOUNTER — Telehealth: Payer: Self-pay

## 2019-07-28 NOTE — Telephone Encounter (Signed)
No answer when called to do covid 19 screening questions. 

## 2019-07-29 NOTE — Progress Notes (Signed)
Cardiology Office Note Date:  07/31/2019  Patient ID:  Adam, Orr 1966-10-15, MRN [id number] PCP:  Jani Gravel, MD  Cardiologist:  Dr. Burt Knack Electrophysiologist: Dr. Rayann Heman    Chief Complaint:  annual EP visit  History of Present Illness: Adam Orr is a 53 y.o. 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  He last saw Dr. Burt Knack Dec 2019, at that visit noted he was off long acting nitrates doing well and planned for annual visits. He comes in today to be seen for Dr. Rayann Heman, last seen by him July 2019.  ALso doing well, no device programming changes were made, planned for annual visit.  He is accompanied by his mother today.  They both report he is doing very well.  No ICD shocks, no CP, palpitations or SOB, no DOE, or exertional intolerances.  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 (doesn't say where LV goes to)    VF (ventricular fibrillation) ([place])    arrest    Past Surgical History:  Procedure Laterality Date   ICD implantation     with defibrillation threshold testing. [employer]    Current Outpatient Medications  Medication Sig Dispense Refill   aspirin 81 MG chewable tablet Chew 81 mg by mouth daily.     Cholecalciferol (VITAMIN D-3) 5000 UNITS TABS Take 5,000 Units by mouth daily.      citalopram (CELEXA) 20 MG tablet Take 1 tablet (20 mg total) by mouth daily.       cyanocobalamin (,VITAMIN B-12,) 1000 MCG/ML injection INJECT 1000MG   INTRAMUSCULARLY AS DIRECTED FOR 30 DAYS 1 mL 5   Divalproex Sodium (DEPAKOTE PO) Take 125 mg by mouth daily.      folic acid (FOLVITE) 1 MG tablet Take 1 tablet (1 mg total) by mouth daily.     levothyroxine (SYNTHROID, LEVOTHROID) 50 MCG tablet Take 50 mcg by mouth daily before breakfast.     LORazepam (ATIVAN) 0.5 MG tablet Take 0.5 mg by mouth daily.      Multiple Vitamin (MULTIVITAMIN) tablet Take 1 tablet by mouth daily.     simvastatin (ZOCOR) 5 MG tablet Take 1 tablet (5 mg total) by mouth daily. 30 tablet 11   terazosin (HYTRIN) 1 MG capsule Take 1 mg by mouth daily.      Thiamine HCl (VITAMIN B-1) 250 MG tablet Take 250 mg by mouth daily.     No current facility-administered medications for this visit.     Allergies:   Penicillins, Mirtazapine, Smallpox vaccine, Namenda [memantine hcl], and Melatonin   Social History:  The patient  reports that he has quit smoking. His smoking use included cigarettes. He started smoking about 9 years ago. He has never used smokeless tobacco. He reports current alcohol use of about 84.0 standard drinks of alcohol per week. He reports that he does not use drugs.   Family History:  The patient's family history includes Coronary  artery disease in his mother; Prostate cancer in his father and paternal grandfather.  ROS:  Please see the history of present illness.   All other systems are reviewed and otherwise negative.   PHYSICAL EXAM:  VS:  BP 112/76    Pulse (!) 54    Ht 6\' 3"  (1.905 m)    Wt 210 lb (95.3 kg)    BMI 26.25 kg/m  BMI: Body mass index is 26.25 kg/m. Well nourished, well developed, in no acute distress  HEENT: normocephalic, atraumatic  Neck: no JVD, carotid bruits or masses Cardiac:  RRR; no significant murmurs, no rubs, or gallops Lungs: CTA b/l , no wheezing, rhonchi or rales  Abd: soft, nontender MS: no deformity or atrophy Ext: no edema  Skin:  warm and dry, no rash Neuro:  No gross deficits appreciated Psych: euthymic mood, full affect  ICD site is stable, no tethering or discomfort   EKG:  Done today and reviewed by myself shows SB 54bpm, no ST/t changes  ICD interrogation done today and reviewed by myself: battery and lead measurements are good, no arrhythmias or device observations  12/26/12: TTE Study Conclusions  - Left ventricle: The cavity size was normal. Wall thickness  was normal. Systolic function was normal. The estimated  ejection fraction was in the range of 50% to 55%. Wall  motion was normal; there were no regional wall motion  abnormalities. Doppler parameters are consistent with  abnormal left ventricular relaxation (grade 1 diastolic  dysfunction).  - Mitral valve: Calcified annulus.   Recent Labs: No results found for requested labs within last 8760 hours.  No results found for requested labs within last 8760 hours.   CrCl cannot be calculated (Patient's most recent lab result is older than the maximum 21 days allowed.).   Wt Readings from Last 3 Encounters:  07/31/19 210 lb (95.3 kg)  11/30/18 217 lb 6.4 oz (98.6 kg)  09/16/18 217 lb 6.4 oz (98.6 kg)     Other studies reviewed: Additional studies/records reviewed today include: summarized above  ASSESSMENT AND PLAN:  1. Vasospasm, aborted VF arrest      Follows with Dr. Excell Seltzerooper      Due to see him in dec  2. ICD     Intact function, stable measurements     Continue Q 82mo remotes    Disposition: F/u with Dr. Excell Seltzerooper in Dec, Dr. Johney FrameAllred or myself in 1 year, sooner if needed  Current medicines are reviewed at length with the patient today.  The patient did not have any concerns regarding medicines.  Norma FredricksonSigned, Kensli Bowley, PA-C 07/31/2019 1:38 PM     [facility] [address] [place] [id number] [phone] (office)  [phone]) [phone] (fax)

## 2019-07-31 ENCOUNTER — Encounter: Payer: Self-pay | Admitting: Physician Assistant

## 2019-07-31 ENCOUNTER — Ambulatory Visit (INDEPENDENT_AMBULATORY_CARE_PROVIDER_SITE_OTHER): Payer: Medicare Other | Admitting: Physician Assistant

## 2019-07-31 ENCOUNTER — Other Ambulatory Visit: Payer: Self-pay

## 2019-07-31 VITALS — BP 112/76 | HR 54 | Ht 75.0 in | Wt 210.0 lb

## 2019-07-31 DIAGNOSIS — Z95810 Presence of automatic (implantable) cardiac defibrillator: Secondary | ICD-10-CM

## 2019-07-31 DIAGNOSIS — I4901 Ventricular fibrillation: Secondary | ICD-10-CM

## 2019-07-31 NOTE — Patient Instructions (Signed)
Medication Instructions:  Your physician recommends that you continue on your current medications as directed. Please refer to the Current Medication list given to you today.  If you need a refill on your cardiac medications before your next appointment, please call your pharmacy.   Lab work: NONE ORDERED  TODAY   If you have labs (blood work) drawn today and your tests are completely normal, you will receive your results only by: Marland Kitchen MyChart Message (if you have MyChart) OR . A paper copy in the mail If you have any lab test that is abnormal or we need to change your treatment, we will call you to review the results.  Testing/Procedures: NONE ORDERED  TODAY  Follow-Up: At [facility], you and your health needs are our priority.  As part of our continuing mission to provide you with exceptional heart care, we have created designated Provider Care Teams.  These Care Teams include your primary Cardiologist (physician) and Advanced Practice Providers (APPs -  Physician Assistants and Nurse Practitioners) who all work together to provide you with the care you need, when you need it. You will need a follow up appointment in:  4 months.  Please call our office 2 months in advance to schedule this appointment.  You may see Sherren Mocha, MD or one of the following Advanced Practice Providers on your designated Care Team: Richardson Dopp, PA-C [place], [place] . Daune Perch, NP  Remote monitoring is used to monitor your Pacemaker of ICD from home. This monitoring reduces the number of office visits required to check your device to one time per year. It allows [place] to keep an eye on the functioning of your device to ensure it is working properly. You are scheduled for a device check from home on . 09/12/2019 You may send your transmission at any time that day. If you have a wireless device, the transmission will be sent automatically. After your physician reviews your transmission, you will receive a  postcard with your next transmission date.  Your physician wants you to follow-up in: [place] will receive a reminder letter in the mail two months in advance. If you don't receive a letter, please call our office to schedule the follow-up appointment.    Any Other Special Instructions Will Be Listed Below (If Applicable).

## 2019-09-12 ENCOUNTER — Ambulatory Visit (INDEPENDENT_AMBULATORY_CARE_PROVIDER_SITE_OTHER): Payer: Medicare Other | Admitting: *Deleted

## 2019-09-12 DIAGNOSIS — I4901 Ventricular fibrillation: Secondary | ICD-10-CM

## 2019-09-15 DIAGNOSIS — Z23 Encounter for immunization: Secondary | ICD-10-CM | POA: Diagnosis not present

## 2019-09-16 LAB — CUP PACEART REMOTE DEVICE CHECK
Battery Remaining Longevity: 32 mo
Battery Remaining Percentage: 28 %
Battery Voltage: 2.84 V
Brady Statistic RV Percent Paced: 1 %
Date Time Interrogation Session: 20200919034533
HighPow Impedance: 73 Ohm
HighPow Impedance: 73 Ohm
Implantable Lead Implant Date: 20110912
Implantable Lead Location: 753860
Implantable Lead Model: 7122
Implantable Pulse Generator Implant Date: 20110912
Lead Channel Impedance Value: 350 Ohm
Lead Channel Pacing Threshold Amplitude: 0.75 V
Lead Channel Pacing Threshold Pulse Width: 0.5 ms
Lead Channel Sensing Intrinsic Amplitude: 7.3 mV
Lead Channel Setting Pacing Amplitude: 2.5 V
Lead Channel Setting Pacing Pulse Width: 0.5 ms
Lead Channel Setting Sensing Sensitivity: 0.5 mV
Pulse Gen Serial Number: 618386

## 2019-09-18 ENCOUNTER — Ambulatory Visit: Payer: Medicare Other | Admitting: Neurology

## 2019-09-19 ENCOUNTER — Ambulatory Visit (INDEPENDENT_AMBULATORY_CARE_PROVIDER_SITE_OTHER): Payer: Medicare Other | Admitting: Neurology

## 2019-09-19 ENCOUNTER — Other Ambulatory Visit: Payer: Self-pay

## 2019-09-19 ENCOUNTER — Encounter: Payer: Self-pay | Admitting: Neurology

## 2019-09-19 VITALS — BP 103/67 | HR 58 | Temp 97.3°F | Ht 75.0 in | Wt 212.0 lb

## 2019-09-19 DIAGNOSIS — Z8639 Personal history of other endocrine, nutritional and metabolic disease: Secondary | ICD-10-CM

## 2019-09-19 DIAGNOSIS — Z8669 Personal history of other diseases of the nervous system and sense organs: Secondary | ICD-10-CM | POA: Diagnosis not present

## 2019-09-19 DIAGNOSIS — R413 Other amnesia: Secondary | ICD-10-CM | POA: Diagnosis not present

## 2019-09-19 DIAGNOSIS — E538 Deficiency of other specified B group vitamins: Secondary | ICD-10-CM

## 2019-09-19 NOTE — Progress Notes (Signed)
Remote ICD transmission.   
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
## 2019-09-20 ENCOUNTER — Telehealth: Payer: Self-pay | Admitting: *Deleted

## 2019-09-20 LAB — VITAMIN B12: Vitamin B-12: 446 pg/mL (ref 232–1245)

## 2019-09-20 NOTE — Telephone Encounter (Signed)
-----   Message from Marcial Pacas, MD sent at 09/20/2019  2:44 PM EDT ----- Please call patient for normal B12 level 446, it is okay for him to change p.o. B12 supplement, 1000 mcg daily

## 2019-09-20 NOTE — Telephone Encounter (Signed)
I have spoken with his mother, Jahmere Bramel (on [place]).  She is aware of the his results and agreeable to start him on the recommended oral supplement of B12.

## 2019-10-09 DIAGNOSIS — H1789 Other corneal scars and opacities: Secondary | ICD-10-CM | POA: Diagnosis not present

## 2019-10-09 DIAGNOSIS — H2513 Age-related nuclear cataract, bilateral: Secondary | ICD-10-CM | POA: Diagnosis not present

## 2019-12-04 ENCOUNTER — Ambulatory Visit: Payer: Medicare Other | Admitting: Physician Assistant

## 2019-12-12 ENCOUNTER — Ambulatory Visit (INDEPENDENT_AMBULATORY_CARE_PROVIDER_SITE_OTHER): Payer: Medicare Other | Admitting: *Deleted

## 2019-12-12 DIAGNOSIS — Z95810 Presence of automatic (implantable) cardiac defibrillator: Secondary | ICD-10-CM | POA: Diagnosis not present

## 2019-12-12 LAB — CUP PACEART REMOTE DEVICE CHECK
Battery Remaining Longevity: 31 mo
Battery Remaining Percentage: 27 %
Battery Voltage: 2.83 V
Brady Statistic RV Percent Paced: 1 %
Date Time Interrogation Session: 20201215051048
HighPow Impedance: 75 Ohm
HighPow Impedance: 75 Ohm
Implantable Lead Implant Date: 20110912
Implantable Lead Location: 753860
Implantable Lead Model: 7122
Implantable Pulse Generator Implant Date: 20110912
Lead Channel Impedance Value: 360 Ohm
Lead Channel Pacing Threshold Amplitude: 0.75 V
Lead Channel Pacing Threshold Pulse Width: 0.5 ms
Lead Channel Sensing Intrinsic Amplitude: 8.8 mV
Lead Channel Setting Pacing Amplitude: 2.5 V
Lead Channel Setting Pacing Pulse Width: 0.5 ms
Lead Channel Setting Sensing Sensitivity: 0.5 mV
Pulse Gen Serial Number: 618386

## 2020-01-08 DIAGNOSIS — I2510 Atherosclerotic heart disease of native coronary artery without angina pectoris: Secondary | ICD-10-CM | POA: Diagnosis not present

## 2020-01-08 DIAGNOSIS — E538 Deficiency of other specified B group vitamins: Secondary | ICD-10-CM | POA: Diagnosis not present

## 2020-01-08 DIAGNOSIS — E559 Vitamin D deficiency, unspecified: Secondary | ICD-10-CM | POA: Diagnosis not present

## 2020-01-08 DIAGNOSIS — E039 Hypothyroidism, unspecified: Secondary | ICD-10-CM | POA: Diagnosis not present

## 2020-01-13 NOTE — Progress Notes (Signed)
ICD remote 

## 2020-01-15 DIAGNOSIS — F419 Anxiety disorder, unspecified: Secondary | ICD-10-CM | POA: Diagnosis not present

## 2020-01-15 DIAGNOSIS — Z0000 Encounter for general adult medical examination without abnormal findings: Secondary | ICD-10-CM | POA: Diagnosis not present

## 2020-01-15 DIAGNOSIS — E512 Wernicke's encephalopathy: Secondary | ICD-10-CM | POA: Diagnosis not present

## 2020-01-15 DIAGNOSIS — E039 Hypothyroidism, unspecified: Secondary | ICD-10-CM | POA: Diagnosis not present

## 2020-01-15 DIAGNOSIS — E538 Deficiency of other specified B group vitamins: Secondary | ICD-10-CM | POA: Diagnosis not present

## 2020-01-15 DIAGNOSIS — H6120 Impacted cerumen, unspecified ear: Secondary | ICD-10-CM | POA: Diagnosis not present

## 2020-01-15 DIAGNOSIS — I2510 Atherosclerotic heart disease of native coronary artery without angina pectoris: Secondary | ICD-10-CM | POA: Diagnosis not present

## 2020-01-18 ENCOUNTER — Telehealth: Payer: Self-pay | Admitting: Physician Assistant

## 2020-01-18 NOTE — Telephone Encounter (Signed)
New Message:   Mother called and said she will need s

## 2020-01-18 NOTE — Telephone Encounter (Signed)
I called and spoke with patients mother Clydie Braun, she will need to accompany patient to appointment on 01/23/20 due to patient having dementia. Advised Clydie Braun that was ok, note made in appointment notes.

## 2020-01-18 NOTE — Telephone Encounter (Signed)
Message form the operator is incomplete. Not sure if message is about upcoming appt 01/23/20 with Tereso Newcomer, PAC.

## 2020-01-22 NOTE — Progress Notes (Signed)
Cardiology Office Note:    Date:  01/23/2020   ID:  Adam Orr, DOB 11-10-1966, MRN [id number]  PCP:  Pearson Grippe, MD  Cardiologist:  Tonny Bollman, MD  Electrophysiologist:  Hillis Range, MD   Referring MD: Pearson Grippe, MD   Chief Complaint: follow-up of CAD and coronary vasospasms  History of Present Illness:    Adam Orr is a 54 y.o. male with a history of non-obstuctive CAD, coronary vasopasms, out of hospital ventricular fibrillation arrest s/p ICD implant in 08/2010, Wernicke encephalopathy with significant memory impairment who is followed by Dr. Excell Seltzer and Dr. Johney Frame.   Patient has done well from a cardiac standpoint for the last several years. He was last seen by Dr. Excell Seltzer in 11/2018 at which time he was doing well off long-acting nitrates and denied any cardiac symptoms. He was seen by Francis Dowse, PA-C with EP. He was still doing well at that time. No ICD shocks. No changes were made.   Patient presents today for annual follow-up. He is here today with his mother. Patient continues to do well from a cardiac standpoint. No chest pain, shortness of breath, palpitations, lightheadedness, dizziness, syncope, orthopnea, PND, or edema. No ICD shocks. He is staying active.   Past Medical History:  Diagnosis Date  . Anxiety   . Automatic implantable cardiac defibrillator   . CAD (coronary artery disease)    nonobstructive  . 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 Depression   . 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 (doesn't say where LV goes to)   . VF (ventricular fibrillation) (HCC)    arrest    Past Surgical History:  Procedure Laterality Date  . ICD implantation     with defibrillation threshold testing. St. Jude    Current Medications: Current Meds  Medication Sig  . aspirin 81 MG chewable tablet Chew 81 mg by mouth daily.  . Cholecalciferol (VITAMIN D-3) 5000 UNITS TABS Take 5,000 Units by mouth daily.   . citalopram (CELEXA)  20 MG tablet Take 1 tablet (20 mg total) by mouth daily.  . Divalproex Sodium (DEPAKOTE PO) Take 125 mg by mouth daily.   . folic acid (FOLVITE) 1 MG tablet Take 1 tablet (1 mg total) by mouth daily.  Marland Kitchen levothyroxine (SYNTHROID, LEVOTHROID) 50 MCG tablet Take 50 mcg by mouth daily before breakfast.  . LORazepam (ATIVAN) 0.5 MG tablet Take 0.5 mg by mouth daily.   . Multiple Vitamin (MULTIVITAMIN) tablet Take 1 tablet by mouth daily.  . simvastatin (ZOCOR) 5 MG tablet Take 1 tablet (5 mg total) by mouth daily.  Marland Kitchen terazosin (HYTRIN) 1 MG capsule Take 1 mg by mouth daily.   . Thiamine HCl (VITAMIN B-1) 250 MG tablet Take 250 mg by mouth daily.  . vitamin B-12 (CYANOCOBALAMIN) 1000 MCG tablet Take 1,000 mcg by mouth daily.     Allergies:   Penicillins, Mirtazapine, Smallpox vaccine, Namenda [memantine hcl], and Melatonin   Social History   Socioeconomic History  . Marital status: Divorced    Spouse name: Not on file  . Number of children: 2  . Years of education: Not on file  . 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 Smoking status: Former Smoker    Types: Cigarettes    Start date: 01/23/2010  . Smokeless tobacco:  Never Used  . Tobacco comment: he continues to smoke occasionally, smoked for more than 20 years.   Substance and Sexual Activity  . Alcohol use: Yes    Alcohol/week: 84.0 standard drinks    Types: 84 Cans of beer per week  . Drug use: No  . Sexual activity: Not on file  Other Topics Concern  . Not on file  Social History Narrative   Divorced, 2 children.    Patient lives at home with his mother Adam Orr.   Patient was in [employer] for 12 years.    Left-handed.   Social Determinants of Health   Financial Resource Strain:   . Difficulty of Paying Living Expenses: Not on file  Food Insecurity:   . Worried About Charity fundraiser in the Last  Year: Not on file  . Ran Out of Food in the Last Year: Not on file  Transportation Needs:   . Lack of Transportation (Medical): Not on file  . Lack of Transportation (Non-Medical): Not on file  Physical Activity:   . Days of Exercise per Week: Not on file  . Minutes of Exercise per Session: Not on file  Stress:   . Feeling of Stress : Not on file  Social Connections:   . Frequency of Communication with Friends and Family: Not on file  . Frequency of Social Gatherings with Friends and Family: Not on file  . Attends Religious Services: Not on file  . Active Member of Clubs or Organizations: Not on file  . Attends Archivist Meetings: Not on file  . Marital Status: Not on file     Family History: The patient's family history includes Coronary artery disease in his mother; Prostate cancer in his father and paternal grandfather.  ROS:   Please see the history of present illness.    All other systems reviewed and are negative.  EKGs/Labs/Other Studies Reviewed:    The following studies were reviewed today:  Echocardiogram 12/26/2012: Study Conclusions: - Left ventricle: The cavity size was normal. Wall thickness  was normal. Systolic function was normal. The estimated  ejection fraction was in the range of 50% to 55%. Wall  motion was normal; there were no regional wall motion  abnormalities. Doppler parameters are consistent with  abnormal left ventricular relaxation (grade 1 diastolic  dysfunction).  - Mitral valve: Calcified annulus.   EKG:  EKG ordered today. EKG personally reviewed and demonstrates normal sinus rhythm, rate 63 bpm, with no acute ST/T changes compared to prior tracing. Normal PR and QRS intervals. QTc 429 ms.   Recent Labs: No results found for requested labs within last 8760 hours.  Recent Lipid Panel    Component Value Date/Time   CHOL 203 (H) 11/24/2010 2142   TRIG 195 (H) 11/24/2010 2142   HDL 54 11/24/2010 2142   CHOLHDL 3.8  Ratio 11/24/2010 2142   VLDL 39 11/24/2010 2142   LDLCALC 110 (H) 11/24/2010 2142    Physical Exam:    Vital Signs: BP 102/74   Pulse 64   Ht 6\' 3"  (1.905 m)   Wt 218 lb (98.9 kg)   SpO2 99%   BMI 27.25 kg/m     Wt Readings from Last 3 Encounters:  01/23/20 218 lb (98.9 kg)  09/19/19 212 lb (96.2 kg)  07/31/19 210 lb (95.3 kg)     General: 54 y.o. male in no acute distress. HEENT: Normocephalic and atraumatic. Sclera clear.  Neck: Supple. No carotid bruits. No  JVD. Heart: RRR. Distinct S1 and S2. No murmurs, gallops, or rubs. Radial and posterior tibial pulses 2+ and equal bilaterally. Lungs: No increased work of breathing. Clear to ausculation bilaterally. No wheezes, rhonchi, or rales.  Abdomen: Soft, non-distended, and non-tender to palpation. Bowel sounds present. MSK: Normal strength and tone for age. Extremities: No lower extremity edema.    Skin: Warm and dry. Neuro: Alert and oriented x3. No focal deficits. Psych: Normal affect. Responds appropriately.  Assessment:    1. Coronary artery disease involving native coronary artery of native heart without angina pectoris   2. Coronary vasospasm (HCC)   3. History of ventricular fibrillation   4. S/P ICD (internal cardiac defibrillator) procedure   5. Hyperlipidemia, unspecified hyperlipidemia type     Plan:    Nonobstructive CAD and Coronary Vasospasms - Stable. Remains asymptomatic. Doing well off Imdur. - Continue Aspirin 81mg  daily and Simvastatin 5mg  daily.   History of Ventricular Fibrillation Arrest s/p ICD - Doing well.  - Last device interrogation on 12/12/2019 showed normal device function, no detected episodes of VT or VF, and no ICD shocks.  - Follow-up with EP as directed.  Hyperlipidemia - Recently had labs checked at PCP. Total Cholesterol 161, Triglycerides 193, HDL 50. Unable to see LDL on KPN. Will see if we can get full lab results from PCP's office. - Continue Simvastatin 5mg  daily.    Disposition: Follow up in 1 year with Dr. .    Medication Adjustments/Labs and Tests Ordered: Current medicines are reviewed at length with the patient today.  Concerns regarding medicines are outlined above.  Orders Placed This Encounter  Procedures  . EKG 12-Lead   No orders of the defined types were placed in this encounter.   Patient Instructions  Medication Instructions:   Your physician recommends that you continue on your current medications as directed. Please refer to the Current Medication list given to you today.  *If you need a refill on your cardiac medications before your next appointment, please call your pharmacy*  Lab Work:  None ordered today  If you have labs (blood work) drawn today and your tests are completely normal, you will receive your results only by: 12/14/2019 MyChart Message (if you have MyChart) OR . A paper copy in the mail If you have any lab test that is abnormal or we need to change your treatment, we will call you to review the results.  Testing/Procedures:  None ordered today  Follow-Up: At [facility], you and your health needs are our priority.  As part of our continuing mission to provide you with exceptional heart care, we have created designated Provider Care Teams.  These Care Teams include your primary Cardiologist (physician) and Advanced Practice Providers (APPs -  Physician Assistants and Nurse Practitioners) who all work together to provide you with the care you need, when you need it.  Your next appointment:   12 month(s)  The format for your next appointment:   In Person  Provider:   You may see Excell Seltzer, MD or one of the following Advanced Practice Providers on your designated Care Team:    Marland Kitchen, PA-C  [place], Tonny Bollman  Tereso Newcomer, NP        Signed, [place], [place]  01/23/2020 3:57 PM    [facility]

## 2020-01-23 ENCOUNTER — Other Ambulatory Visit: Payer: Self-pay

## 2020-01-23 ENCOUNTER — Encounter: Payer: Self-pay | Admitting: Physician Assistant

## 2020-01-23 ENCOUNTER — Ambulatory Visit (INDEPENDENT_AMBULATORY_CARE_PROVIDER_SITE_OTHER): Payer: Medicare Other | Admitting: Student

## 2020-01-23 VITALS — BP 102/74 | HR 64 | Ht 75.0 in | Wt 218.0 lb

## 2020-01-23 DIAGNOSIS — Z8679 Personal history of other diseases of the circulatory system: Secondary | ICD-10-CM | POA: Diagnosis not present

## 2020-01-23 DIAGNOSIS — I201 Angina pectoris with documented spasm: Secondary | ICD-10-CM

## 2020-01-23 DIAGNOSIS — Z95810 Presence of automatic (implantable) cardiac defibrillator: Secondary | ICD-10-CM

## 2020-01-23 DIAGNOSIS — E785 Hyperlipidemia, unspecified: Secondary | ICD-10-CM

## 2020-01-23 DIAGNOSIS — I2510 Atherosclerotic heart disease of native coronary artery without angina pectoris: Secondary | ICD-10-CM

## 2020-01-23 NOTE — Addendum Note (Signed)
Addended by: Marjie Skiff on: 01/23/2020 04:30 PM   Modules accepted: Level of Service

## 2020-01-23 NOTE — Patient Instructions (Signed)
Medication Instructions:   Your physician recommends that you continue on your current medications as directed. Please refer to the Current Medication list given to you today.  *If you need a refill on your cardiac medications before your next appointment, please call your pharmacy*  Lab Work:  None ordered today  If you have labs (blood work) drawn today and your tests are completely normal, you will receive your results only by: . MyChart Message (if you have MyChart) OR . A paper copy in the mail If you have any lab test that is abnormal or we need to change your treatment, we will call you to review the results.  Testing/Procedures:  None ordered today  Follow-Up: At [facility], you and your health needs are our priority.  As part of our continuing mission to provide you with exceptional heart care, we have created designated Provider Care Teams.  These Care Teams include your primary Cardiologist (physician) and Advanced Practice Providers (APPs -  Physician Assistants and Nurse Practitioners) who all work together to provide you with the care you need, when you need it.  Your next appointment:   12 month(s)  The format for your next appointment:   In Person  Provider:   You may see Michael Cooper, MD or one of the following Advanced Practice Providers on your designated Care Team:    Scott Weaver, PA-C  Vin Bhagat, PA-C  Janine Hammond, NP     

## 2020-03-12 ENCOUNTER — Ambulatory Visit (INDEPENDENT_AMBULATORY_CARE_PROVIDER_SITE_OTHER): Payer: Medicare Other | Admitting: *Deleted

## 2020-03-12 DIAGNOSIS — Z95810 Presence of automatic (implantable) cardiac defibrillator: Secondary | ICD-10-CM | POA: Diagnosis not present

## 2020-03-13 LAB — CUP PACEART REMOTE DEVICE CHECK
Battery Remaining Longevity: 29 mo
Battery Remaining Percentage: 25 %
Battery Voltage: 2.81 V
Brady Statistic RV Percent Paced: 1 %
Date Time Interrogation Session: 20210317084407
HighPow Impedance: 74 Ohm
HighPow Impedance: 74 Ohm
Implantable Lead Implant Date: 20110912
Implantable Lead Location: 753860
Implantable Lead Model: 7122
Implantable Pulse Generator Implant Date: 20110912
Lead Channel Impedance Value: 360 Ohm
Lead Channel Pacing Threshold Amplitude: 0.75 V
Lead Channel Pacing Threshold Pulse Width: 0.5 ms
Lead Channel Sensing Intrinsic Amplitude: 8.3 mV
Lead Channel Setting Pacing Amplitude: 2.5 V
Lead Channel Setting Pacing Pulse Width: 0.5 ms
Lead Channel Setting Sensing Sensitivity: 0.5 mV
Pulse Gen Serial Number: 618386

## 2020-03-13 NOTE — Progress Notes (Signed)
ICD Remote  

## 2020-06-11 ENCOUNTER — Ambulatory Visit (INDEPENDENT_AMBULATORY_CARE_PROVIDER_SITE_OTHER): Payer: Medicare Other | Admitting: *Deleted

## 2020-06-11 DIAGNOSIS — I4901 Ventricular fibrillation: Secondary | ICD-10-CM

## 2020-06-12 LAB — CUP PACEART REMOTE DEVICE CHECK
Battery Remaining Longevity: 25 mo
Battery Remaining Percentage: 22 %
Battery Voltage: 2.78 V
Brady Statistic RV Percent Paced: 1 %
Date Time Interrogation Session: 20210616051120
HighPow Impedance: 75 Ohm
HighPow Impedance: 75 Ohm
Implantable Lead Implant Date: 20110912
Implantable Lead Location: 753860
Implantable Lead Model: 7122
Implantable Pulse Generator Implant Date: 20110912
Lead Channel Impedance Value: 350 Ohm
Lead Channel Pacing Threshold Amplitude: 0.75 V
Lead Channel Pacing Threshold Pulse Width: 0.5 ms
Lead Channel Sensing Intrinsic Amplitude: 6.5 mV
Lead Channel Setting Pacing Amplitude: 2.5 V
Lead Channel Setting Pacing Pulse Width: 0.5 ms
Lead Channel Setting Sensing Sensitivity: 0.5 mV
Pulse Gen Serial Number: 618386

## 2020-06-12 NOTE — Progress Notes (Signed)
Remote ICD transmission.   

## 2020-07-08 DIAGNOSIS — E039 Hypothyroidism, unspecified: Secondary | ICD-10-CM | POA: Diagnosis not present

## 2020-07-08 DIAGNOSIS — I2510 Atherosclerotic heart disease of native coronary artery without angina pectoris: Secondary | ICD-10-CM | POA: Diagnosis not present

## 2020-07-08 DIAGNOSIS — E538 Deficiency of other specified B group vitamins: Secondary | ICD-10-CM | POA: Diagnosis not present

## 2020-07-16 DIAGNOSIS — E559 Vitamin D deficiency, unspecified: Secondary | ICD-10-CM | POA: Diagnosis not present

## 2020-07-16 DIAGNOSIS — Z0000 Encounter for general adult medical examination without abnormal findings: Secondary | ICD-10-CM | POA: Diagnosis not present

## 2020-07-16 DIAGNOSIS — I2510 Atherosclerotic heart disease of native coronary artery without angina pectoris: Secondary | ICD-10-CM | POA: Diagnosis not present

## 2020-07-16 DIAGNOSIS — S99921A Unspecified injury of right foot, initial encounter: Secondary | ICD-10-CM | POA: Diagnosis not present

## 2020-07-16 DIAGNOSIS — M79674 Pain in right toe(s): Secondary | ICD-10-CM | POA: Diagnosis not present

## 2020-07-16 DIAGNOSIS — E512 Wernicke's encephalopathy: Secondary | ICD-10-CM | POA: Diagnosis not present

## 2020-07-16 DIAGNOSIS — F418 Other specified anxiety disorders: Secondary | ICD-10-CM | POA: Diagnosis not present

## 2020-07-16 DIAGNOSIS — E039 Hypothyroidism, unspecified: Secondary | ICD-10-CM | POA: Diagnosis not present

## 2020-08-23 ENCOUNTER — Encounter: Payer: Self-pay | Admitting: Internal Medicine

## 2020-08-23 ENCOUNTER — Ambulatory Visit (INDEPENDENT_AMBULATORY_CARE_PROVIDER_SITE_OTHER): Payer: Medicare Other | Admitting: Internal Medicine

## 2020-08-23 ENCOUNTER — Other Ambulatory Visit: Payer: Self-pay

## 2020-08-23 VITALS — BP 102/66 | HR 64 | Ht 75.0 in | Wt 217.0 lb

## 2020-08-23 DIAGNOSIS — I4901 Ventricular fibrillation: Secondary | ICD-10-CM

## 2020-08-23 DIAGNOSIS — I201 Angina pectoris with documented spasm: Secondary | ICD-10-CM

## 2020-08-23 LAB — CUP PACEART INCLINIC DEVICE CHECK
Battery Remaining Longevity: 26 mo
Brady Statistic RV Percent Paced: 0 %
Date Time Interrogation Session: 20210827133106
HighPow Impedance: 75.375
Implantable Lead Implant Date: 20110912
Implantable Lead Location: 753860
Implantable Lead Model: 7122
Implantable Pulse Generator Implant Date: 20110912
Lead Channel Impedance Value: 362.5 Ohm
Lead Channel Pacing Threshold Amplitude: 0.75 V
Lead Channel Pacing Threshold Amplitude: 0.75 V
Lead Channel Pacing Threshold Pulse Width: 0.5 ms
Lead Channel Pacing Threshold Pulse Width: 0.5 ms
Lead Channel Sensing Intrinsic Amplitude: 9.8 mV
Lead Channel Setting Pacing Amplitude: 2.5 V
Lead Channel Setting Pacing Pulse Width: 0.5 ms
Lead Channel Setting Sensing Sensitivity: 0.5 mV
Pulse Gen Serial Number: 618386

## 2020-08-23 NOTE — Patient Instructions (Addendum)
Medication Instructions:  Your physician recommends that you continue on your current medications as directed. Please refer to the Current Medication list given to you today.  *If you need a refill on your cardiac medications before your next appointment, please call your pharmacy*  Lab Work: None ordered.  If you have labs (blood work) drawn today and your tests are completely normal, you will receive your results only by: Marland Kitchen MyChart Message (if you have MyChart) OR . A paper copy in the mail If you have any lab test that is abnormal or we need to change your treatment, we will call you to review the results.  Testing/Procedures: None ordered.  Follow-Up: At [facility], you and your health needs are our priority.  As part of our continuing mission to provide you with exceptional heart care, we have created designated Provider Care Teams.  These Care Teams include your primary Cardiologist (physician) and Advanced Practice Providers (APPs -  Physician Assistants and Nurse Practitioners) who all work together to provide you with the care you need, when you need it.  We recommend signing up for the patient portal called "MyChart".  Sign up information is provided on this After Visit Summary.  MyChart is used to connect with patients for Virtual Visits (Telemedicine).  Patients are able to view lab/test results, encounter notes, upcoming appointments, etc.  Non-urgent messages can be sent to your provider as well.   To learn more about what you can do with MyChart, go to [email or web address].    Your next appointment:   Your physician wants you to follow-up in: 1 year with Renee. You will receive a reminder letter in the mail two months in advance. If you don't receive a letter, please call our office to schedule the follow-up appointment.  Remote monitoring is used to monitor your ICD from home. This monitoring reduces the number of office visits required to check your device to one time  per year. It allows [place] to keep an eye on the functioning of your device to ensure it is working properly. You are scheduled for a device check from home on 09/10/20. You may send your transmission at any time that day. If you have a wireless device, the transmission will be sent automatically. After your physician reviews your transmission, you will receive a postcard with your next transmission date.  Other Instructions:

## 2020-08-23 NOTE — Progress Notes (Signed)
PCP: Pearson Grippe, MD Primary Cardiologist: Dr Excell Seltzer Primary EP: Dr Johney Frame  Adam Orr is a 54 y.o. male who presents today for routine electrophysiology followup.  He has chronic dementia.  He is accompanied by his mother who provides history. Since last being seen in our clinic, the patient reports doing very well.  Today, he denies symptoms of palpitations, chest pain, shortness of breath,  lower extremity edema, dizziness, presyncope, syncope, or ICD shocks.  The patient is otherwise without complaint today.   Past Medical History:  Diagnosis Date  . Anxiety   . Automatic implantable cardiac defibrillator   . CAD (coronary artery disease)    nonobstructive  . 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 Depression   . 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 (doesn't say where LV goes to)   . VF (ventricular fibrillation) (HCC)    arrest   Past Surgical History:  Procedure Laterality Date  . ICD implantation     with defibrillation threshold testing. St. Jude    ROS- all systems are reviewed and negative except as per HPI above  Current Outpatient Medications  Medication Sig Dispense Refill  . aspirin 81 MG chewable tablet Chew 81 mg by mouth daily.    . Cholecalciferol (VITAMIN D-3) 5000 UNITS TABS Take 5,000 Units by mouth daily.     . citalopram (CELEXA) 20 MG tablet Take 1 tablet (20 mg total) by mouth daily.    . Divalproex Sodium (DEPAKOTE PO) Take 125 mg by mouth daily.     . folic acid (FOLVITE) 1 MG tablet Take 1 tablet (1 mg total) by mouth daily.    Marland Kitchen LORazepam (ATIVAN) 0.5 MG tablet Take 0.5 mg by mouth daily.     . Multiple Vitamin (MULTIVITAMIN) tablet Take 1 tablet by mouth daily.    . simvastatin (ZOCOR) 5 MG tablet Take 1 tablet (5 mg total) by mouth daily. 30 tablet 11  . terazosin (HYTRIN) 1 MG capsule Take 1 mg by mouth daily.     . Thiamine HCl (VITAMIN B-1) 250 MG tablet Take 250 mg by mouth daily.    . vitamin B-12  (CYANOCOBALAMIN) 1000 MCG tablet Take 1,000 mcg by mouth daily.    Marland Kitchen levothyroxine (SYNTHROID) 75 MCG tablet Take 1 tablet by mouth daily.     No current facility-administered medications for this visit.    Physical Exam: Vitals:   08/23/20 1150  BP: 102/66  Pulse: 64  SpO2: 97%  Weight: 217 lb (98.4 kg)  Height: 6\' 3"  (1.905 m)    GEN- The patient is well appearing, alert but with chronic cognitive impairment Head- normocephalic, atraumatic Eyes-  Sclera clear, conjunctiva pink Ears- hearing intact Oropharynx- clear Lungs- Clear to ausculation bilaterally, normal work of breathing Chest- ICD pocket is well healed Heart- Regular rate and rhythm, no murmurs, rubs or gallops, PMI not laterally displaced GI- soft, NT, ND, + BS Extremities- no clubbing, cyanosis, or edema  ICD interrogation- reviewed in detail today,  See PACEART report  ekg tracing ordered today is personally reviewed and shows sinus  Wt Readings from Last 3 Encounters:  08/23/20 217 lb (98.4 kg)  01/23/20 218 lb (98.9 kg)  09/19/19 212 lb (96.2 kg)    Assessment and Plan:  1.  Coronary vasospasm/ VF euvolemic today No ischemic symptoms Stable on an appropriate medical regimen Normal ICD function See Pace Art report No changes today he is not device dependant  today   Risks, benefits and potential toxicities for medications prescribed and/or refilled reviewed with patient today.   Return to see EP PA in a year  Hillis Range MD, [facility] 08/23/2020 12:01 PM

## 2020-09-10 ENCOUNTER — Ambulatory Visit (INDEPENDENT_AMBULATORY_CARE_PROVIDER_SITE_OTHER): Payer: Medicare Other | Admitting: *Deleted

## 2020-09-10 DIAGNOSIS — I4901 Ventricular fibrillation: Secondary | ICD-10-CM

## 2020-09-11 LAB — CUP PACEART REMOTE DEVICE CHECK
Battery Remaining Longevity: 24 mo
Battery Remaining Percentage: 20 %
Battery Voltage: 2.77 V
Brady Statistic RV Percent Paced: 1 %
Date Time Interrogation Session: 20210915073807
HighPow Impedance: 77 Ohm
HighPow Impedance: 77 Ohm
Implantable Lead Implant Date: 20110912
Implantable Lead Location: 753860
Implantable Lead Model: 7122
Implantable Pulse Generator Implant Date: 20110912
Lead Channel Impedance Value: 350 Ohm
Lead Channel Pacing Threshold Amplitude: 0.75 V
Lead Channel Pacing Threshold Pulse Width: 0.5 ms
Lead Channel Sensing Intrinsic Amplitude: 7.2 mV
Lead Channel Setting Pacing Amplitude: 2.5 V
Lead Channel Setting Pacing Pulse Width: 0.5 ms
Lead Channel Setting Sensing Sensitivity: 0.5 mV
Pulse Gen Serial Number: 618386

## 2020-09-12 NOTE — Progress Notes (Signed)
Remote ICD transmission.   

## 2020-10-11 DIAGNOSIS — H1789 Other corneal scars and opacities: Secondary | ICD-10-CM | POA: Diagnosis not present

## 2020-10-11 DIAGNOSIS — Z23 Encounter for immunization: Secondary | ICD-10-CM | POA: Diagnosis not present

## 2020-10-11 DIAGNOSIS — H524 Presbyopia: Secondary | ICD-10-CM | POA: Diagnosis not present

## 2020-10-11 DIAGNOSIS — H2513 Age-related nuclear cataract, bilateral: Secondary | ICD-10-CM | POA: Diagnosis not present

## 2020-10-11 DIAGNOSIS — H52202 Unspecified astigmatism, left eye: Secondary | ICD-10-CM | POA: Diagnosis not present

## 2020-11-27 DIAGNOSIS — Z23 Encounter for immunization: Secondary | ICD-10-CM | POA: Diagnosis not present

## 2020-12-10 ENCOUNTER — Ambulatory Visit (INDEPENDENT_AMBULATORY_CARE_PROVIDER_SITE_OTHER): Payer: Medicare Other

## 2020-12-10 DIAGNOSIS — I4901 Ventricular fibrillation: Secondary | ICD-10-CM | POA: Diagnosis not present

## 2020-12-10 LAB — CUP PACEART REMOTE DEVICE CHECK
Battery Remaining Longevity: 22 mo
Battery Remaining Percentage: 18 %
Battery Voltage: 2.75 V
Brady Statistic RV Percent Paced: 1 %
Date Time Interrogation Session: 20211214050526
HighPow Impedance: 74 Ohm
HighPow Impedance: 74 Ohm
Implantable Lead Implant Date: 20110912
Implantable Lead Location: 753860
Implantable Lead Model: 7122
Implantable Pulse Generator Implant Date: 20110912
Lead Channel Impedance Value: 360 Ohm
Lead Channel Pacing Threshold Amplitude: 0.75 V
Lead Channel Pacing Threshold Pulse Width: 0.5 ms
Lead Channel Sensing Intrinsic Amplitude: 7.3 mV
Lead Channel Setting Pacing Amplitude: 2.5 V
Lead Channel Setting Pacing Pulse Width: 0.5 ms
Lead Channel Setting Sensing Sensitivity: 0.5 mV
Pulse Gen Serial Number: 618386

## 2020-12-25 NOTE — Progress Notes (Signed)
Remote ICD transmission.   

## 2021-01-20 DIAGNOSIS — Z125 Encounter for screening for malignant neoplasm of prostate: Secondary | ICD-10-CM | POA: Diagnosis not present

## 2021-01-20 DIAGNOSIS — E538 Deficiency of other specified B group vitamins: Secondary | ICD-10-CM | POA: Diagnosis not present

## 2021-01-20 DIAGNOSIS — I2510 Atherosclerotic heart disease of native coronary artery without angina pectoris: Secondary | ICD-10-CM | POA: Diagnosis not present

## 2021-01-20 DIAGNOSIS — E039 Hypothyroidism, unspecified: Secondary | ICD-10-CM | POA: Diagnosis not present

## 2021-01-20 DIAGNOSIS — E512 Wernicke's encephalopathy: Secondary | ICD-10-CM | POA: Diagnosis not present

## 2021-01-20 DIAGNOSIS — E559 Vitamin D deficiency, unspecified: Secondary | ICD-10-CM | POA: Diagnosis not present

## 2021-01-21 DIAGNOSIS — E512 Wernicke's encephalopathy: Secondary | ICD-10-CM | POA: Diagnosis not present

## 2021-01-21 DIAGNOSIS — R7309 Other abnormal glucose: Secondary | ICD-10-CM | POA: Diagnosis not present

## 2021-01-21 DIAGNOSIS — Z8042 Family history of malignant neoplasm of prostate: Secondary | ICD-10-CM | POA: Diagnosis not present

## 2021-01-21 DIAGNOSIS — I2510 Atherosclerotic heart disease of native coronary artery without angina pectoris: Secondary | ICD-10-CM | POA: Diagnosis not present

## 2021-01-21 DIAGNOSIS — E039 Hypothyroidism, unspecified: Secondary | ICD-10-CM | POA: Diagnosis not present

## 2021-01-21 DIAGNOSIS — Z950 Presence of cardiac pacemaker: Secondary | ICD-10-CM | POA: Diagnosis not present

## 2021-01-21 DIAGNOSIS — N400 Enlarged prostate without lower urinary tract symptoms: Secondary | ICD-10-CM | POA: Diagnosis not present

## 2021-01-21 DIAGNOSIS — E538 Deficiency of other specified B group vitamins: Secondary | ICD-10-CM | POA: Diagnosis not present

## 2021-01-21 DIAGNOSIS — Z1211 Encounter for screening for malignant neoplasm of colon: Secondary | ICD-10-CM | POA: Diagnosis not present

## 2021-01-21 DIAGNOSIS — Z0000 Encounter for general adult medical examination without abnormal findings: Secondary | ICD-10-CM | POA: Diagnosis not present

## 2021-01-21 DIAGNOSIS — E559 Vitamin D deficiency, unspecified: Secondary | ICD-10-CM | POA: Diagnosis not present

## 2021-03-11 ENCOUNTER — Ambulatory Visit (INDEPENDENT_AMBULATORY_CARE_PROVIDER_SITE_OTHER): Payer: Medicare Other

## 2021-03-11 DIAGNOSIS — I4901 Ventricular fibrillation: Secondary | ICD-10-CM

## 2021-03-11 LAB — CUP PACEART REMOTE DEVICE CHECK
Battery Remaining Longevity: 16 mo
Battery Remaining Percentage: 13 %
Battery Voltage: 2.71 V
Brady Statistic RV Percent Paced: 1 %
Date Time Interrogation Session: 20220315063228
HighPow Impedance: 78 Ohm
HighPow Impedance: 78 Ohm
Implantable Lead Implant Date: 20110912
Implantable Lead Location: 753860
Implantable Lead Model: 7122
Implantable Pulse Generator Implant Date: 20110912
Lead Channel Impedance Value: 360 Ohm
Lead Channel Pacing Threshold Amplitude: 0.75 V
Lead Channel Pacing Threshold Pulse Width: 0.5 ms
Lead Channel Sensing Intrinsic Amplitude: 6.9 mV
Lead Channel Setting Pacing Amplitude: 2.5 V
Lead Channel Setting Pacing Pulse Width: 0.5 ms
Lead Channel Setting Sensing Sensitivity: 0.5 mV
Pulse Gen Serial Number: 618386

## 2021-03-19 NOTE — Progress Notes (Signed)
Remote ICD transmission.   

## 2021-06-10 ENCOUNTER — Ambulatory Visit (INDEPENDENT_AMBULATORY_CARE_PROVIDER_SITE_OTHER): Payer: Medicare Other

## 2021-06-10 DIAGNOSIS — I4901 Ventricular fibrillation: Secondary | ICD-10-CM | POA: Diagnosis not present

## 2021-06-10 LAB — CUP PACEART REMOTE DEVICE CHECK
Battery Remaining Longevity: 12 mo
Battery Remaining Percentage: 10 %
Battery Voltage: 2.69 V
Brady Statistic RV Percent Paced: 1 %
Date Time Interrogation Session: 20220614045925
HighPow Impedance: 79 Ohm
HighPow Impedance: 79 Ohm
Implantable Lead Implant Date: 20110912
Implantable Lead Location: 753860
Implantable Lead Model: 7122
Implantable Pulse Generator Implant Date: 20110912
Lead Channel Impedance Value: 350 Ohm
Lead Channel Pacing Threshold Amplitude: 0.75 V
Lead Channel Pacing Threshold Pulse Width: 0.5 ms
Lead Channel Sensing Intrinsic Amplitude: 7.5 mV
Lead Channel Setting Pacing Amplitude: 2.5 V
Lead Channel Setting Pacing Pulse Width: 0.5 ms
Lead Channel Setting Sensing Sensitivity: 0.5 mV
Pulse Gen Serial Number: 618386

## 2021-07-02 NOTE — Progress Notes (Signed)
Remote ICD transmission.   

## 2021-08-01 DIAGNOSIS — E559 Vitamin D deficiency, unspecified: Secondary | ICD-10-CM | POA: Diagnosis not present

## 2021-08-01 DIAGNOSIS — E039 Hypothyroidism, unspecified: Secondary | ICD-10-CM | POA: Diagnosis not present

## 2021-08-01 DIAGNOSIS — E538 Deficiency of other specified B group vitamins: Secondary | ICD-10-CM | POA: Diagnosis not present

## 2021-08-01 DIAGNOSIS — R7309 Other abnormal glucose: Secondary | ICD-10-CM | POA: Diagnosis not present

## 2021-08-01 DIAGNOSIS — I10 Essential (primary) hypertension: Secondary | ICD-10-CM | POA: Diagnosis not present

## 2021-08-01 DIAGNOSIS — Z0000 Encounter for general adult medical examination without abnormal findings: Secondary | ICD-10-CM | POA: Diagnosis not present

## 2021-08-01 DIAGNOSIS — I2510 Atherosclerotic heart disease of native coronary artery without angina pectoris: Secondary | ICD-10-CM | POA: Diagnosis not present

## 2021-08-04 DIAGNOSIS — Z0000 Encounter for general adult medical examination without abnormal findings: Secondary | ICD-10-CM | POA: Diagnosis not present

## 2021-08-04 DIAGNOSIS — I10 Essential (primary) hypertension: Secondary | ICD-10-CM | POA: Diagnosis not present

## 2021-08-20 DIAGNOSIS — E538 Deficiency of other specified B group vitamins: Secondary | ICD-10-CM | POA: Diagnosis not present

## 2021-08-20 DIAGNOSIS — E039 Hypothyroidism, unspecified: Secondary | ICD-10-CM | POA: Diagnosis not present

## 2021-08-20 DIAGNOSIS — E512 Wernicke's encephalopathy: Secondary | ICD-10-CM | POA: Diagnosis not present

## 2021-08-20 DIAGNOSIS — E559 Vitamin D deficiency, unspecified: Secondary | ICD-10-CM | POA: Diagnosis not present

## 2021-08-20 DIAGNOSIS — F419 Anxiety disorder, unspecified: Secondary | ICD-10-CM | POA: Diagnosis not present

## 2021-08-20 DIAGNOSIS — Z125 Encounter for screening for malignant neoplasm of prostate: Secondary | ICD-10-CM | POA: Diagnosis not present

## 2021-08-20 DIAGNOSIS — I2510 Atherosclerotic heart disease of native coronary artery without angina pectoris: Secondary | ICD-10-CM | POA: Diagnosis not present

## 2021-08-20 DIAGNOSIS — Z79899 Other long term (current) drug therapy: Secondary | ICD-10-CM | POA: Diagnosis not present

## 2021-08-20 DIAGNOSIS — R7309 Other abnormal glucose: Secondary | ICD-10-CM | POA: Diagnosis not present

## 2021-09-01 NOTE — Progress Notes (Signed)
Electrophysiology Office Note Date: 09/02/2021  ID:  [name], DOB [birth year], MRN [id number]  PCP: Irena Reichmann, DO Primary Cardiologist: Tonny Bollman, MD Electrophysiologist: Hillis Range, MD   CC: Routine ICD follow-up  Adam Orr is a 55 y.o. male seen today for Hillis Range, MD for routine electrophysiology followup.  Since last being seen in our clinic the patient reports doing very well.  he denies chest pain, palpitations, dyspnea, PND, orthopnea, nausea, vomiting, dizziness, syncope, edema, weight gain, or early satiety. He has not had ICD shocks.   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 (doesn't say where LV goes to)    VF (ventricular fibrillation) (HCC)    arrest   Past Surgical History:  Procedure Laterality Date   ICD implantation     with defibrillation threshold testing. St. Jude    Current Outpatient Medications  Medication Sig Dispense Refill   aspirin 81 MG chewable tablet Chew 81 mg by mouth daily.     Cholecalciferol (VITAMIN D-3) 5000 UNITS TABS Take 5,000 Units by mouth daily.      citalopram (CELEXA) 20 MG tablet Take 1 tablet (20 mg total) by mouth daily.     Divalproex Sodium (DEPAKOTE PO) Take 125 mg by mouth daily.      folic acid (FOLVITE) 1 MG tablet Take 1 tablet (1 mg total) by mouth daily.     levothyroxine (SYNTHROID) 75 MCG tablet Take 1 tablet by mouth daily.     LORazepam (ATIVAN) 0.5 MG tablet Take 0.5 mg by mouth daily.      Multiple Vitamin (MULTIVITAMIN) tablet Take 1 tablet by mouth daily.     simvastatin (ZOCOR) 5 MG tablet Take 1 tablet (5 mg total) by mouth daily. 30 tablet 11   terazosin  (HYTRIN) 1 MG capsule Take 1 mg by mouth daily.      Thiamine HCl (VITAMIN B-1) 250 MG tablet Take 250 mg by mouth daily.     vitamin B-12 (CYANOCOBALAMIN) 1000 MCG tablet Take 1,000 mcg by mouth daily.     No current facility-administered medications for this visit.    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   Vaping Use   Vaping Use: Never used  Substance and Sexual Activity   Alcohol use: Yes    Alcohol/week: 84.0 standard drinks    Types: 84 Cans of beer per week   Drug use: No   Sexual activity: Not on file  Other Topics Concern  Not on file  Social History Narrative   Divorced, 2 children.    Patient lives at home with his mother Malvin Morrish.   Patient was in [employer] for 12 years.    Left-handed.   Social Determinants of Health   Financial Resource Strain: Not on file  Food Insecurity: Not on file  Transportation Needs: Not on file  Physical Activity: Not on file  Stress: Not on file  Social Connections: Not on file  Intimate Partner Violence: Not on file    Family History: Family History  Problem Relation Age of Onset   Coronary artery disease Mother    Prostate cancer Father    Prostate cancer Paternal Grandfather     Review of Systems: All other systems reviewed and are otherwise negative except as noted above.   Physical Exam: Vitals:   09/02/21 1120  BP: 110/64  Pulse: 62   SpO2: 96%  Weight: 224 lb (101.6 kg)  Height: 6\' 3"  (1.905 m)     GEN- The patient is well appearing, alert and oriented x 3 today.   HEENT: normocephalic, atraumatic; sclera clear, conjunctiva pink; hearing intact; oropharynx clear; neck supple, no JVP Lymph- no cervical lymphadenopathy Lungs- Clear to ausculation bilaterally, normal work of breathing.  No wheezes, rales, rhonchi Heart- Regular rate and rhythm, no murmurs, rubs or gallops, PMI not laterally displaced GI- soft, non-tender, non-distended, bowel sounds present, no hepatosplenomegaly Extremities- no clubbing or cyanosis. No edema; DP/PT/radial pulses 2+ bilaterally MS- no significant deformity or atrophy Skin- warm and dry, no rash or lesion; ICD pocket well healed Psych- euthymic mood, full affect Neuro- strength and sensation are intact  ICD interrogation- reviewed in detail today,  See PACEART report  EKG:  EKG is ordered today. Personal review of EKG ordered today shows NSR at 62 bpm.  Recent Labs: No results found for requested labs within last 8760 hours.   Wt Readings from Last 3 Encounters:  09/02/21 224 lb (101.6 kg)  08/23/20 217 lb (98.4 kg)  01/23/20 218 lb (98.9 kg)     Other studies Reviewed: Additional studies/ records that were reviewed today include: Previous EP office notes   Assessment and Plan:  1. Secondary prevention VF s/p St. Jude single chamber ICD  euvolemic today Stable on an appropriate medical regimen Normal ICD function See Pace Art report No changes today 10-11 months to ERI. Will update to monthly battery checks.  2. Coronary Vasospasm and Non obstructive CAD No chest pain. Continue ASA and statin   Current medicines are reviewed at length with the patient today.   The patient does not have concerns regarding his medicines.  The following changes were made today:  none  Labs/ tests ordered today include:  Had labs at PCP within past 3-4 weeks. Will  request.  Disposition:   Follow up with Dr. 01/25/20  07/2022. Ideally will catch him right as he enters RRT. Will update Echo next spring/summer.    08/2022, PA-C  09/02/2021 11:44 AM  [facility] [address] [place] [phone] (office) [phone] (fax)

## 2021-09-02 ENCOUNTER — Ambulatory Visit: Payer: Medicare Other | Admitting: Physician Assistant

## 2021-09-02 ENCOUNTER — Ambulatory Visit (INDEPENDENT_AMBULATORY_CARE_PROVIDER_SITE_OTHER): Payer: Medicare Other | Admitting: Student

## 2021-09-02 ENCOUNTER — Other Ambulatory Visit: Payer: Self-pay

## 2021-09-02 ENCOUNTER — Encounter: Payer: Self-pay | Admitting: Student

## 2021-09-02 VITALS — BP 110/64 | HR 62 | Ht 75.0 in | Wt 224.0 lb

## 2021-09-02 DIAGNOSIS — I4901 Ventricular fibrillation: Secondary | ICD-10-CM | POA: Diagnosis not present

## 2021-09-02 DIAGNOSIS — I201 Angina pectoris with documented spasm: Secondary | ICD-10-CM | POA: Diagnosis not present

## 2021-09-02 LAB — CUP PACEART INCLINIC DEVICE CHECK
Battery Remaining Longevity: 11 mo
Brady Statistic RV Percent Paced: 0 %
Date Time Interrogation Session: 20220906114225
HighPow Impedance: 68.625
Implantable Lead Implant Date: 20110912
Implantable Lead Location: 753860
Implantable Lead Model: 7122
Implantable Pulse Generator Implant Date: 20110912
Lead Channel Impedance Value: 350 Ohm
Lead Channel Pacing Threshold Amplitude: 0.75 V
Lead Channel Pacing Threshold Amplitude: 0.75 V
Lead Channel Pacing Threshold Pulse Width: 0.5 ms
Lead Channel Pacing Threshold Pulse Width: 0.5 ms
Lead Channel Sensing Intrinsic Amplitude: 9.1 mV
Lead Channel Setting Pacing Amplitude: 2.5 V
Lead Channel Setting Pacing Pulse Width: 0.5 ms
Lead Channel Setting Sensing Sensitivity: 0.5 mV
Pulse Gen Serial Number: 618386

## 2021-09-02 NOTE — Patient Instructions (Signed)
Medication Instructions:  Your physician recommends that you continue on your current medications as directed. Please refer to the Current Medication list given to you today.  *If you need a refill on your cardiac medications before your next appointment, please call your pharmacy*   Lab Work: None If you have labs (blood work) drawn today and your tests are completely normal, you will receive your results only by: MyChart Message (if you have MyChart) OR A paper copy in the mail If you have any lab test that is abnormal or we need to change your treatment, we will call you to review the results.   Testing/Procedures: Your physician has requested that you have an echocardiogram. Echocardiography is a painless test that uses sound waves to create images of your heart. It provides your doctor with information about the size and shape of your heart and how well your heart's chambers and valves are working. This procedure takes approximately one hour. There are no restrictions for this procedure.   Follow-Up: At [facility], you and your health needs are our priority.  As part of our continuing mission to provide you with exceptional heart care, we have created designated Provider Care Teams.  These Care Teams include your primary Cardiologist (physician) and Advanced Practice Providers (APPs -  Physician Assistants and Nurse Practitioners) who all work together to provide you with the care you need, when you need it.  We recommend signing up for the patient portal called "MyChart".  Sign up information is provided on this After Visit Summary.  MyChart is used to connect with patients for Virtual Visits (Telemedicine).  Patients are able to view lab/test results, encounter notes, upcoming appointments, etc.  Non-urgent messages can be sent to your provider as well.   To learn more about what you can do with MyChart, go to [email or web address].    Your next appointment:   August 2023 with  Dr Johney Frame

## 2021-09-09 ENCOUNTER — Ambulatory Visit (INDEPENDENT_AMBULATORY_CARE_PROVIDER_SITE_OTHER): Payer: Medicare Other

## 2021-09-09 DIAGNOSIS — I4901 Ventricular fibrillation: Secondary | ICD-10-CM

## 2021-09-10 LAB — CUP PACEART REMOTE DEVICE CHECK
Battery Remaining Longevity: 11 mo
Battery Remaining Percentage: 9 %
Battery Voltage: 2.66 V
Brady Statistic RV Percent Paced: 1 %
Date Time Interrogation Session: 20220913092718
HighPow Impedance: 75 Ohm
HighPow Impedance: 75 Ohm
Implantable Lead Implant Date: 20110912
Implantable Lead Location: 753860
Implantable Lead Model: 7122
Implantable Pulse Generator Implant Date: 20110912
Lead Channel Impedance Value: 350 Ohm
Lead Channel Pacing Threshold Amplitude: 0.75 V
Lead Channel Pacing Threshold Pulse Width: 0.5 ms
Lead Channel Sensing Intrinsic Amplitude: 6.4 mV
Lead Channel Setting Pacing Amplitude: 2.5 V
Lead Channel Setting Pacing Pulse Width: 0.5 ms
Lead Channel Setting Sensing Sensitivity: 0.5 mV
Pulse Gen Serial Number: 618386

## 2021-09-17 NOTE — Progress Notes (Signed)
Remote ICD transmission.   

## 2021-09-18 DIAGNOSIS — Z23 Encounter for immunization: Secondary | ICD-10-CM | POA: Diagnosis not present

## 2021-10-10 ENCOUNTER — Ambulatory Visit (INDEPENDENT_AMBULATORY_CARE_PROVIDER_SITE_OTHER): Payer: Medicare Other

## 2021-10-10 DIAGNOSIS — I4901 Ventricular fibrillation: Secondary | ICD-10-CM

## 2021-10-13 LAB — CUP PACEART REMOTE DEVICE CHECK
Battery Remaining Longevity: 9 mo
Battery Remaining Percentage: 7 %
Battery Voltage: 2.66 V
Brady Statistic RV Percent Paced: 1 %
Date Time Interrogation Session: 20221014033644
HighPow Impedance: 81 Ohm
HighPow Impedance: 81 Ohm
Implantable Lead Implant Date: 20110912
Implantable Lead Location: 753860
Implantable Lead Model: 7122
Implantable Pulse Generator Implant Date: 20110912
Lead Channel Impedance Value: 360 Ohm
Lead Channel Pacing Threshold Amplitude: 0.75 V
Lead Channel Pacing Threshold Pulse Width: 0.5 ms
Lead Channel Sensing Intrinsic Amplitude: 6.6 mV
Lead Channel Setting Pacing Amplitude: 2.5 V
Lead Channel Setting Pacing Pulse Width: 0.5 ms
Lead Channel Setting Sensing Sensitivity: 0.5 mV
Pulse Gen Serial Number: 618386

## 2021-10-17 NOTE — Addendum Note (Signed)
Addended by: Elease Etienne A on: 10/17/2021 06:03 PM   Modules accepted: Level of Service

## 2021-10-17 NOTE — Progress Notes (Signed)
Remote ICD transmission.   

## 2021-11-10 ENCOUNTER — Ambulatory Visit (INDEPENDENT_AMBULATORY_CARE_PROVIDER_SITE_OTHER): Payer: Medicare Other

## 2021-11-10 DIAGNOSIS — I4901 Ventricular fibrillation: Secondary | ICD-10-CM

## 2021-11-13 LAB — CUP PACEART REMOTE DEVICE CHECK
Battery Remaining Longevity: 7 mo
Battery Remaining Percentage: 6 %
Battery Voltage: 2.63 V
Brady Statistic RV Percent Paced: 1 %
Date Time Interrogation Session: 20221116183526
HighPow Impedance: 77 Ohm
HighPow Impedance: 77 Ohm
Implantable Lead Implant Date: 20110912
Implantable Lead Location: 753860
Implantable Lead Model: 7122
Implantable Pulse Generator Implant Date: 20110912
Lead Channel Impedance Value: 350 Ohm
Lead Channel Pacing Threshold Amplitude: 0.75 V
Lead Channel Pacing Threshold Pulse Width: 0.5 ms
Lead Channel Sensing Intrinsic Amplitude: 6.3 mV
Lead Channel Setting Pacing Amplitude: 2.5 V
Lead Channel Setting Pacing Pulse Width: 0.5 ms
Lead Channel Setting Sensing Sensitivity: 0.5 mV
Pulse Gen Serial Number: 618386

## 2021-11-18 NOTE — Addendum Note (Signed)
Addended by: Elease Etienne A on: 11/18/2021 09:15 AM   Modules accepted: Level of Service

## 2021-11-18 NOTE — Progress Notes (Signed)
Remote ICD transmission.   

## 2021-11-23 ENCOUNTER — Emergency Department: Payer: No Typology Code available for payment source

## 2021-11-23 ENCOUNTER — Inpatient Hospital Stay
Admission: EM | Admit: 2021-11-23 | Discharge: 2021-12-02 | DRG: 236 | Disposition: A | Discharge status: Home Health | Payer: No Typology Code available for payment source | Attending: Thoracic Surgery (Cardiothoracic Vascular Surgery) | Admitting: Thoracic Surgery (Cardiothoracic Vascular Surgery)

## 2021-11-23 ENCOUNTER — Inpatient Hospital Stay: Payer: No Typology Code available for payment source

## 2021-11-23 DIAGNOSIS — Z955 Presence of coronary angioplasty implant and graft: Secondary | ICD-10-CM

## 2021-11-23 DIAGNOSIS — R079 Chest pain, unspecified: Secondary | ICD-10-CM

## 2021-11-23 DIAGNOSIS — Z20822 Contact with and (suspected) exposure to covid-19: Secondary | ICD-10-CM | POA: Diagnosis present

## 2021-11-23 DIAGNOSIS — Z87891 Personal history of nicotine dependence: Secondary | ICD-10-CM

## 2021-11-23 DIAGNOSIS — Z79899 Other long term (current) drug therapy: Secondary | ICD-10-CM

## 2021-11-23 DIAGNOSIS — I25110 Atherosclerotic heart disease of native coronary artery with unstable angina pectoris: Principal | ICD-10-CM | POA: Diagnosis present

## 2021-11-23 DIAGNOSIS — J939 Pneumothorax, unspecified: Secondary | ICD-10-CM | POA: Diagnosis not present

## 2021-11-23 DIAGNOSIS — I200 Unstable angina: Principal | ICD-10-CM | POA: Diagnosis present

## 2021-11-23 DIAGNOSIS — F064 Anxiety disorder due to known physiological condition: Secondary | ICD-10-CM | POA: Diagnosis not present

## 2021-11-23 DIAGNOSIS — I471 Supraventricular tachycardia: Secondary | ICD-10-CM | POA: Diagnosis not present

## 2021-11-23 DIAGNOSIS — I2510 Atherosclerotic heart disease of native coronary artery without angina pectoris: Secondary | ICD-10-CM | POA: Diagnosis present

## 2021-11-23 DIAGNOSIS — I209 Angina pectoris, unspecified: Secondary | ICD-10-CM

## 2021-11-23 DIAGNOSIS — Z7982 Long term (current) use of aspirin: Secondary | ICD-10-CM

## 2021-11-23 DIAGNOSIS — F1010 Alcohol abuse, uncomplicated: Secondary | ICD-10-CM | POA: Diagnosis present

## 2021-11-23 DIAGNOSIS — Z6828 Body mass index (BMI) 28.0-28.9, adult: Secondary | ICD-10-CM

## 2021-11-23 DIAGNOSIS — I10 Essential (primary) hypertension: Secondary | ICD-10-CM | POA: Diagnosis present

## 2021-11-23 DIAGNOSIS — E861 Hypovolemia: Secondary | ICD-10-CM | POA: Diagnosis not present

## 2021-11-23 DIAGNOSIS — R739 Hyperglycemia, unspecified: Secondary | ICD-10-CM | POA: Diagnosis not present

## 2021-11-23 DIAGNOSIS — I252 Old myocardial infarction: Secondary | ICD-10-CM

## 2021-11-23 DIAGNOSIS — Z01818 Encounter for other preprocedural examination: Secondary | ICD-10-CM

## 2021-11-23 DIAGNOSIS — D62 Acute posthemorrhagic anemia: Secondary | ICD-10-CM | POA: Diagnosis not present

## 2021-11-23 DIAGNOSIS — E8770 Fluid overload, unspecified: Secondary | ICD-10-CM | POA: Diagnosis not present

## 2021-11-23 DIAGNOSIS — E785 Hyperlipidemia, unspecified: Secondary | ICD-10-CM | POA: Diagnosis present

## 2021-11-23 DIAGNOSIS — R339 Retention of urine, unspecified: Secondary | ICD-10-CM | POA: Diagnosis not present

## 2021-11-23 LAB — COMPREHENSIVE METABOLIC PANEL
ALT: 42 U/L (ref 0–55)
AST (SGOT): 41 U/L (ref 5–41)
Albumin/Globulin Ratio: 1.3 (ref 0.9–2.2)
Albumin: 4 g/dL (ref 3.5–5.0)
Alkaline Phosphatase: 48 U/L (ref 37–117)
Anion Gap: 9 (ref 5.0–15.0)
BUN: 17 mg/dL (ref 9.0–28.0)
Bilirubin, Total: 0.8 mg/dL (ref 0.2–1.2)
CO2: 21 mEq/L (ref 17–29)
Calcium: 8.9 mg/dL (ref 8.5–10.5)
Chloride: 105 mEq/L (ref 99–111)
Creatinine: 1 mg/dL (ref 0.5–1.5)
Globulin: 3 g/dL (ref 2.0–3.6)
Glucose: 116 mg/dL — ABNORMAL HIGH (ref 70–100)
Potassium: 4.1 mEq/L (ref 3.5–5.3)
Protein, Total: 7 g/dL (ref 6.0–8.3)
Sodium: 135 mEq/L (ref 135–145)

## 2021-11-23 LAB — CBC AND DIFFERENTIAL
Absolute NRBC: 0 10*3/uL (ref 0.00–0.00)
Basophils Absolute Automated: 0.02 10*3/uL (ref 0.00–0.08)
Basophils Automated: 0.2 %
Eosinophils Absolute Automated: 0.06 10*3/uL (ref 0.00–0.44)
Eosinophils Automated: 0.7 %
Hematocrit: 42.4 % (ref 37.6–49.6)
Hgb: 15.1 g/dL (ref 12.5–17.1)
Immature Granulocytes Absolute: 0.02 10*3/uL (ref 0.00–0.07)
Immature Granulocytes: 0.2 %
Lymphocytes Absolute Automated: 0.58 10*3/uL (ref 0.42–3.22)
Lymphocytes Automated: 6.7 %
MCH: 30.9 pg (ref 25.1–33.5)
MCHC: 35.6 g/dL (ref 31.5–35.8)
MCV: 86.7 fL (ref 78.0–96.0)
MPV: 10.2 fL (ref 8.9–12.5)
Monocytes Absolute Automated: 0.67 10*3/uL (ref 0.21–0.85)
Monocytes: 7.8 %
Neutrophils Absolute: 7.27 10*3/uL — ABNORMAL HIGH (ref 1.10–6.33)
Neutrophils: 84.4 %
Nucleated RBC: 0 /100 WBC (ref 0.0–0.0)
Platelets: 201 10*3/uL (ref 142–346)
RBC: 4.89 10*6/uL (ref 4.20–5.90)
RDW: 12 % (ref 11–15)
WBC: 8.62 10*3/uL (ref 3.10–9.50)

## 2021-11-23 LAB — ECG 12-LEAD
Atrial Rate: 61 {beats}/min
Atrial Rate: 89 {beats}/min
IHS MUSE NARRATIVE AND IMPRESSION: NORMAL
IHS MUSE NARRATIVE AND IMPRESSION: NORMAL
P Axis: 18 degrees
P Axis: 41 degrees
P-R Interval: 132 ms
P-R Interval: 140 ms
Q-T Interval: 384 ms
Q-T Interval: 352 ms
QRS Duration: 86 ms
QRS Duration: 82 ms
QTC Calculation (Bezet): 386 ms
QTC Calculation (Bezet): 428 ms
R Axis: -39 degrees
R Axis: -50 degrees
T Axis: 1 degrees
T Axis: 24 degrees
Ventricular Rate: 61 {beats}/min
Ventricular Rate: 89 {beats}/min

## 2021-11-23 LAB — ANTI-XA,UFH
Anti-Xa, UFH: 0.06 IU/mL
Anti-Xa, UFH: 0.55 IU/mL

## 2021-11-23 LAB — GFR: EGFR: >60

## 2021-11-23 LAB — TROPONIN I
Troponin I: <0.01 ng/mL (ref 0.00–0.05)
Troponin I: <0.01 ng/mL (ref 0.00–0.05)
Troponin I: <0.01 ng/mL (ref 0.00–0.05)

## 2021-11-23 LAB — APTT: PTT: 30 s (ref 27–39)

## 2021-11-23 MED ORDER — LORAZEPAM 0.5 MG PO TABS
0.2500 mg | ORAL_TABLET | Freq: Three times a day (TID) | ORAL | Status: DC | PRN
Start: 2021-11-23 — End: 2021-11-25

## 2021-11-23 MED ORDER — ACETAMINOPHEN 650 MG RE SUPP
650.0000 mg | Freq: Four times a day (QID) | RECTAL | Status: DC | PRN
Start: 2021-11-23 — End: 2021-11-25

## 2021-11-23 MED ORDER — NITROGLYCERIN 0.4 MG SL SUBL
0.4000 mg | SUBLINGUAL_TABLET | SUBLINGUAL | Status: DC | PRN
Start: 2021-11-23 — End: 2021-11-25
  Administered 2021-11-23: 0.4 mg via SUBLINGUAL
  Filled 2021-11-23: qty 25

## 2021-11-23 MED ORDER — ONDANSETRON HCL 4 MG/2ML IJ SOLN
4.0000 mg | Freq: Once | INTRAMUSCULAR | Status: AC
Start: 2021-11-23 — End: 2021-11-23
  Administered 2021-11-23: 4 mg via INTRAVENOUS
  Filled 2021-11-23: qty 2

## 2021-11-23 MED ORDER — POTASSIUM CHLORIDE CRYS ER 20 MEQ PO TBCR
0.0000 meq | EXTENDED_RELEASE_TABLET | ORAL | Status: DC | PRN
Start: 2021-11-23 — End: 2021-11-25

## 2021-11-23 MED ORDER — HEPARIN SODIUM (PORCINE) 5000 UNIT/ML IJ SOLN
3000.0000 [IU] | INTRAMUSCULAR | Status: DC | PRN
Start: 2021-11-23 — End: 2021-11-25

## 2021-11-23 MED ORDER — ONDANSETRON HCL 4 MG/2ML IJ SOLN
4.0000 mg | Freq: Four times a day (QID) | INTRAMUSCULAR | Status: DC | PRN
Start: 2021-11-23 — End: 2021-11-25

## 2021-11-23 MED ORDER — ATORVASTATIN CALCIUM 40 MG PO TABS
40.0000 mg | ORAL_TABLET | Freq: Every evening | ORAL | Status: DC
Start: 2021-11-24 — End: 2021-11-24
  Administered 2021-11-24: 40 mg via ORAL
  Filled 2021-11-23: qty 1

## 2021-11-23 MED ORDER — DEXTROSE 50 % IV SOLN
25.0000 g | INTRAVENOUS | Status: DC | PRN
Start: 2021-11-23 — End: 2021-11-25

## 2021-11-23 MED ORDER — MORPHINE SULFATE 2 MG/ML IJ/IV SOLN (WRAP)
1.0000 mg | Status: DC | PRN
Start: 2021-11-23 — End: 2021-11-25

## 2021-11-23 MED ORDER — DEXTROSE 5% IV BOLUS
250.0000 mL | INTRAVENOUS | Status: DC | PRN
Start: 2021-11-23 — End: 2021-11-25

## 2021-11-23 MED ORDER — ACETAMINOPHEN 325 MG PO TABS
650.0000 mg | ORAL_TABLET | Freq: Four times a day (QID) | ORAL | Status: DC | PRN
Start: 2021-11-23 — End: 2021-11-25

## 2021-11-23 MED ORDER — MAGNESIUM SULFATE IN D5W 1-5 GM/100ML-% IV SOLN
1.0000 g | INTRAVENOUS | Status: DC | PRN
Start: 2021-11-23 — End: 2021-11-25
  Administered 2021-11-24 (×2): 1 g via INTRAVENOUS
  Filled 2021-11-23: qty 100

## 2021-11-23 MED ORDER — FAMOTIDINE 20 MG PO TABS
20.0000 mg | ORAL_TABLET | Freq: Two times a day (BID) | ORAL | Status: DC
Start: 2021-11-23 — End: 2021-11-25
  Administered 2021-11-23 – 2021-11-25 (×4): 20 mg via ORAL
  Filled 2021-11-23 (×5): qty 1

## 2021-11-23 MED ORDER — GLUCAGON 1 MG IJ SOLR (WRAP)
1.0000 mg | INTRAMUSCULAR | Status: DC | PRN
Start: 2021-11-23 — End: 2021-11-25

## 2021-11-23 MED ORDER — DEXTROSE 10 % IV BOLUS
25.0000 g | INTRAVENOUS | Status: DC | PRN
Start: 2021-11-23 — End: 2021-11-25

## 2021-11-23 MED ORDER — ASPIRIN 81 MG PO TBEC
81.0000 mg | DELAYED_RELEASE_TABLET | Freq: Every day | ORAL | Status: DC
Start: 2021-11-24 — End: 2021-11-25
  Administered 2021-11-24 – 2021-11-25 (×2): 81 mg via ORAL
  Filled 2021-11-23 (×2): qty 1

## 2021-11-23 MED ORDER — ATORVASTATIN CALCIUM 40 MG PO TABS
40.0000 mg | ORAL_TABLET | Freq: Every evening | ORAL | Status: DC
Start: 2021-11-24 — End: 2021-11-23

## 2021-11-23 MED ORDER — ONDANSETRON 4 MG PO TBDP
4.0000 mg | ORAL_TABLET | Freq: Four times a day (QID) | ORAL | Status: DC | PRN
Start: 2021-11-23 — End: 2021-11-25

## 2021-11-23 MED ORDER — POTASSIUM & SODIUM PHOSPHATES 280-160-250 MG PO PACK
2.0000 | PACK | ORAL | Status: DC | PRN
Start: 2021-11-23 — End: 2021-11-25

## 2021-11-23 MED ORDER — METOPROLOL TARTRATE 25 MG PO TABS
25.0000 mg | ORAL_TABLET | Freq: Two times a day (BID) | ORAL | Status: DC
Start: 2021-11-24 — End: 2021-11-25
  Administered 2021-11-24 – 2021-11-25 (×3): 25 mg via ORAL
  Filled 2021-11-23 (×4): qty 1

## 2021-11-23 MED ORDER — MELATONIN 3 MG PO TABS
3.0000 mg | ORAL_TABLET | Freq: Every evening | ORAL | Status: DC | PRN
Start: 2021-11-23 — End: 2021-11-25

## 2021-11-23 MED ORDER — FAMOTIDINE 10 MG/ML IV SOLN (WRAP)
20.0000 mg | Freq: Two times a day (BID) | INTRAVENOUS | Status: DC
Start: 2021-11-23 — End: 2021-11-25

## 2021-11-23 MED ORDER — RANOLAZINE ER 500 MG PO TB12
1000.0000 mg | ORAL_TABLET | Freq: Two times a day (BID) | ORAL | Status: DC
Start: 2021-11-24 — End: 2021-11-25
  Administered 2021-11-24 – 2021-11-25 (×4): 1000 mg via ORAL
  Filled 2021-11-23 (×4): qty 2

## 2021-11-23 MED ORDER — NALOXONE HCL 0.4 MG/ML IJ SOLN (WRAP)
0.2000 mg | INTRAMUSCULAR | Status: DC | PRN
Start: 2021-11-23 — End: 2021-11-25

## 2021-11-23 MED ORDER — POTASSIUM CHLORIDE 10 MEQ/100ML IV SOLN
10.0000 meq | INTRAVENOUS | Status: DC | PRN
Start: 2021-11-23 — End: 2021-11-25

## 2021-11-23 MED ORDER — HEPARIN (PORCINE) IN D5W 50-5 UNIT/ML-% IV SOLN (UNITS/KG/HR ONLY)
11.3000 [IU]/kg/h | INTRAVENOUS | Status: DC
Start: 2021-11-23 — End: 2021-11-25
  Administered 2021-11-23: 11.3 [IU]/kg/h via INTRAVENOUS
  Administered 2021-11-24: 10.3 [IU]/kg/h via INTRAVENOUS
  Filled 2021-11-23 (×3): qty 500

## 2021-11-23 MED ORDER — HEPARIN SODIUM (PORCINE) 5000 UNIT/ML IJ SOLN
4000.0000 [IU] | Freq: Once | INTRAMUSCULAR | Status: AC
Start: 2021-11-23 — End: 2021-11-23
  Administered 2021-11-23: 4000 [IU] via INTRAVENOUS
  Filled 2021-11-23 (×2): qty 1

## 2021-11-23 NOTE — ED Notes (Signed)
[facility] EMERGENCY [facility]PT  ED NURSING NOTE FOR THE RECEIVING INPATIENT NURSE   ED NURSE [place] [id number]   ED CHARGE RN [phone]   ADMISSION INFORMATION   Barnabas Henriques is a 55 y.o. male admitted with an ED diagnosis of:    1. Unstable angina         Isolation: None   Allergies: Patient has no known allergies.   Holding Orders confirmed? No   Belongings Documented? Yes   Home medications sent to pharmacy confirmed? N/A   NURSING CARE   Patient Comes From:   Mental Status: Home Independent  alert   ADL: Independent with all ADLs   Ambulation: no difficulty   Pertinent Information  and Safety Concerns:     Broset Violence Risk Level: Low Pt. Is on a heparin drip.      CT / NIH   CT Head ordered on this patient?  No   NIH/Dysphagia assessment done prior to admission? No   VITAL SIGNS (at the time of this note)      Vitals:    11/23/21 1125   BP: 141/80   Pulse: 64   Resp: 18   Temp:    SpO2: 95%   Temp: 97.5

## 2021-11-23 NOTE — ED Notes (Signed)
RN tried to call report; accepting RN on break. RN will re attempt

## 2021-11-23 NOTE — Plan of Care (Signed)
Cardiac symptoms: Denies  Diet: Heart Healthy  Fluid Restriction? N/A  Mobility: Standby      Nursing Plan:    CABG Eval  Monitor HR & Rhythm  Problem: Pain  Goal: Pain at adequate level as identified by patient  Outcome: Progressing     Problem: Side Effects from Pain Analgesia  Goal: Patient will experience minimal side effects of analgesic therapy  Outcome: Progressing     Problem: Discharge Barriers  Goal: Patient will be discharged home or other facility with appropriate resources  Outcome: Progressing     Problem: Psychosocial and Spiritual Needs  Goal: Demonstrates ability to cope with hospitalization/illness  Outcome: Progressing

## 2021-11-23 NOTE — ED Notes (Signed)
Pt. Complains of an increase in chest pain and nausea. RN has made ED provider aware.

## 2021-11-23 NOTE — Progress Notes (Signed)
2 staff member skin assessment (within 4 hours of admission) completed with: Meghan RN  CHG bath: Completed  Clinical cytogeneticist signed by patient/staff: Completed  Admission packet reviewed with patient/family: Completed  Reviewed patient's belongings and home medications: Completed   Surveillance MRSA culture (SNF/Rehab/History/Admitted from OSH/Chronic HD): N/A  Present central line/foley evaluated: N/A  Arm bands applied: Yes  Interpreter request or waiver completed: N/A  COVID19 Assessment complete:Yes      11/23/21 1500   Integumentary   Integumentary (WDL) X   General Skin Color Appropriate for ethnicity   Skin Condition/Temp Warm;Dry   Skin Integrity Blanchable Redness   Blanchable Redness Location Sacrum

## 2021-11-23 NOTE — ED Notes (Signed)
RN holding heparin at this time per patient request and patient stated, "The cardiologist that came by the bedside said we were not going to do heparin right now." RN made ED provider aware and will get clarification.

## 2021-11-23 NOTE — ED Notes (Signed)
Pt. Chest pain relieved with one nitro tab; EKG being completed

## 2021-11-23 NOTE — ED Notes (Signed)
Bed: N K  Expected date:   Expected time:   Means of arrival: [id number] [place]  Comments:

## 2021-11-23 NOTE — ED Notes (Signed)
Pt. Denies chest pain at this time; awaiting inpatient admission

## 2021-11-23 NOTE — Plan of Care (Signed)
Cross Cover Note:    Home meds ordered for today

## 2021-11-23 NOTE — Plan of Care (Signed)
Cardiac symptoms: denies CP  Diet: cardiac  Fluid Restriction: none ordered  Mobility: standby      Nursing Plan:   -Continue Heparin gtt; next time to check anti-Xa at noon  -Echo ordered  -[facility] consult 11/28    Problem: Pain  Goal: Pain at adequate level as identified by patient  Outcome: Progressing  Flowsheets (Taken 11/23/2021 2203)  Pain at adequate level as identified by patient:   Identify patient comfort function goal   Assess pain on admission, during daily assessment and/or before any "as needed" intervention(s)   Reassess pain within 30-60 minutes of any procedure/intervention, per Pain Assessment, Intervention, Reassessment (AIR) Cycle   Evaluate if patient comfort function goal is met   Evaluate patient's satisfaction with pain management progress     Problem: Moderate/High Fall Risk Score >5  Goal: Patient will remain free of falls  Outcome: Progressing  Flowsheets (Taken 11/23/2021 2203)  Moderate Risk (6-13):   MOD-Perform dangle, stand, walk (DSW) prior to mobilization   MOD-Remain with patient during toileting     Problem: Pre-op Phase - Cardiac Surgery  Goal: Patient will be ready for surgery  Outcome: Progressing  Flowsheets (Taken 11/23/2021 2204)  Patient will be ready for surgery: Document height and weight

## 2021-11-23 NOTE — ED Provider Notes (Signed)
[facility] EMERGENCY DEPARTMENT H&P         CLINICAL SUMMARY          Diagnosis:    .     Final diagnoses:   Unstable angina         MDM Notes:      Preliminary:  #Chest pain awakening him from sleep radiating to the neck associated with diaphoresis.  Improved with sublingual nitroglycerin 1 out of 10 upon arrival in ED EKG without changes troponin initial troponin is negative.  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  Discussion with cardiology in light of his unstable angina symptoms recommending that he come into the hospital and completed CABG evaluation and referral in the inpatient setting  Differential Diagnosis:  1) ACS with stuttering symptoms  2)   3)   4)     Assessment and Plan:  ACS medical management admit cardiac telemetry inpatient.  Cardiology consulting.  Cardiovascular surgery to be consulted regarding bypass    Summary:   As above patient and family updated admit inpatient cardiac telemetry      Disposition:         Admit              CLINICAL INFORMATION        HPI:      Chief Complaint: Chest Pain  .    Raymond Cook is a 55 y.o. male with PMHx of NSTEMI, CAD w/ stent placed, HLD, who presents with chest pain that awoke him from sleep at 04 30.  Patient has been having a month of progressive breathlessness on exertion which prompted an outpatient stress test found to be abnormal had a cardiac cath 2 weeks ago demonstrating multivessel disease he is awaiting cardiothoracic surgery consultation scheduled for December 14.  He has occasionally been having chest pain symptoms for which he was prescribed nitroglycerin.  He states is not the first episode of chest pain at rest although it is noted that this chest pain awoke him from sleep radiating to the neck associate with diaphoresis.  He experienced the onset at 430 took nitroglycerin and went back to sleep awoke at 6 AM with nausea discussion  with his wife prompted 911 call as the nausea was felt to possibly be related to underlying coronary occlusive disease.    He took 4 baby aspirin at 7 AM with associated symptoms.  He was given 2 baby aspirin by EMS.  He took his own nitroglycerin and was given nitroglycerin in route.  He is presently 0.5-1 out of 10 discomfort    History obtained from: patient, family, review of prior chart      ROS:      Positive and negative ROS elements as per HPI.  All other systems reviewed and negative.      Physical Exam:      Pulse 63  BP 160/89  Resp 18  SpO2 95 %  Temp 97.5 F (36.4 C)    Physical Exam  Vitals and nursing note reviewed.   Constitutional:       General: He is not in acute distress.     Appearance: He is well-developed. He is not diaphoretic.   HENT:      Head: Normocephalic and atraumatic.      Right Ear: External ear normal.      Left Ear: External ear normal.   Eyes:      Conjunctiva/sclera: Conjunctivae  normal.      Pupils: Pupils are equal, round, and reactive to light.   Neck:      Thyroid: No thyromegaly.      Trachea: No tracheal deviation.   Cardiovascular:      Rate and Rhythm: Normal rate and regular rhythm.      Heart sounds: Normal heart sounds. No murmur heard.    No friction rub. No gallop.   Pulmonary:      Effort: Pulmonary effort is normal. No respiratory distress.      Breath sounds: Normal breath sounds. No stridor. No wheezing or rales.   Abdominal:      General: There is no distension.      Palpations: Abdomen is soft.      Tenderness: There is no abdominal tenderness. There is no guarding or rebound.   Musculoskeletal:         General: No tenderness. Normal range of motion.      Cervical back: Normal range of motion and neck supple.   Skin:     General: Skin is warm and dry.      Findings: No erythema or rash.   Neurological:      Mental Status: He is alert and oriented to person, place, and time.      Cranial Nerves: No cranial nerve deficit.      Coordination: Coordination  normal.   Psychiatric:         Behavior: Behavior normal.         Thought Content: Thought content normal.         Judgment: Judgment normal.                 PAST HISTORY        Primary Care Provider: Estella Husk, DO        PMH/PSH:    .     Past Medical History:   Diagnosis Date    Coronary artery disease     Hyperlipidemia     NSTEMI (non-ST elevated myocardial infarction)     SOB (shortness of breath) on exertion     Stented coronary artery        He has a past surgical history that includes Coronary angioplasty.      Social/Family History:      He reports that he has quit smoking. His smoking use included cigarettes. He has a 21.00 pack-year smoking history. He has never used smokeless tobacco. He reports current alcohol use of about 2.0 standard drinks per week. He reports that he does not use drugs.    Family History   Problem Relation Age of Onset    Breast cancer Mother     Diabetes Father         type I    Leukemia Father          Listed Medications on Arrival:    .     Previous Medications    ASPIRIN EC 81 MG EC TABLET    Take 81 mg by mouth daily.    ATORVASTATIN (LIPITOR) 80 MG TABLET    TAKE 1 TABLET BY MOUTH EVERY DAY    ESOMEPRAZOLE (NEXIUM) 20 MG CAPSULE    Take 20 mg by mouth every other day.       LISINOPRIL (PRINIVIL,ZESTRIL) 20 MG TABLET    TAKE 1 TABLET BY MOUTH DAILY    METOPROLOL TARTRATE (LOPRESSOR) 25 MG TABLET    TAKE 1 TABLET BY MOUTH TWICE A DAY  NUTRITIONAL SUPPLEMENTS (NITRO-PRO PO)    Take by mouth    OMEGA-3 FATTY ACIDS (OMEGA-3 FISH OIL) 500 MG CAP    Take by mouth 2 (two) times daily.    VITAMINS-LIPOTROPICS (VITAMIN B COMPLEX, LIPOTROPIC,) TABLET    Take 1 tablet by mouth daily      Allergies: He has No Known Allergies.            VISIT INFORMATION        Clinical Course in the ED:    9:52 AM  Epic does not appear to reveal the recent cardiac cath results or findings.  There is a note from Dr. Aneta Mins back to 2017.  Cardiac cath results from [facility] show 80% stenosis of the  left main 95% occlusion of the left circumflex 60 to 70% occlusion of the RCA.  Previous ejection fraction was noted to be normal.  Call to the [facility] cardiology team they will see him in consultation.  We will begin medical management ACS with anticipated admission        Medications Given in the ED:    .     ED Medication Orders (From admission, onward)      Start Ordered     Status Ordering Provider    11/23/21 1118 11/23/21 1118  nitroglycerin (NITROSTAT) SL tablet 0.4 mg  Every 5 min PRN        Route: Sublingual  Ordered Dose: 0.4 mg     Last MAR action: Given Deshone Lyssy D    11/23/21 1056 11/23/21 1055  ondansetron (ZOFRAN) injection 4 mg  Once        Route: Intravenous  Ordered Dose: 4 mg     Last MAR action: Given Hershy Flenner D    11/23/21 1029 11/23/21 1028  heparin (porcine) injection 4,000 Units  Once        Route: Intravenous  Ordered Dose: 4,000 Units     Acknowledged Letta Median D    11/23/21 1029 11/23/21 1028  heparin 25,000 units in dextrose 5% 500 mL infusion (premix)  Continuous        Route: Intravenous  Ordered Dose: 11.3 Units/kg/hr     Acknowledged Marcelene Weidemann D    11/23/21 1028 11/23/21 1028  heparin (porcine) injection 3,000 Units  As needed        Route: Intravenous  Ordered Dose: 3,000 Units     Acknowledged Alexei Ey D              Procedures:      Procedures      Interpretations:      EKG -          10:01 AM interpreted by me: normal sinus at 61 QRS axis -30 no appreciable ST segment elevation or depressions are noted.  There are no old EKGs currently available for comparison     EKG -          11:36 AM interpreted by me: normal sinus at 90.  QRS axis -60, no ST e/d    Monitor -         interpreted by me: normal sinus at 60s.             RESULTS        Lab Results:      Results       Procedure Component Value Units Date/Time    Anti-Xa, UFH [[id number]] Collected: 11/23/21 1111     Updated: 11/23/21 1157     Anti-Xa,  UFH 0.06 IU/mL     Narrative:      Obtain baseline aPTT prior to  heparin initiation if not drawn  previously, to evaluate for underlying coagulopathy or  recent non-heparin anticoagulant. exposure    APTT [[id number]] Collected: 11/23/21 1111     Updated: 11/23/21 1157     PTT 30 sec     Narrative:      Obtain baseline aPTT prior to heparin initiation if not drawn  previously, to evaluate for underlying coagulopathy or  recent non-heparin anticoagulant. exposure    Anti-Xa, UFH [[id number]] Collected: 11/23/21 1111     Updated: 11/23/21 1112    Narrative:      Every 8 hours after initiation and every rate change until  two subsequent values within goal range, then change  frequency to IHS daily at 0400 until heparin is discontinued.  Centrifuge and Freeze within 1 hour of collection.    CBC without differential [[id number]] Collected: 11/23/21 1111    Specimen: Blood Updated: 11/23/21 1112    Narrative:      Obtain baseline aPTT prior to heparin initiation if not drawn  previously, to evaluate for underlying coagulopathy or  recent non-heparin anticoagulant. exposure    Troponin I [[id number]] Collected: 11/23/21 1009    Specimen: Blood Updated: 11/23/21 1042     Troponin I <0.01 ng/mL     Comprehensive metabolic panel [[id number]]  (Abnormal) Collected: 11/23/21 1009    Specimen: Blood Updated: 11/23/21 1035     Glucose 116 mg/dL      BUN 25.3 mg/dL      Creatinine 1.0 mg/dL      Sodium 664 mEq/L      Potassium 4.1 mEq/L      Chloride 105 mEq/L      CO2 21 mEq/L      Calcium 8.9 mg/dL      Protein, Total 7.0 g/dL      Albumin 4.0 g/dL      AST (SGOT) 41 U/L      ALT 42 U/L      Alkaline Phosphatase 48 U/L      Bilirubin, Total 0.8 mg/dL      Globulin 3.0 g/dL      Albumin/Globulin Ratio 1.3     Anion Gap 9.0    GFR [[id number]] Collected: 11/23/21 1009     Updated: 11/23/21 1035     EGFR >60.0       CBC and differential [[id number]]  (Abnormal) Collected: 11/23/21 1009    Specimen: Blood Updated: 11/23/21 1021     WBC 8.62 x10 3/uL      Hgb 15.1 g/dL      Hematocrit 32.9 %       Platelets 201 x10 3/uL      RBC 4.89 x10 6/uL      MCV 86.7 fL      MCH 30.9 pg      MCHC 35.6 g/dL      RDW 12 %      MPV 10.2 fL      Neutrophils 84.4 %      Lymphocytes Automated 6.7 %      Monocytes 7.8 %      Eosinophils Automated 0.7 %      Basophils Automated 0.2 %      Immature Granulocytes 0.2 %      Nucleated RBC 0.0 /100 WBC      Neutrophils Absolute 7.27 x10 3/uL      Lymphocytes Absolute Automated 0.58 x10 3/uL  Monocytes Absolute Automated 0.67 x10 3/uL      Eosinophils Absolute Automated 0.06 x10 3/uL      Basophils Absolute Automated 0.02 x10 3/uL      Immature Granulocytes Absolute 0.02 x10 3/uL      Absolute NRBC 0.00 x10 3/uL                 Radiology Results:      XR Chest  AP Portable   Final Result      No acute thoracic abnormality.      Nonda Lou, MD    11/23/2021 10:47 AM                  Scribe Attestation:      I was acting as a Neurosurgeon for Ria Bush, MD on [place],Ryer ANDREW  Treatment Team: Scribe: Felipa Emory    I am the first provider for this patient and I personally performed the services documented. Treatment Team: Scribe: Felipa Emory is scribing for me on [employer],Kishawn ANDREW. This note accurately reflects work and decisions made by me.  Ria Bush, MD       *This note was generated by the Epic EMR system/ Dragon speech recognition and may contain inherent errors or omissions not intended by the user. Grammatical errors, random word insertions, deletions, pronoun errors and incomplete sentences are occasional consequences of this technology due to software limitations. Not all errors are caught or corrected. If there are questions or concerns about the content of this note or information contained within the body of this dictation they should be addressed directly with the author for clarification.       Ria Bush, MD  12/08/21 [phone]

## 2021-11-23 NOTE — H&P (Signed)
ADMISSION HISTORY AND PHYSICAL EXAM    Date Time: 11/23/21 1:02 PM  Patient Name: Raymond Cook  Attending Physician: Morton Peters, DO    Assessment:   [place]  Known CAD pending outpt cabg eval  Has stents on DAPT  HLD  HTN essential    Plan:   Admitting for unstable angina.  ED consulted [facility] cards; initial impression is may need to expedite cabg eval.  Heparin, asa statin bb etc.    Nitro / morphine.  f/u full cards recs.  Trend trops.  Full code  Anticipate >2 night stay; high risk of deterioration due to cad  35 mins spent with the pt.  Home medications reviewed.  Med rec complete; see navigator.    History of Present Illness:   34M c/o CP.  It has been happening on/off recently but overall progressing in frequency and intensity.  He has known severe CAD and is currently pending outpt CABG eval in dec.   CP This morning awakening him from sleep radiating to the neck associated with diaphoresis.  Was 10/10.  Improved with sublingual nitroglycerin, 1 out of 10 upon arrival in ED.  Also took aspirin as well as all his scheduled meds at home before presenting.  Some DOE.  No trauma.      Vitals stable.  Trop neg x1.  Basic labs benign  CXR neg  EKG no acute ischemic changes    Past Medical History:     Past Medical History:   Diagnosis Date    Coronary artery disease     Hyperlipidemia     NSTEMI (non-ST elevated myocardial infarction)     SOB (shortness of breath) on exertion     Stented coronary artery        Past Surgical History:     Past Surgical History:   Procedure Laterality Date    CORONARY ANGIOPLASTY         Family History:     Family History   Problem Relation Age of Onset    Breast cancer Mother     Diabetes Father         type I    Leukemia Father        Social History:     Social History     Socioeconomic History    Marital status: Married     Spouse name: Not on file    Number of children: Not on file    Years of education: Not on file    Highest education level: Not on file   Occupational  History    Not on file   Tobacco Use    Smoking status: Former     Packs/day: 1.00     Years: 21.00     Pack years: 21.00     Types: Cigarettes    Smokeless tobacco: Never    Tobacco comments:     quit 2008    Substance and Sexual Activity    Alcohol use: Yes     Alcohol/week: 2.0 standard drinks     Types: 1 Glasses of wine, 1 Cans of beer per week     Comment: 3-4 drinks 5 days/week    Drug use: No    Sexual activity: Not on file   Other Topics Concern    Not on file   Social History Narrative    Not on file     Social Determinants of Health     Financial Resource Strain: Not on file  Food Insecurity: Not on file   Transportation Needs: Not on file   Physical Activity: Not on file   Stress: Not on file   Social Connections: Not on file   Intimate Partner Violence: Not on file   Housing Stability: Not on file       Allergies:   No Known Allergies    Medications:     Home Medications       Med List Status: In Progress Set By: Desma Paganini, RN at 11/23/2021  9:54 AM              aspirin EC 81 MG EC tablet     Take 81 mg by mouth daily.     atorvastatin (LIPITOR) 80 MG tablet     TAKE 1 TABLET BY MOUTH EVERY DAY     esomeprazole (NEXIUM) 20 MG capsule     Take 20 mg by mouth every other day.        lisinopril (PRINIVIL,ZESTRIL) 20 MG tablet     TAKE 1 TABLET BY MOUTH DAILY     metoprolol tartrate (LOPRESSOR) 25 MG tablet     TAKE 1 TABLET BY MOUTH TWICE A DAY     Nutritional Supplements (NITRO-PRO PO)     Take by mouth     Omega-3 Fatty Acids (OMEGA-3 FISH OIL) 500 MG Cap     Take by mouth 2 (two) times daily.     Vitamins-Lipotropics (vitamin B complex, lipotropic,) tablet     Take 1 tablet by mouth daily          Review of Systems:   A comprehensive review of systems was:   ROS:  CONSTITUTIONAL: Denies weight loss, fever and chills.  HEENT: Denies changes in vision and hearing.  RESPIRATORY: see HPI  CV: see HPI  GI: Denies abdominal pain, nausea, vomiting and diarrhea, or hematochezia.  GU: Denies dysuria and  urinary frequency, or hematuria.  MSK: Denies myalgia and joint pain.  Denies rash and pruritus, or skin breaks.  NEUROLOGICAL: Denies headache and syncope.  PSYCHIATRIC: Denies recent changes in mood. Denies anxiety and depression.    Physical Exam:     Vitals:    11/23/21 1125   BP: 141/80   Pulse: 64   Resp: 18   Temp:    SpO2: 95%       Intake and Output Summary (Last 24 hours) at Date Time  No intake or output data in the 24 hours ending 11/23/21 1302  Exam:  GENERAL: AAOx 3. No acute distress. Well-nourished.  EYES: EOMI. Anicteric.  HENT: Moist mucous membranes. No scleral icterus. No lymphadenopathy.  LUNGS: CTA B/L. No accessory muscle use.  CARDIOVASCULAR: Regular rate and rhythm. No murmur. No JVD.  ABDOMEN: Soft, non-tender and non-distended. No palpable masses.  EXTREMITIES: No edema. Non-tender.  SKIN: No rashes or lesions. Warm.  NEUROLOGIC: No focal neurological deficits.    PSYCHIATRIC: Cooperative. Appropriate mood and affect.     Labs:     Results       Procedure Component Value Units Date/Time    Anti-Xa, UFH [[id number]] Collected: 11/23/21 1111     Updated: 11/23/21 1157     Anti-Xa, UFH 0.06 IU/mL     Narrative:      Obtain baseline aPTT prior to heparin initiation if not drawn  previously, to evaluate for underlying coagulopathy or  recent non-heparin anticoagulant. exposure    APTT [[id number]] Collected: 11/23/21 1111     Updated: 11/23/21 1157  PTT 30 sec     Narrative:      Obtain baseline aPTT prior to heparin initiation if not drawn  previously, to evaluate for underlying coagulopathy or  recent non-heparin anticoagulant. exposure    Anti-Xa, UFH [[id number]] Collected: 11/23/21 1111     Updated: 11/23/21 1112    Narrative:      Every 8 hours after initiation and every rate change until  two subsequent values within goal range, then change  frequency to IHS daily at 0400 until heparin is discontinued.  Centrifuge and Freeze within 1 hour of collection.    CBC without differential  [[id number]] Collected: 11/23/21 1111    Specimen: Blood Updated: 11/23/21 1112    Narrative:      Obtain baseline aPTT prior to heparin initiation if not drawn  previously, to evaluate for underlying coagulopathy or  recent non-heparin anticoagulant. exposure    Troponin I [[id number]] Collected: 11/23/21 1009    Specimen: Blood Updated: 11/23/21 1042     Troponin I <0.01 ng/mL     Comprehensive metabolic panel [[id number]]  (Abnormal) Collected: 11/23/21 1009    Specimen: Blood Updated: 11/23/21 1035     Glucose 116 mg/dL      BUN 62.9 mg/dL      Creatinine 1.0 mg/dL      Sodium 528 mEq/L      Potassium 4.1 mEq/L      Chloride 105 mEq/L      CO2 21 mEq/L      Calcium 8.9 mg/dL      Protein, Total 7.0 g/dL      Albumin 4.0 g/dL      AST (SGOT) 41 U/L      ALT 42 U/L      Alkaline Phosphatase 48 U/L      Bilirubin, Total 0.8 mg/dL      Globulin 3.0 g/dL      Albumin/Globulin Ratio 1.3     Anion Gap 9.0    GFR [[id number]] Collected: 11/23/21 1009     Updated: 11/23/21 1035     EGFR >60.0       CBC and differential [[id number]]  (Abnormal) Collected: 11/23/21 1009    Specimen: Blood Updated: 11/23/21 1021     WBC 8.62 x10 3/uL      Hgb 15.1 g/dL      Hematocrit 03.4 %      Platelets 201 x10 3/uL      RBC 4.89 x10 6/uL      MCV 86.7 fL      MCH 30.9 pg      MCHC 35.6 g/dL      RDW 12 %      MPV 10.2 fL      Neutrophils 84.4 %      Lymphocytes Automated 6.7 %      Monocytes 7.8 %      Eosinophils Automated 0.7 %      Basophils Automated 0.2 %      Immature Granulocytes 0.2 %      Nucleated RBC 0.0 /100 WBC      Neutrophils Absolute 7.27 x10 3/uL      Lymphocytes Absolute Automated 0.58 x10 3/uL      Monocytes Absolute Automated 0.67 x10 3/uL      Eosinophils Absolute Automated 0.06 x10 3/uL      Basophils Absolute Automated 0.02 x10 3/uL      Immature Granulocytes Absolute 0.02 x10 3/uL      Absolute NRBC 0.00 x10 3/uL  Recent CBC   Recent Labs     11/23/21  1009   RBC 4.89   Hgb 15.1   Hematocrit 42.4   MCV 86.7    MCH 30.9   MCHC 35.6   RDW 12   MPV 10.2       Rads:   Radiological Procedure reviewed.    Signed by: Morton Peters, DO

## 2021-11-23 NOTE — Consults (Signed)
[facility] Consultation  NP/PA [facility] [phone] and [phone] I Answering service: [phone]  Patient of Dr. Aneta Mins       Date of Service: 11/23/2021  Patient: Raymond Cook   MRN: [id number]       Referring Physician:  Ria Bush, MD    Reason for Consultation:  chest pain    Assessment and Plan:     Chest Pain, Concerning for Unstable Angina  Patient with known MV CAD awaiting revascularization with CABG next month presents with chest pain. He was woken up at 4 AM with severe substernal angina, he took NTG and went back to sleep. He awoke again at 6 Am with another episode of substernal chest pain this time associated with n/v. He thus presented to ED.  Trop negative x 1. EKG with non specc ST changes. Echo 07/2021 EF 65-70%    - Of note he has chronic chest pain and is on ranexa but this was an escalation in symptoms than his baseline  - continue to trend trops  - SL NTG PRN chest pain, if becomes refractory to SL NTG and SBP allows recommend NTG gtt  - resume Ranexa  - although troponin is sp far negative and EKG no acute changes, his history is concerning for unstable angina and thus recommend hep gtt at this time will re-eval in AM   - repeat echo this admission, none done since prior to [facility] since 07/2021  - CVS consult in AM for discussion of inpatient vs OP CABG  - continue Asa, HI statin with lipitor 80, Lopressor 25 BID and lisinopril 20  - euvolemic on exam  - close tele monitoring    MV CAD, s/p MI 2015 with DES to LAD, LHC 11/13/21 with MV CAD  Patient with known CAD with DES to LAD post MI 2015, MV CAD via LHC cath at [facility] 11/13/2021 showing Multivessel CAD involving LAD, LCx, and RCA. LCx is likely culprit for  symptoms. LAD lesion is hemodynamically significant by iFR. Unable to pass  iFR catheter distal to prox RCA lesion. Awaiting CABG planned for next month but now presenting with worse chest pain    -CABG versus high-risk PCI recommended. Patient has  scheduling for upcoming evaluation with CV surgery in December  - CVS consult in AM for discussion of inpatient vs OP CABG    HLD  - statin    HTN  As above    Patient Active Problem List    Diagnosis Date Noted    SOB (shortness of breath) on exertion [R06.02]     Stented coronary artery [Z95.5]     NSTEMI (non-ST elevated myocardial infarction) [I21.4]     Coronary artery disease [I25.10]     Hyperlipidemia [E78.5]        Chief Complaint:     Chst pain    History of Present Illness:   Raymond Cook is a 55 y.o. male Patient with known MV CAD awaiting revascularization with CABG next month presents with chest pain. He was woken up at 4 AM with severe substernal angina, he took NTG and went back to sleep. He awoke again at 6 Am with another episode of substernal chest pain this time associated with n/v. He thus presented to ED.  Prior Cardiac History/Testing:     Cath 11/13/21  1.The left main coronary artery is a large vessel, which bifurcates into   the left anterior descending artery and left circumflex artery. There  are   no significant angiographic stenoses.   2. The left anterior descending artery is a large caliber vessel  that   gives off 1 small diagonal branch. There is an 80% stenosis involving the   proximal edge of prior long mid LAD stent. There are no other significant   areas of disease i nthe LAD   3. The left circumflex artery is a large caliber vessel, which gives off 2   major obtuse marginal artery(s). There is a 95% proximal LCx stenosis at   the bifurcation of hte OM1 and remaining LCx ([place] 1:1:1)   4. The right coronary artery is a  large dominant vessel. There is a   60-70% prox/mid lesion (heavily calcific) with a 60% lesion in the mid   RCA. There is a 60% ostial RPDA stenosis.     iFR   An IL3.5 guiding catheter was advanced to LM. A 0.014" Runthrough was   advanced to the distal LAD.  An Ascist Navus catheter was an advanced but   could not enter the previously placed stent.  Resting iFR = 0.88 which is   hemodynamically signfiicant.     We attempted iFR of the prox/mid RCA but again Navus iFR catheter could   not pass likely due to high-grade eccentric lesion.     CONCLUSION:   1. Multivessel CAD involving LAD, LCx, and RCA. LCx is likely culprit for   symptoms. LAD lesion is hemodynamically significant by iFR. Unable to pass   iFR catheter distal to prox RCA lesion         Echo 08/19/21  1. Normal left ventricular systolic function.  2. Estimated ejection fraction 65-70%.  3. Mild aortic root dilation 3.8 cm.  4. Trace to mild mitral regurgitation.  5. Trace to mild tricuspid regurgitation.  Past History:     Past Medical History:   Diagnosis Date    Coronary artery disease     Hyperlipidemia     NSTEMI (non-ST elevated myocardial infarction)     SOB (shortness of breath) on exertion     Stented coronary artery       Past Surgical History:   Procedure Laterality Date    CORONARY ANGIOPLASTY        (Not in a hospital admission)    No Known Allergies   Current Facility-Administered Medications   Medication Dose Route Frequency Provider Last Rate Last Admin    heparin (porcine) injection 3,000 Units  3,000 Units Intravenous PRN Letta Median D, MD        heparin (porcine) injection 4,000 Units  4,000 Units Intravenous Once Letta Median D, MD        heparin 25,000 units in dextrose 5% 500 mL infusion (premix)  11.3 Units/kg/hr Intravenous Continuous Letta Median D, MD        nitroglycerin (NITROSTAT) SL tablet 0.4 mg  0.4 mg Sublingual Q5 Min PRN Letta Median D, MD   0.4 mg at 11/23/21 1129     Current Outpatient Medications   Medication Sig Dispense Refill    aspirin EC 81 MG EC tablet Take 81 mg by mouth daily.      atorvastatin (LIPITOR) 80 MG tablet TAKE 1 TABLET BY MOUTH EVERY DAY 90 tablet 3    esomeprazole (NEXIUM) 20 MG capsule Take 20 mg by mouth every other day.         lisinopril (PRINIVIL,ZESTRIL) 20 MG tablet TAKE 1 TABLET BY MOUTH DAILY 90 tablet 3    metoprolol  tartrate  (LOPRESSOR) 25 MG tablet TAKE 1 TABLET BY MOUTH TWICE A DAY 180 tablet 3    Nutritional Supplements (NITRO-PRO PO) Take by mouth      Omega-3 Fatty Acids (OMEGA-3 FISH OIL) 500 MG Cap Take by mouth 2 (two) times daily.      Vitamins-Lipotropics (vitamin B complex, lipotropic,) tablet Take 1 tablet by mouth daily       Social History     Tobacco Use    Smoking status: Former     Packs/day: 1.00     Years: 21.00     Pack years: 21.00     Types: Cigarettes    Smokeless tobacco: Never    Tobacco comments:     quit 2008    Substance Use Topics    Alcohol use: Yes     Alcohol/week: 2.0 standard drinks     Types: 1 Glasses of wine, 1 Cans of beer per week     Comment: 3-4 drinks 5 days/week        Family History:   Family History   Problem Relation Age of Onset    Breast cancer Mother     Diabetes Father         type I    Leukemia Father           Review of Systems:     General: Denies fevers, chills, or sweats.  Denies fatigue, weight gain, or weight loss.    Head:  Denies headaches, vision loss, eye pain.  ENT:  Denies nasal congestion, sinus pain, sore throat, oral bleeding  Respiratory:  Denies cough, wheezing, or sputum production.    Cardiovascular:  See HPI.  Denies calf, thigh, or buttock claudication.    Gastrointestinal:  +n/v  Neurological:  Denies lightheadedness, syncope, or focal weakness.    Musculoskeletal:  Denies arthritis or myalgias.   Genitourinary:  Denies dysuria or bladder problems.  Skin:  Denies rash, itching, or new lesions.    Hematologic:  Denies easy bruising or bleeding.    Endocrine:  Denies heat/cold intolerance or increased thirst.  Psychiatric:  Denies depression or anxiety.    Complete ten point review of systems was performed.  Except as noted above and in the HPI, no other pertinent positives or negatives were identified.      Physical Exam:     BP 141/80   Pulse 64   Temp 97.5 F (36.4 C) (Temporal)   Resp 18   Wt 88.5 kg (195 lb)   SpO2 95%   BMI 27.98 kg/m     Intake and  Output Summary (Last 24 hours) at Date Time  No intake or output data in the 24 hours ending 11/23/21 1215    General:  Well-nourished, well-developed.  Alert and in no apparent distress.  Eyes:  Anicteric sclera.  Pupils equal and round.    ENT:  Hearing grossly intact. Lips moist, color appropriate for race.  Neck:  2+ carotids bilaterally with normal upstroke; no carotid bruits.  No JVD.  No LAD.  Lungs:  Clear to auscultation bilaterally. No wheezes or crackles.  Respiratory effort unlabored, chest expansion symmetric.  Cardiovascular:  PMI non-displaced.  No tenderness to palpation.  Regular rate and rhythm. Normal S1 and S2.  No S3 or S4.  No murmurs or rubs.  Abdomen:  Soft, non-tender, non-distended.  No organomegaly.  No pulsatile masses.  Extremities:  2+ bilateral radial and pedal pulses.  no edema.  Skin:  Warm, dry, and  intact. No rashes or lesions on exposed portions of skin.  Neurologic:  Moving all four extremities; exam grossly non-focal      Ancillary Data:     Cardiac Data Review  Echo Results       None          EKG Results       Procedure Component Value Units Date/Time    ECG 12 Lead [[id number]] Collected: 11/23/21 1001     Updated: 11/23/21 1001     Ventricular Rate 61 BPM      Atrial Rate 61 BPM      P-R Interval 132 ms      QRS Duration 86 ms      Q-T Interval 384 ms      QTC Calculation (Bezet) 386 ms      P Axis 18 degrees      R Axis -39 degrees      T Axis 1 degrees      IHS MUSE NARRATIVE AND IMPRESSION --     NORMAL SINUS RHYTHM  LEFT AXIS DEVIATION  ABNORMAL ECG  NO PREVIOUS ECGS AVAILABLE      Narrative:      NORMAL SINUS RHYTHM  LEFT AXIS DEVIATION  ABNORMAL ECG  NO PREVIOUS ECGS AVAILABLE            Labs:   Lab Results   Component Value Date    WBC 8.62 11/23/2021    HGB 15.1 11/23/2021    HCT 42.4 11/23/2021    MCV 86.7 11/23/2021    PLT 201 11/23/2021     No results found for: CKTOTAL, CKMB, CKMBINDEX  No results found for: CKTOTAL  Lab Results   Component Value Date    BUN 17.0  11/23/2021    NA 135 11/23/2021    K 4.1 11/23/2021    CL 105 11/23/2021    CO2 21 11/23/2021       Chest X-Ray:  Radiology Results (24 Hour)       Procedure Component Value Units Date/Time    XR Chest  AP Portable [[id number]] Collected: 11/23/21 1046    Order Status: Completed Updated: 11/23/21 1049    Narrative:      HISTORY: Chest pain.     COMPARISON: None    FINDINGS:   There is no focal consolidation, pneumothorax or pleural effusion. The  cardiomediastinal silhouette is within normal limits. No acute osseous  abnormality is seen. Coronary artery stent is seen.      Impression:        No acute thoracic abnormality.    Nonda Lou, MD   11/23/2021 10:47 AM          The patient was seen and examined, chart reviewed.  I reviewed the note by the PA/NP, discussed the case and agree with the findings and plan as outlined.      Theone Stanley MD, [facility], RPVI  Interventional Cardiology  [facility]

## 2021-11-24 ENCOUNTER — Inpatient Hospital Stay: Payer: No Typology Code available for payment source

## 2021-11-24 ENCOUNTER — Inpatient Hospital Stay (HOSPITAL_COMMUNITY): Payer: No Typology Code available for payment source

## 2021-11-24 DIAGNOSIS — Z955 Presence of coronary angioplasty implant and graft: Secondary | ICD-10-CM

## 2021-11-24 DIAGNOSIS — E782 Mixed hyperlipidemia: Secondary | ICD-10-CM

## 2021-11-24 DIAGNOSIS — I200 Unstable angina: Secondary | ICD-10-CM

## 2021-11-24 DIAGNOSIS — Z6828 Body mass index (BMI) 28.0-28.9, adult: Secondary | ICD-10-CM

## 2021-11-24 DIAGNOSIS — Z01818 Encounter for other preprocedural examination: Secondary | ICD-10-CM

## 2021-11-24 LAB — COMPREHENSIVE METABOLIC PANEL
ALT: 69 U/L — ABNORMAL HIGH (ref 0–55)
AST (SGOT): 74 U/L — ABNORMAL HIGH (ref 5–41)
Albumin/Globulin Ratio: 1.2 (ref 0.9–2.2)
Albumin: 3.7 g/dL (ref 3.5–5.0)
Alkaline Phosphatase: 52 U/L (ref 37–117)
Anion Gap: 10 (ref 5.0–15.0)
BUN: 14 mg/dL (ref 9.0–28.0)
Bilirubin, Total: 0.5 mg/dL (ref 0.2–1.2)
CO2: 21 mEq/L (ref 17–29)
Calcium: 8.3 mg/dL — ABNORMAL LOW (ref 8.5–10.5)
Chloride: 104 mEq/L (ref 99–111)
Creatinine: 1.1 mg/dL (ref 0.5–1.5)
Globulin: 3.1 g/dL (ref 2.0–3.6)
Glucose: 99 mg/dL (ref 70–100)
Potassium: 4 mEq/L (ref 3.5–5.3)
Protein, Total: 6.8 g/dL (ref 6.0–8.3)
Sodium: 135 mEq/L (ref 135–145)

## 2021-11-24 LAB — URINALYSIS WITH MICROSCOPIC
Bilirubin, UA: NEGATIVE
Blood, UA: NEGATIVE
Glucose, UA: NEGATIVE
Ketones UA: NEGATIVE
Leukocyte Esterase, UA: NEGATIVE
Nitrite, UA: NEGATIVE
Protein, UR: NEGATIVE
Specific Gravity UA: 1.01 (ref 1.001–1.035)
Urine pH: 6.5 (ref 5.0–8.0)
Urobilinogen, UA: NORMAL mg/dL (ref 0.2–2.0)

## 2021-11-24 LAB — ECHOCARDIOGRAM ADULT COMPLETE W CLR/ DOPP WAVEFORM
AV Area (Cont Eq VTI): 2.87
AV Area (Cont Eq VTI): 2.872
AV Mean Gradient: 3
AV Peak Velocity: 116
Ao Root Diameter (2D): 3.3
BP Mod LV Ejection Fraction: 59.742
IVS Diastolic Thickness (2D): 1
LA Dimension (2D): 3.4
LA Volume Index (BP A-L): 0.02
LVID diastole (2D): 4.9
LVID systole (2D): 3
MV Area (PHT): 4.598
MV E/A: 1.362
MV E/A: 1.4
MV E/e' (Average): 6.671
Mitral Valve Findings: NORMAL
Prox Ascending Aorta Diameter: 3.3
Pulmonary Valve Findings: NORMAL
RV Basal Diastolic Dimension: 3.6
RV Function: NORMAL
Site RA Size (AS): NORMAL
Site RV Size (AS): NORMAL
TAPSE: 2.36
Tricuspid Valve Findings: NORMAL

## 2021-11-24 LAB — MAGNESIUM: Magnesium: 1.9 mg/dL (ref 1.6–2.6)

## 2021-11-24 LAB — CBC
Absolute NRBC: 0 10*3/uL (ref 0.00–0.00)
Hematocrit: 44.5 % (ref 37.6–49.6)
Hgb: 15.5 g/dL (ref 12.5–17.1)
MCH: 31.4 pg (ref 25.1–33.5)
MCHC: 34.8 g/dL (ref 31.5–35.8)
MCV: 90.1 fL (ref 78.0–96.0)
MPV: 10.5 fL (ref 8.9–12.5)
Nucleated RBC: 0 /100 WBC (ref 0.0–0.0)
Platelets: 197 10*3/uL (ref 142–346)
RBC: 4.94 10*6/uL (ref 4.20–5.90)
RDW: 12 % (ref 11–15)
WBC: 6.88 10*3/uL (ref 3.10–9.50)

## 2021-11-24 LAB — TYPE AND SCREEN
AB Screen Gel: NEGATIVE
ABO Rh: O NEG

## 2021-11-24 LAB — ANTI-XA,UFH
Anti-Xa, UFH: 0.44 IU/mL
Anti-Xa, UFH: 0.41 IU/mL
Anti-Xa, UFH: 0.34 IU/mL

## 2021-11-24 LAB — HEMOGLOBIN A1C
Average Estimated Glucose: 114 mg/dL
Hemoglobin A1C: 5.6 % (ref 4.6–5.9)

## 2021-11-24 LAB — APTT: PTT: 47 s — ABNORMAL HIGH (ref 27–39)

## 2021-11-24 LAB — PT/INR
PT INR: 1.2 — ABNORMAL HIGH (ref 0.9–1.1)
PT: 13.7 s — ABNORMAL HIGH (ref 10.1–12.9)

## 2021-11-24 LAB — BLOOD TYPE CONFIRMATION: Blood Type Confirmation: O NEG

## 2021-11-24 LAB — COVID-19 (SARS-COV-2): SARS CoV-2: NEGATIVE

## 2021-11-24 LAB — RED BLOOD CELLS OR HOLD: Expiration Date: 202212282359

## 2021-11-24 LAB — COLD AGGLUTININ SCREEN: Cold Screen: NEGATIVE

## 2021-11-24 LAB — GFR: EGFR: >60

## 2021-11-24 LAB — PRU: PRU: 207 (ref 194–418)

## 2021-11-24 MED ORDER — EZETIMIBE 10 MG PO TABS
10.0000 mg | ORAL_TABLET | Freq: Every evening | ORAL | Status: DC
Start: 2021-11-24 — End: 2021-11-25
  Administered 2021-11-24: 10 mg via ORAL
  Filled 2021-11-24: qty 1

## 2021-11-24 MED ORDER — MUPIROCIN CALCIUM 2 % NA OINT
TOPICAL_OINTMENT | Freq: Two times a day (BID) | NASAL | Status: DC
Start: 2021-11-24 — End: 2021-11-25
  Administered 2021-11-24 – 2021-11-25 (×2): 0.9 via NASAL
  Filled 2021-11-24 (×2): qty 0.9

## 2021-11-24 MED ORDER — AMIODARONE HCL 200 MG PO TABS
400.0000 mg | ORAL_TABLET | Freq: Three times a day (TID) | ORAL | Status: DC
Start: 2021-11-24 — End: 2021-11-25
  Administered 2021-11-24 – 2021-11-25 (×3): 400 mg via ORAL
  Filled 2021-11-24: qty 2
  Filled 2021-11-24: qty 1
  Filled 2021-11-24 (×2): qty 2

## 2021-11-24 MED ORDER — ATORVASTATIN CALCIUM 80 MG PO TABS
80.0000 mg | ORAL_TABLET | Freq: Every evening | ORAL | Status: DC
Start: 2021-11-24 — End: 2021-11-25
  Administered 2021-11-24: 80 mg via ORAL
  Filled 2021-11-24: qty 1

## 2021-11-24 NOTE — Progress Notes (Addendum)
MEDICINE PROGRESS NOTE    Date Time: 11/24/21 1:37 PM  Patient Name: Raymond Cook  Attending Physician: Alan Mulder, MD    Assessment:   62M with PMH of known severe CAD s/p stents on DAPT pending outpt CABG eval in Dec, HLD, BMI 28, presenting with unstable angina.     Plan:   [facility] following   CV surgery evaluated plan for CABG tomorrow 11/29  Continue heparin drip   Continue asa, statin, BB, ranexa   Echo from 11/24/2021 with preserved EF  Hold ACE/ARB and Plavix given surgical work up  If he has more chest pain can try nitropaste     Lines:     Patient Lines/Drains/Airways Status       Active PICC Line / CVC Line / PIV Line / Drain / Airway / Intraosseous Line / Epidural Line / ART Line / Line / Wound / Pressure Ulcer / NG/OG Tube       Name Placement date Placement time Site Days    Peripheral IV 11/23/21 18 G Standard Hand 11/23/21  0937  Hand  1    Peripheral IV 11/23/21 18 G Standard Left Antecubital 11/23/21  1316  Antecubital  1                         Subjective     CC: Unstable angina        HPI/Subjective: feels ok, denies any chest pain, dyspnea      Review of Systems:     As per HPI    Physical Exam:     VITAL SIGNS PHYSICAL EXAM   Temp:  [98.1 F (36.7 C)-98.4 F (36.9 C)] 98.1 F (36.7 C)  Heart Rate:  [51-75] 51  Resp Rate:  [16-26] 19  BP: (113-151)/(62-90) 126/72  Blood Glucose:    Telemetry:       Intake/Output Summary (Last 24 hours) at 11/24/2021 1337  Last data filed at 11/23/2021 2152  Gross per 24 hour   Intake 741.38 ml   Output --   Net 741.38 ml    Physical Exam  General: awake, alert X 3  Cardiovascular: regular rate and rhythm, no murmurs, rubs or gallops  Lungs: clear to auscultation bilaterally, without wheezing, rhonchi, or rales  Abdomen: soft, non-tender, non-distended; no palpable masses,  normoactive bowel sounds  Extremities: no edema  Other:         Meds:     Medications were reviewed:  Current Facility-Administered Medications   Medication Dose Route Frequency     aspirin EC  81 mg Oral Daily    atorvastatin  80 mg Oral QHS    ezetimibe  10 mg Oral QHS    famotidine  20 mg Oral Q12H SCH    Or    famotidine  20 mg Intravenous Q12H SCH    metoprolol tartrate  25 mg Oral Q12H SCH    ranolazine  1,000 mg Oral Q12H SCH     Current Facility-Administered Medications   Medication Dose Route Frequency Last Rate    heparin infusion 25,000 units/500 mL (Cardiac/Low Intensity)  11.3 Units/kg/hr Intravenous Continuous 10.3 Units/kg/hr (11/23/21 2058)     Current Facility-Administered Medications   Medication Dose Route    acetaminophen  650 mg Oral    Or    acetaminophen  650 mg Rectal    glucagon (rDNA)  1 mg Intramuscular    And    dextrose  250 mL Intravenous  And    dextrose  25 g Intravenous    And    dextrose  25 g Intravenous    heparin (porcine)  3,000 Units Intravenous    LORazepam  0.25 mg Oral    magnesium sulfate  1 g Intravenous    melatonin  3 mg Oral    morphine  1 mg Intravenous    naloxone  0.2 mg Intravenous    nitroglycerin  0.4 mg Sublingual    ondansetron  4 mg Oral    Or    ondansetron  4 mg Intravenous    potassium & sodium phosphates  2 packet Oral    potassium chloride  0-40 mEq Oral    And    potassium chloride  10 mEq Intravenous         Labs:     Labs (last 72 hours):    Recent Labs   Lab 11/24/21  0430 11/23/21  1009   WBC 6.88 8.62   Hgb 15.5 15.1   Hematocrit 44.5 42.4   Platelets 197 201       Recent Labs   Lab 11/23/21  1111   PTT 30    Recent Labs   Lab 11/24/21  0430 11/23/21  1009   Sodium 135 135   Potassium 4.0 4.1   Chloride 104 105   CO2 21 21   BUN 14.0 17.0   Creatinine 1.1 1.0   Calcium 8.3* 8.9   Albumin 3.7 4.0   Protein, Total 6.8 7.0   Bilirubin, Total 0.5 0.8   Alkaline Phosphatase 52 48   ALT 69* 42   AST (SGOT) 74* 41   Glucose 99 116*                   Microbiology, reviewed and are significant for:  Microbiology Results (last 15 days)       ** No results found for the last 360 hours. **            Imaging, reviewed and are significant  for:  XR Chest  AP Portable    Result Date: 11/23/2021  No acute thoracic abnormality. Nonda Lou, MD  11/23/2021 10:47 AM         Signed by: Alan Mulder, MD

## 2021-11-24 NOTE — Anesthesia Preprocedure Evaluation (Signed)
Anesthesia Evaluation    AIRWAY    Mallampati: III    TM distance: >3 FB  Neck ROM: full  Mouth Opening:full  Planned to use difficult airway equipment: No CARDIOVASCULAR    regular       DENTAL    no notable dental hx     PULMONARY    clear to auscultation     OTHER FINDINGS                ===============================================================  Inpatient Anesthesia Evaluation    Patient Name: Raymond Cook,Raymond Cook  Surgeon: Austin Miles, MD  Patient Age / Sex: 55 y.o. / male    55 y/o male with history of CAD with previous PCI (DES to LAD 2015), HTN, ETOH abuse, remote tobacco abuse, presenting for CABG.     TTE 11/24/2021:  Summary    * The left ventricle is normal in size (LVEDD: 4.9 cm, LVESD: 3.0 cm).    * Left ventricular systolic function is normal with an ejection fraction by  Biplane Method of Discs of  60 %.    * Normal right ventricular systolic function.    * Atria are normal in size.    * No significant valvular abnormalities.    * Compared to the prior study on 01/19/2017, no major changes are noted.    LHC 11/13/2021 [facility]:  1.The left main coronary artery is a large vessel, which bifurcates into   the left anterior descending artery and left circumflexartery. There are   no significant angiographic stenoses.     2. The left anterior descending artery is a large caliber vessel that   gives off 1 small diagonal branch. There is an 80% stenosis involving the   proximal edge of prior long mid LAD stent. There are no other significant   areas of disease i nthe LAD     3. The left circumflex artery is a large caliber vessel, which gives off 2   major obtuse marginal artery(s). There is a 95% proximal LCx stenosis at   the bifurcation of hte OM1 and remaining LCx ([place] 1:1:1)   4. The right coronary artery is a large dominant vessel. There is a   60-70% prox/mid lesion (heavily calcific) with a 60% lesion in the mid   RCA. There is a 60% ostial RPDA stenosis.     CONCLUSION:    1. Multivessel CAD involving LAD, LCx, and RCA. LCx is likely culprit for   symptoms. LAD lesion is hemodynamically significant by iFR. Unable to pass   iFR catheter distal to prox RCA lesion     Carotid Duplex 11/24/2021:  IMPRESSION:   1. Mild irregular heterogeneous plaque in the right carotid  bulb/proximal ICA, resulting in a less than 50% stenosis.  2. Moderate irregular calcified plaque in the left carotid bulb/proximal  ICA, resulting in a less than 50% stenosis.    CXR 11/23/2021:  IMPRESSION:   No acute thoracic abnormality.    EKG 11/23/2021:  NORMAL SINUS RHYTHM  LEFT AXIS DEVIATION  ABNORMAL ECG  WHEN COMPARED WITH ECG OF 23-Nov-2021 10:01, (UNCONFIRMED)  NO SIGNIFICANT CHANGE WAS FOUND  Rate 89 bpm, QTc 428 ms    COVID negative     Anesthesia history:  None in system    Medical History:     Past Medical History:   Diagnosis Date   . Coronary artery disease    . Hyperlipidemia    . NSTEMI (non-ST elevated myocardial infarction)    .  SOB (shortness of breath) on exertion    . Stented coronary artery        Past Surgical History:   Procedure Laterality Date   . CORONARY ANGIOPLASTY           Allergies:   No Known Allergies      Medications:     Current Facility-Administered Medications   Medication Dose Route Frequency Last Rate Last Admin   . acetaminophen (TYLENOL) tablet 650 mg  650 mg Oral Q6H PRN        Or   . acetaminophen (TYLENOL) suppository 650 mg  650 mg Rectal Q6H PRN       . amiodarone (PACERONE) tablet 400 mg  400 mg Oral Q8H SCH       . aspirin EC tablet 81 mg  81 mg Oral Daily   81 mg at 11/24/21 1010   . atorvastatin (LIPITOR) tablet 80 mg  80 mg Oral QHS       . glucagon (rDNA) (GLUCAGEN) injection 1 mg  1 mg Intramuscular PRN        And   . dextrose 5 % bolus 250 mL  250 mL Intravenous PRN        And   . dextrose 50 % bolus 25 g  25 g Intravenous PRN        And   . dextrose (D10W) 10% bolus 250 mL  25 g Intravenous PRN       . ezetimibe (ZETIA) tablet 10 mg  10 mg Oral QHS       .  famotidine (PEPCID) tablet 20 mg  20 mg Oral Q12H SCH   20 mg at 11/24/21 1010    Or   . famotidine (PEPCID) injection 20 mg  20 mg Intravenous Q12H SCH       . heparin (porcine) injection 3,000 Units  3,000 Units Intravenous PRN       . heparin 25,000 units in dextrose 5% 500 mL infusion (premix)  11.3 Units/kg/hr Intravenous Continuous 18.2 mL/hr at 11/24/21 1556 10.3 Units/kg/hr at 11/24/21 1556   . LORazepam (ATIVAN) tablet 0.25 mg  0.25 mg Oral Q8H PRN       . magnesium sulfate 1g in dextrose 5% IVPB (premix)  1 g Intravenous PRN   Stopped at 11/24/21 1013   . melatonin tablet 3 mg  3 mg Oral QHS PRN       . metoprolol tartrate (LOPRESSOR) tablet 25 mg  25 mg Oral Q12H SCH   25 mg at 11/24/21 1010   . morphine injection 1 mg  1 mg Intravenous Q4H PRN       . mupirocin (BACTROBAN NASAL) 2 % ointment   Nasal BID       . naloxone (NARCAN) injection 0.2 mg  0.2 mg Intravenous PRN       . nitroglycerin (NITROSTAT) SL tablet 0.4 mg  0.4 mg Sublingual Q5 Min PRN   0.4 mg at 11/23/21 1129   . ondansetron (ZOFRAN-ODT) disintegrating tablet 4 mg  4 mg Oral Q6H PRN        Or   . ondansetron (ZOFRAN) injection 4 mg  4 mg Intravenous Q6H PRN       . potassium & sodium phosphates (PHOS-NAK) 280-160-250 MG packet 2 packet  2 packet Oral PRN       . potassium chloride (KLOR-CON) CR tablet 0-40 mEq  0-40 mEq Oral PRN        And   .  potassium chloride 10 mEq in 100 mL IVPB (premix)  10 mEq Intravenous PRN       . ranolazine (RANEXA) 12 hr tablet 1,000 mg  1,000 mg Oral Q12H SCH   1,000 mg at 11/24/21 1010              Prior to Admission medications    Medication Sig Start Date End Date Taking? Authorizing Provider   aspirin EC 81 MG EC tablet Take 81 mg by mouth daily.   Yes [provider]   atorvastatin (LIPITOR) 80 MG tablet TAKE 1 TABLET BY MOUTH EVERY DAY 01/13/17  Yes Mafi, Shahryar, MD   esomeprazole (NEXIUM) 20 MG capsule Take 20 mg by mouth every other day.      Yes [provider]   lisinopril  (PRINIVIL,ZESTRIL) 20 MG tablet TAKE 1 TABLET BY MOUTH DAILY 11/30/16  Yes Mafi, Shahryar, MD   metoprolol tartrate (LOPRESSOR) 25 MG tablet TAKE 1 TABLET BY MOUTH TWICE A DAY 01/13/17  Yes Mafi, Shahryar, MD   Nutritional Supplements (NITRO-PRO PO) Take by mouth   Yes [provider]   Omega-3 Fatty Acids (OMEGA-3 FISH OIL) 500 MG Cap Take by mouth 2 (two) times daily.   Yes [provider]   ranolazine (RANEXA) 500 MG 12 hr tablet Take 1,000 mg by mouth 2 (two) times daily   Yes [provider]   Vitamins-Lipotropics (vitamin B complex, lipotropic,) tablet Take 1 tablet by mouth daily   Yes [provider]     Vitals   Temp:  [36.7 C (98.1 F)-36.9 C (98.4 F)] 36.9 C (98.4 F)  Heart Rate:  [51-75] 57  Resp Rate:  [16-19] 18  BP: (113-141)/(62-81) 129/66    Wt Readings from Last 3 Encounters:   11/24/21 88.9 kg (195 lb 14.4 oz)   11/30/16 88.8 kg (195 lb 12.8 oz)   02/18/16 80.7 kg (178 lb)     BMI (Estimated body mass index is 28.11 kg/m as calculated from the following:    Height as of this encounter: 1.778 m (5\' 10" ).    Weight as of this encounter: 88.9 kg (195 lb 14.4 oz).)  Temp Readings from Last 3 Encounters:   11/24/21 36.9 C (98.4 F) (Oral)     BP Readings from Last 3 Encounters:   11/24/21 129/66   11/30/16 135/84   02/18/16 132/81     Pulse Readings from Last 3 Encounters:   11/24/21 (!) 57   11/30/16 60   02/18/16 (!) 55           Labs:   CBC:  Lab Results   Component Value Date    WBC 6.88 11/24/2021    HGB 15.5 11/24/2021    HCT 44.5 11/24/2021    PLT 197 11/24/2021       Chemistries:  Lab Results   Component Value Date    NA 135 11/24/2021    K 4.0 11/24/2021    CL 104 11/24/2021    CO2 21 11/24/2021    BUN 14.0 11/24/2021    CREAT 1.1 11/24/2021    GLU 99 11/24/2021    CA 8.3 (L) 11/24/2021    AST 74 (H) 11/24/2021       Coags:  Lab Results   Component Value Date    PTT 30 11/23/2021     _____________________      Signed by: Wayland Salinas, MD  11/24/21    5:56 PM    =============================================================  Relevant Problems   PULMONARY   (+) SOB (shortness of breath) on exertion      CARDIO   (+) Coronary artery disease   (+) NSTEMI (non-ST elevated myocardial infarction)   (+) SOB (shortness of breath) on exertion   (+) Stented coronary artery   (+) Unstable angina               Anesthesia Plan    ASA 4     general                     intravenous induction   Detailed anesthesia plan: general endotracheal  Monitors/Adjuncts: arterial line, CVP, BIS, ANH and TEE          informed consent obtained      pertinent labs reviewed             Signed by: Wayland Salinas, MD 11/24/21 5:56 PM

## 2021-11-24 NOTE — Plan of Care (Signed)
Pt education reviewed using the CABG booklet, CV surgery pathways and frequently used medication sheet. Pt spouse present at bedside and included in pt education.    Use of IS reviewed: yes  Pt return demo of IS use: yes  Daily weights: reviewed  Daily temp monitoring: reviewed  BP and HR monitoring at home: reviewed  Sternal precautions post CABG: reviewed  Washing/bathing instructions post surgery: reviewed  Signs and symptoms of infection: reviewed  Cardiac rehab: reviewed and info provided  Follow up post surgery: will be facilitated closer to d/c  Diet handout: reviewed     Jorene Guest, RN, MSN, PCCN-K  Patient Care Navigator, [place]  [phone]

## 2021-11-24 NOTE — Progress Notes (Signed)
[facility] Progress Note  NP/PA Spectra [phone] and [phone]  Patient of Dr.      Patient: Raymond Cook   MRN: [id number]   Date of Service: 11/24/2021    Assessment and Plan:   Unstable angina, known multi vessel CAD  Patient with known MV CAD awaiting revascularization with CABG next month presents with chest pain. Patient with known CAD with DES to LAD post MI 2015, MV CAD via LHC cath at [facility] 11/13/2021 showing Multivessel CAD involving LAD, LCx, and RCA. LCx is likely culprit for  symptoms. LAD lesion is hemodynamically significant by iFR. Unable to pass  iFR catheter distal to prox RCA lesion. He was woken up at 4-AM with severe substernal angina, he took NTG and went back to sleep. He awoke again at 6 Am with another episode of substernal chest pain this time associated with n/v. He thus presented to ED.  Trop negative x1. EKG with non specc ST changes. Echo 07/2021 EF 65-70%    - Cardiac surgery consulted and aware  - No current CP  - If has additional episodes of CP will start nitro paste  - Continue with ASA, statin and BB, Ranexa 1000 mg BID  - Echo from 07/2021 with preserved EF  - Hold ACE/ARB and Plavix given surgical work up    HLD  01/24/21 Lipid Panel: TC 104, TG 82, HDL 37, LDL 50    - Continue high intensity statin    HTN  As above    Will follow.  Patient Active Problem List    Diagnosis Date Noted    *HUnstable angina [I20.0] 11/23/2021     Priority: Low    Hyperlipidemia [E78.5]      Priority: High    SOB (shortness of breath) on exertion [R06.02]      Priority: Low    Stented coronary artery [Z95.5]      Priority: Low    NSTEMI (non-ST elevated myocardial infarction) [I21.4]      Priority: Low    Coronary artery disease [I25.10]      Priority: Low       Subjective:     No current symptoms over 24 hours    Prior Cardiac History/Testing:   Cath 11/13/21  1.The left main coronary artery is a large vessel, which bifurcates into   the left anterior  descending artery and left circumflex artery. There are   no significant angiographic stenoses.   2. The left anterior descending artery is a large caliber vessel  that   gives off 1 small diagonal branch. There is an 80% stenosis involving the   proximal edge of prior long mid LAD stent. There are no other significant   areas of disease i nthe LAD   3. The left circumflex artery is a large caliber vessel, which gives off 2   major obtuse marginal artery(s). There is a 95% proximal LCx stenosis at   the bifurcation of hte OM1 and remaining LCx ([place] 1:1:1)   4. The right coronary artery is a  large dominant vessel. There is a   60-70% prox/mid lesion (heavily calcific) with a 60% lesion in the mid   RCA. There is a 60% ostial RPDA stenosis.     iFR   An IL3.5 guiding catheter was advanced to LM. A 0.014" Runthrough was   advanced to the distal LAD.  An Ascist Navus catheter was an advanced but   could not enter the previously  placed stent. Resting iFR = 0.88 which is   hemodynamically signfiicant.     We attempted iFR of the prox/mid RCA but again Navus iFR catheter could   not pass likely due to high-grade eccentric lesion.     CONCLUSION:   1. Multivessel CAD involving LAD, LCx, and RCA. LCx is likely culprit for   symptoms. LAD lesion is hemodynamically significant by iFR. Unable to pass   iFR catheter distal to prox RCA lesion         Echo 08/19/21  1. Normal left ventricular systolic function.  2. Estimated ejection fraction 65-70%.  3. Mild aortic root dilation 3.8 cm.  4. Trace to mild mitral regurgitation.  5. Trace to mild tricuspid regurgitation.      Physical Exam:     Vitals:    11/24/21 1135   BP: 126/72   Pulse: (!) 51   Resp: 19   Temp: 98.1 F (36.7 C)   SpO2: 98%     Temp (24hrs), Avg:98.2 F (36.8 C), Min:98.1 F (36.7 C), Max:98.4 F (36.9 C)    Weight: 88.9 kg (195 lb 14.4 oz)    Intake and Output Summary (Last 24 hours) at Date Time    Intake/Output Summary (Last 24 hours) at 11/24/2021  1208  Last data filed at 11/23/2021 2152  Gross per 24 hour   Intake 741.38 ml   Output --   Net 741.38 ml       General:  Well-nourished, well-developed.  Alert and in no apparent distress.  Eyes:  Anicteric sclera.  Pupils equal and round.    ENT:  Hearing grossly intact. Lips moist, color appropriate for race.  Neck:  2+ carotids bilaterally with normal upstroke; no carotid bruits.  No JVD.  No LAD.  Lungs:  Clear to auscultation bilaterally. No wheezes or crackles.  Respiratory effort unlabored, chest expansion symmetric.  Cardiovascular:  PMI non-displaced.  No tenderness to palpation.  Regular rate and rhythm. Normal S1 and S2.  No S3 or S4.  No murmurs or rubs.  Abdomen:  Soft, non-tender, non-distended.  No organomegaly.  No pulsatile masses.  Extremities:  2+ bilateral radial and pedal pulses.  No edema.  Skin:  Warm, dry, and intact. No rashes or lesions on exposed portions of skin.  Neurologic:  Moving all four extremities; exam grossly non-focal    Medications:      heparin infusion 25,000 units/500 mL (Cardiac/Low Intensity) 10.3 Units/kg/hr (11/23/21 0981)     Current Facility-Administered Medications   Medication Dose Route Frequency    aspirin EC  81 mg Oral Daily    atorvastatin  80 mg Oral QHS    ezetimibe  10 mg Oral QHS    famotidine  20 mg Oral Q12H SCH    Or    famotidine  20 mg Intravenous Q12H SCH    metoprolol tartrate  25 mg Oral Q12H SCH    ranolazine  1,000 mg Oral Q12H SCH       Labs:     Recent CMP   Recent Labs     11/24/21  0430   Glucose 99   BUN 14.0   Creatinine 1.1   Sodium 135   Potassium 4.0   CO2 21   Calcium 8.3*   Albumin 3.7   AST (SGOT) 74*   ALT 69*   Globulin 3.1     Recent CARDIAC ENZYMES No results for input(s): TROPONIN, ISTATTROPONI, CK in the last 24 hours.  Invalid input(s): TROPONINT, CKMB[24  Recent TSH Invalid input(s): TSH[24  Recent PT/PTT No results for input(s): INR, PTT in the last 24 hours.    Invalid input(s): PTI, COUM, COUMP, ACOAG, ACOAP[24  Recent  CBC WITH DIFF   Recent Labs     11/24/21  0430   RBC 4.94   Hgb 15.5   Hematocrit 44.5   MCV 90.1   MCHC 34.8   RDW 12   MPV 10.5   Platelets 197     Recent LIPID PANEL No results for input(s): CHOL, TRIG, HDL in the last 24 hours.    Invalid input(s): LDLC, VLDLC, LRAT[24    Rads:     Radiology Results (24 Hour)       ** No results found for the last 24 hours. **            Cardiographics:   Telemetry:  NSR 70s

## 2021-11-24 NOTE — UM Notes (Signed)
PATIENT NAME: Raymond Cook,Raymond Cook   DOB: 1966-07-20   PMH:  has a past medical history of Coronary artery disease, Hyperlipidemia, NSTEMI (non-ST elevated myocardial infarction), SOB (shortness of breath) on exertion, and Stented coronary artery.   PSH:  has a past surgical history that includes Coronary angioplasty.     11/23/21 1231  Adult Admit to Inpatient         ADMISSION REVIEW   History of present illness: Pt is a 55 y.o. male arrived at [facility] on 11/23/2021 at 0936.  Raymond Cook is a 55 y.o. male with PMHx of NSTEMI, CAD w/ stent placed, HLD, who presents with chest pain that awoke him from sleep at 04 30.  Patient has been having a month of progressive breathlessness on exertion which prompted an outpatient stress test found to be abnormal had a cardiac cath 2 weeks ago demonstrating multivessel disease he is awaiting cardiothoracic surgery consultation scheduled for December 14.  He has occasionally been having chest pain symptoms for which he was prescribed nitroglycerin.  He states is not the first episode of chest pain at rest although it is noted that this chest pain awoke him from sleep radiating to the neck associate with diaphoresis.  He experienced the onset at 430 took nitroglycerin and went back to sleep awoke at 6 AM with nausea discussion with his wife prompted 911 call as the nausea was felt to possibly be related to underlying coronary occlusive disease.     He took 4 baby aspirin at 7 AM with associated symptoms.  He was given 2 baby aspirin by EMS.  He took his own nitroglycerin and was given nitroglycerin in route.       Arrival VS: Vitals BP: 160/89, Temp: 97.5 F (36.4 C), Temp Source: Temporal, Heart Rate: 63, Resp Rate: 18, SpO2: 95 %, Height: 177.8 cm (5\' 10" ), Weight: 88.5 kg (195 lb)      Abnormal Labs:   11/23/21 10:09   Glucose 116 (H)       Medications in ED: Heparin bolus then gtt, Zofran IV x1, Nitro SL x1.     Admit to IP status, Dx Unstable angina [I20.0],  [facility]  Cardiology/CV surgery consult  EP consult  Heparin gtt    MD plan:   Admitting for unstable angina.  ED consulted [facility] cards; initial impression is may need to expedite cabg eval.  Heparin, asa statin bb etc.    Nitro / morphine.  f/u full cards recs.  Trend trops.  Full code  Anticipate >2 night stay; high risk of deterioration due to cad  35 mins spent with the pt.  Home medications reviewed.  Med rec complete; see navigator    Cardiology Consult Notes:   Assessment and Plan:      Chest Pain, Concerning for Unstable Angina  Patient with known MV CAD awaiting revascularization with CABG next month presents with chest pain. He was woken up at 4 AM with severe substernal angina, he took NTG and went back to sleep. He awoke again at 6 Am with another episode of substernal chest pain this time associated with n/v. He thus presented to ED.  Trop negative x 1. EKG with non specc ST changes. Echo 07/2021 EF 65-70%     - Of note he has chronic chest pain and is on ranexa but this was an escalation in symptoms than his baseline  - continue to trend trops  - SL NTG PRN chest pain,  if becomes refractory to SL NTG and SBP allows recommend NTG gtt  - resume Ranexa  - although troponin is sp far negative and EKG no acute changes, his history is concerning for unstable angina and thus recommend hep gtt at this time will re-eval in AM   - repeat echo this admission, none done since prior to [facility] since 07/2021  - CVS consult in AM for discussion of inpatient vs OP CABG  - continue Asa, HI statin with lipitor 80, Lopressor 25 BID and lisinopril 20  - euvolemic on exam  - close tele monitoring     MV CAD, s/p MI 2015 with DES to LAD, LHC 11/13/21 with MV CAD  Patient with known CAD with DES to LAD post MI 2015, MV CAD via LHC cath at [facility] 11/13/2021 showing Multivessel CAD involving LAD, LCx, and RCA. LCx is likely culprit for  symptoms. LAD lesion is hemodynamically significant by iFR.  Unable to pass  iFR catheter distal to prox RCA lesion. Awaiting CABG planned for next month but now presenting with worse chest pain     -CABG versus high-risk PCI recommended. Patient has scheduling for upcoming evaluation with CV surgery in December  - CVS consult    -----------------------------------------------------------  Continue stay review  11/24/21, [facility]  RA, Regular diet, NPO after MN on 11/29 for possible CABG.    Heparin gtt    VS:   I&O   Temp:  [98.1 F (36.7 C)-98.4 F (36.9 C)]   Heart Rate:  [61-75]   Resp Rate:  [13-26]   BP: (113-157)/(62-94)   SpO2:  [94 %-99 %]   Height:  [177.8 cm (5\' 10" )]   Weight:  [88.9 kg (195 lb 14.4 oz)-89.1 kg (196 lb 6.4 oz)]   BMI (calculated):  [28.2]   I/O last 3 completed shifts:  In: 741.38 [P.O.:570; I.V.:171.38]  Out: -         Abnormal Labs:   11/24/21 04:30   AST 74 (H)   ALT 69 (H)       Medications:   Current Facility-Administered Medications   Medication Dose Route Frequency    aspirin EC  81 mg Oral Daily    atorvastatin  40 mg Oral QHS    famotidine  20 mg Oral Q12H SCH    Or    famotidine  20 mg Intravenous Q12H SCH    metoprolol tartrate  25 mg Oral Q12H SCH    ranolazine  1,000 mg Oral Q12H SCH      heparin infusion 25,000 units/500 mL (Cardiac/Low Intensity) 10.3 Units/kg/hr (11/23/21 5784)     MD Notes:  Assessment:   59M with PMH of known severe CAD s/p stents on DAPT pending outpt CABG eval in Dec, HLD, BMI 28, presenting with unstable angina.      Plan:   [facility] following   CV surgery evaluated plan for CABG tomorrow 11/29  Continue heparin drip   Continue asa, statin, BB, ranexa   Echo from 11/24/2021 with preserved EF  Hold ACE/ARB and Plavix given surgical work up  If he has more chest pain can try nitropaste        Primary Payor: [employer] / Plan: [employer] SEL[employer] INSURED / Product Type: COMMERCIAL /   UTILIZATION REVIEW CONTACT: Name: Alfonso Patten, RN, MSN, CCM, ACM  Utilization Review  [facility]  Address:  [address] [place], [place]  [address]  NPI:  [id number]  Tax ID:  [id number]  Phone: [phone] (Voice Mail Only)  Fax: [phone]  Email: [email or web address]    Please use fax number [phone] to provide authorization for hospital services or to request additional information.        NOTES TO REVIEWER:    This clinical review is based on/compiled from documentation provided by the treatment team within the patient's medical record.

## 2021-11-24 NOTE — Consults (Signed)
[facility]  SURGERY CONSULTATION        Date Time: 11/24/21 4:49 PM                 Patient Name: Raymond Cook, Raymond Cook                            Age: 55 y.o.  Attending Physician: Alan Mulder, MD             Gender: male  Primary Care Physician: Estella Husk, DO  Race: Caucasian  Location/Room: [phone]   Referring Physician (s): Dr. Hoy Register MRN: [id number]  [facility] Account #:  [id number]  Date of Birth: September 05, 1966  Scheduled Surgeon:  Dr. Thedore Mins   Consult Performed By:  Miguel Dibble, DNP NP    ASSESSMENT:   Multivessel Coronary Artery Disease  Trace Mitral Valve Regurgitation   Unstable Angina  Hyperlipidemia  EtoH Abuse   TREATMENT PLAN:   Coronary Artery Revascularization Tuesday, November 25, 2021 with Dr. Thedore Mins pending diagnostic testing    Bilateral Carotid Doppler pending  Bilateral Lower Extremity Doppler pending  MSSA/MRSA Screening (Nares/Throat) pending  Bedside Spirometry pending  Bactroban prescribed  If CV Surgery is recommended, preoperative Amiodarone will be prescribed    Patient was educated on procedure, risks and benefits. Risks include but not limited to infection, bleeding, MI, CVA, renal failure, death. All questions were answered.   History of Presenting Illness:   Raymond Cook is a 55 y.o. male who has been referred for CV Surgery evaluation. Patient has a history of CAD with previous PCI (DES to LAD 2015) and presented to [facility] 11/13/2021 for a cath due to recurrent chest pain.  He was found to have progression of CAD and was referred for an elective CV Surgery evaluation. Patient presented [facility] 11/23/2021 with acute chest pain. Cardiac enzymes were negative and he ruled in for ACS.  His past medical history is significant for Hypertension and EtoH abuse, Remote Tobacco Abuse (1ppd x 10 years quit 1996).     Patient is independent with his ADLs and works as  Best boy.  He is right hand  dominant.  He lives with his wife and does not use any assistive devices with ambulation. He has received 5 doses of the COVID-19 vaccine.     Cardiac Presentation:   Presentation: Unstable angina  Anginal Severity: Numbers; 0-4: 1  Heart Failure:    Type: severity: N/A  Preliminary STS Adult Cardiac Surgery Risk Calculator    Mortality: 0.282% Morbidity/Mortality:4.239%   Surgeon Discussed with Pt/Family: y/n/unavailable: Not at time of  consult  STS Data Points:   Diabetes: [] Yes [x] No    Control:  [x] None, [] Diet, [] Oral, [] Insulin, [] Other injectable  Dyslipidemia: [x] Yes [] No  HTN: [] Yes [x] No   Chronic Lung Disease: [x] None [] mild [] moderate [] severe [] unknown   PFT/Spirometry: pending   Current use of Inhalers/Steroids: [x] Yes [] No  Sleep Apnea: [] Yes [x] No  Pneumonia: [] Yes [x] No  Depression: [] Yes [x] No  Liver Disease: [] Yes [x] No  Cirrhosis [] Yes [x] No  If Yes, Child-Pugh Class [] A [] B [] C [] unknown  PAD/PVD: [] Yes [x] No  Immunocompromised: [] Yes [x] No  Cancer (within 5 years): [] Yes [x] No  Chest Wall Deformity: [] Yes [x] No  Mediastinal Radiation: [] Yes [x] No  Syncope (cardiac-related/within 1 year): [] Yes [x] No  Arrhythmias: [] Yes [x] No   Hx of Endocarditis: [] Yes [x] No [] Active [] Treated [] Organism  Illicit Drug Use [] Yes [] No   IV drug  use within 1 year [] Yes [x] No   Any drug use within 30 days of procedure [] Yes [x] No    Tobacco: [] Yes [x] No 1ppd x 10 years quit 1996  EtOH: [x] Yes [] No 1 bottle wine/day    Medical History     Past Medical History:   Diagnosis Date    Coronary artery disease     Hyperlipidemia     NSTEMI (non-ST elevated myocardial infarction)     SOB (shortness of breath) on exertion     Stented coronary artery      Past Surgical History:   History of Cardiothoracic Surgery, PCI, Cardioversion, Pacer/AICD, etc.   Past Surgical History:   Procedure Laterality Date    CORONARY ANGIOPLASTY       Family History:   Family History Premature CAD: (Male <55, Male <65)  Family History    Problem Relation Age of Onset    Breast cancer Mother     Diabetes Father         type I    Leukemia Father      Social History:     Social History     Socioeconomic History    Marital status: Married     Spouse name: Not on file    Number of children: Not on file    Years of education: Not on file    Highest education level: Not on file   Occupational History    Not on file   Tobacco Use    Smoking status: Former     Packs/day: 1.00     Years: 21.00     Pack years: 21.00     Types: Cigarettes    Smokeless tobacco: Never    Tobacco comments:     quit 2008    Substance and Sexual Activity    Alcohol use: Yes     Alcohol/week: 2.0 standard drinks     Types: 1 Glasses of wine, 1 Cans of beer per week     Comment: 3-4 drinks 5 days/week    Drug use: No    Sexual activity: Not on file   Other Topics Concern    Not on file   Social History Narrative    Not on file     Social Determinants of Health     Financial Resource Strain: Not on file   Food Insecurity: Not on file   Transportation Needs: Not on file   Physical Activity: Not on file   Stress: Not on file   Social Connections: Not on file   Intimate Partner Violence: Not on file   Housing Stability: Not on file     Allergies:   No Known Allergies  Labs:     Recent Labs     11/24/21  0430 11/23/21  1009   WBC 6.88 8.62   Hgb 15.5 15.1   Hematocrit 44.5 42.4     Recent Labs     11/23/21  1111   PTT 30      Recent Labs     11/24/21  0430 11/23/21  1009   Sodium 135 135   Potassium 4.0 4.1   Chloride 104 105   CO2 21 21   BUN 14.0 17.0   Creatinine 1.1 1.0   Glucose 99 116*   Calcium 8.3* 8.9   Magnesium 1.9  --      Recent Labs   Lab 11/23/21  1948 11/23/21  1627 11/23/21  1009   Troponin I <0.01 <0.01 <0.01  Radiology and Test Review:     Echocardiogram Adult Complete W Clr/ Dopp Waveform   Final Result      XR Chest  AP Portable   Final Result      No acute thoracic abnormality.      Nonda Lou, MD    11/23/2021 10:47 AM      US Carotid Duplex Dopp Comp  Bilateral    (Results Pending)   US Venous Low Extrem Duplx Dopp Comp Bilat    (Results Pending)       Transthoracic Echocardiogram   Echo Results       Procedure Component Value Units Date/Time    Echocardiogram Adult Complete W Clr/ Dopp Waveform [[id number]] Collected: 11/24/21 0728     Updated: 11/24/21 1142     Site RV Size (AS) normal in size     RV Function Normal     Site RA Size (AS) normal in size     MV Regurgitation Severity trace     TV Regurgitation Severity trace     AV Regurgitation Severity no     Aortic Stenosis Severity no     Site PV Stenosis (AS) no Doppler evidence of pulmonic valve sclerosis or stenosis     PV Regurgitation Severity no     Atrial Septum Findings No evidence of interatrial shunt by color Doppler.     Aortic Valve Findings The aortic valve is tricuspid.     Aortic Valve Findings There is no aortic stenosis.     Aortic Valve Findings There is no aortic regurgitation.     Pulmonary Valve Findings The pulmonic valve is structurally normal.     Pulmonary Valve Findings There is no pulmonic regurgitation.     Mitral Valve Findings The mitral valve is structurally normal.     Mitral Valve Findings There is trace mitral regurgitation.     Tricuspid Valve Findings The tricuspid valve is structurally normal.     Tricuspid Valve Findings There is trace tricuspid regurgitation.     Tricuspid Valve Findings No pulmonary hypertension with estimated right ventricular systolic pressure of 24 mmHg.     IVS Diastolic Thickness (2D) 1     LVID diastole (2D) 4.9     LVID systole (2D) 3     BP Mod LV Ejection Fraction 59.742     LA Dimension (2D) 3.4     AV Mean Gradient 3     AV Peak Velocity 116     AV Area (Cont Eq VTI) 2.872     AV Area (Cont Eq VTI) 2.87     Ao Root Diameter (2D) 3.3     Prox Ascending Aorta Diameter 3.3     MV E/A 1.362     MV E/A 1.4     RV Basal Diastolic Dimension 3.6     TAPSE 2.36     MV Area (PHT) 4.598     LA Volume Index (BP A-L) 0.02     MV E/e' (Average) 6.671     No  Prior TTE True    Narrative:      Name:     Majesty Oehlert  Age:     55 years  DOB:     10-08-1966  Gender:     Male  MRN:     [id number]  Wt:     196 lb  BSA:     2.11 m2  Systolic BP:     213 mmHg  Diastolic BP:  73 mmHg  Technical Quality:     Adequate  Exam Date/Time:     11/24/2021 7:28 AM  Study Site:     [facility]  Exam Type:     ECHOCARDIOGRAM ADULT COMPLETE W CLR/ DOPP WAVEFORM    Study Info  Indications      unstable angina -  Procedure    Complete two-dimensional, color flow and spectral Doppler transthoracic  echocardiogram is performed.    Reading Physician:     Enis Gash MD    Staff  Sonographer:     Tonette Lederer RDCS  Ordering Physician:     Milus Mallick    70 in      History/Risk Factors  Hypertension:     Yes  Hyperlipidemia:     Yes  Coronary Artery Disease     Yes      Summary    * The left ventricle is normal in size (LVEDD: 4.9 cm, LVESD: 3.0 cm).    * Left ventricular systolic function is normal with an ejection fraction by  Biplane Method of Discs of  60 %.    * Normal right ventricular systolic function.    * Atria are normal in size.    * No significant valvular abnormalities.    * Compared to the prior study on 01/19/2017, no major changes are noted.      Findings  Left Ventricle    The left ventricle is normal in size (LVEDD: 4.9 cm, LVESD: 3.0 cm). There  is concentric left ventricular remodeling. Left ventricular systolic function  is normal with an ejection fraction by Biplane Method of Discs of  60 %. Left  ventricular segmental wall motion is normal. Left ventricular diastolic  filling parameters demonstrate normal diastolic function.  Right Ventricle    The right ventricular cavity size is normal in size. Normal right  ventricular systolic function. TAPSE = 2.4 cm, S' = 12 cm/s, fractional area  change = 40 %.      Left Atrium    The left atrium is normal in size.    Right Atrium    The right atrium is normal in size.    Atrial Septum    No evidence of interatrial shunt by color  Doppler.      Aortic Valve    The aortic valve is tricuspid. There is no aortic stenosis. There is no  aortic regurgitation.    Pulmonary Valve    The pulmonic valve is structurally normal. There is no pulmonic  regurgitation.    Mitral Valve    The mitral valve is structurally normal. There is trace mitral  regurgitation.    Tricuspid Valve    The tricuspid valve is structurally normal. There is trace tricuspid  regurgitation. No pulmonary hypertension with estimated right ventricular  systolic pressure of 24 mmHg.      Pericardium / Pleural Effusion    No pericardial effusion visualized.    Inferior Vena Cava    The IVC is normal in size with > 50% respiratory variance consistent with  normal RA pressure of 3 mmHg.    Aorta    The aortic root is normal in size. The ascending aorta is normal in size.      Measurements  2D Measurements  ----------------------------------------------------------------------  Name                                 Value  Normal  ----------------------------------------------------------------------    Parasternal 2D  ----------------------------------------------------------------------   IVS Diastolic Thickness  (2D)                               1.00 cm     0.60-1.00   LVID Diastole (2D)                 4.90 cm     4.20-5.80    LVIW Diastolic Thickness  (2D)                               1.10 cm     0.60-1.05   LVID Systole (2D)                  3.00 cm     2.50-4.00   LVOT Diameter                      2.10 cm                 LA Dimension (2D)                  3.40 cm     3.00-4.10   Ao Root Diameter (2D)              3.30 cm     2.70-3.70   Prox Asc Ao Diameter               3.30 cm     2.60-3.40     LV Ejection Fraction 2D  ----------------------------------------------------------------------  LV EF (BP MOD)                        60 %         52-72     Apical 2D Dimensions  ----------------------------------------------------------------------   RV Basal  Diastolic  Dimension                          3.60 cm     2.50-4.10   LA Volume Index (BP A-L)        20.37 ml/m2       <=34.00   RA Area (4C)                     14.10 cm2       <=18.00  M-mode Measurements  ----------------------------------------------------------------------  Name                                 Value        Normal  ----------------------------------------------------------------------    M-Mode  ----------------------------------------------------------------------  TAPSE                              2.36 cm        >=1.60  LVOT/Aortic Valve Doppler Measurements  ----------------------------------------------------------------------  Name                                 Value        Normal  ----------------------------------------------------------------------    LVOT Doppler  ----------------------------------------------------------------------  LVOT Peak Velocity  1.01 m/s                   AoV Doppler  ----------------------------------------------------------------------  AV Peak Velocity                  1.16 m/s                 AV Peak Gradient                    5 mmHg                 AV Mean Gradient                    3 mmHg           <20   AV Area (Cont Eq VTI)             2.87 cm2        >=3.00   AV V1/V2 Ratio                        0.87  RVOT/Pulmonic Valve Doppler Measurements  ----------------------------------------------------------------------  Name                                 Value        Normal  ----------------------------------------------------------------------    PV Doppler  ----------------------------------------------------------------------  PV Peak Velocity                  0.98 m/s  Mitral Valve Measurements  ----------------------------------------------------------------------  Name                                 Value        Normal  ----------------------------------------------------------------------    MV  Doppler  ----------------------------------------------------------------------  MV E Peak Velocity                0.74 m/s                 MV A Peak Velocity                0.54 m/s                 MV E/A                                1.36                   MV Annular TDI  ----------------------------------------------------------------------  MV Septal e' Velocity             0.09 m/s        >=0.08   MV E/e' (Septal)                      8.01        <=8.00   MV Lateral e' Velocity            0.14 m/s        >=0.10   MV E/e' (Lateral)                     5.33        <=8.00  Tricuspid Valve Measurements  ----------------------------------------------------------------------  Name  Value        Normal  ----------------------------------------------------------------------    TV Regurgitation Doppler  ----------------------------------------------------------------------  RA Pressure                         3 mmHg           <=3  Aorta / Venous Measurements  ----------------------------------------------------------------------  Name                                 Value        Normal  ----------------------------------------------------------------------    IVC/SVC  ----------------------------------------------------------------------  IVC Diameter (Exp 2D)              1.50 cm        <=2.10            LEFT HEART CATHERIZATION 11/13/2021 [facility] Dr. Verlee Monte  1.The left main coronary artery is a large vessel, which bifurcates into   the left anterior descending artery and left circumflex artery. There are   no significant angiographic stenoses.   2. The left anterior descending artery is a large caliber vessel  that   gives off 1 small diagonal branch. There is an 80% stenosis involving the   proximal edge of prior long mid LAD stent. There are no other significant   areas of disease i nthe LAD   3. The left circumflex artery is a large caliber vessel, which gives  off 2   major obtuse marginal artery(s). There is a 95% proximal LCx stenosis at   the bifurcation of hte OM1 and remaining LCx ([place] 1:1:1)   4. The right coronary artery is a  large dominant vessel. There is a   60-70% prox/mid lesion (heavily calcific) with a 60% lesion in the mid   RCA. There is a 60% ostial RPDA stenosis.     iFR   An IL3.5 guiding catheter was advanced to LM. A 0.014" Runthrough was   advanced to the distal LAD.  An Ascist Navus catheter was an advanced but   could not enter the previously placed stent. Resting iFR = 0.88 which is   hemodynamically signfiicant.     We attempted iFR of the prox/mid RCA but again Navus iFR catheter could   not pass likely due to high-grade eccentric lesion.     CONCLUSION:   1. Multivessel CAD involving LAD, LCx, and RCA. LCx is likely culprit for   symptoms. LAD lesion is hemodynamically significant by iFR. Unable to pass   iFR catheter distal to prox RCA lesion     PLAN:   1. Heart team approach for CABG vs multivessel PCI  Medications:   Current Inpatient :  Current Facility-Administered Medications   Medication Dose Route Frequency    aspirin EC  81 mg Oral Daily    atorvastatin  80 mg Oral QHS    ezetimibe  10 mg Oral QHS    famotidine  20 mg Oral Q12H SCH    Or    famotidine  20 mg Intravenous Q12H SCH    metoprolol tartrate  25 mg Oral Q12H SCH    mupirocin   Nasal BID    ranolazine  1,000 mg Oral Q12H [facility]     Home Medications:  Prior to Admission medications    Medication Sig Start Date End Date Taking? Authorizing Provider   aspirin EC 81 MG  EC tablet Take 81 mg by mouth daily.   Yes [provider]   atorvastatin (LIPITOR) 80 MG tablet TAKE 1 TABLET BY MOUTH EVERY DAY 01/13/17  Yes Mafi, Shahryar, MD   esomeprazole (NEXIUM) 20 MG capsule Take 20 mg by mouth every other day.      Yes [provider]   lisinopril (PRINIVIL,ZESTRIL) 20 MG tablet TAKE 1 TABLET BY MOUTH DAILY 11/30/16  Yes Mafi, Shahryar, MD   metoprolol tartrate  (LOPRESSOR) 25 MG tablet TAKE 1 TABLET BY MOUTH TWICE A DAY 01/13/17  Yes Mafi, Shahryar, MD   Nutritional Supplements (NITRO-PRO PO) Take by mouth   Yes [provider]   Omega-3 Fatty Acids (OMEGA-3 FISH OIL) 500 MG Cap Take by mouth 2 (two) times daily.   Yes [provider]   ranolazine (RANEXA) 500 MG 12 hr tablet Take 1,000 mg by mouth 2 (two) times daily   Yes [provider]   Vitamins-Lipotropics (vitamin B complex, lipotropic,) tablet Take 1 tablet by mouth daily   Yes [provider]     Review of Systems:   All other systems were reviewed and are negative except:  Pertinent items are noted in HPI.  Physical Exam:     VITAL SIGNS   Temp:  [98.1 F (36.7 C)-98.4 F (36.9 C)] 98.4 F (36.9 C)  Heart Rate:  [51-75] 57  Resp Rate:  [16-19] 18  BP: (113-141)/(62-81) 129/66       Intake/Output Summary (Last 24 hours) at 11/24/2021 1649  Last data filed at 11/23/2021 2152  Gross per 24 hour   Intake 385.71 ml   Output --   Net 385.71 ml        General Appearance:  alert, well appearing, and in no distress  Mental status:  alert, oriented to person, place, and time  Neuro:  alert, oriented, normal speech, no focal findings or movement disorder noted  Neck: supple, no significant adenopathy  Lungs: clear to auscultation, no wheezes, rales or rhonchi, symmetric air entry  Cardiac: normal rate, regular rhythm, normal S1, S2, no murmurs, rubs, clicks or gallops  Abdomen:  soft, nontender, nondistended, no masses or organomegaly  Extremities: peripheral pulses normal, no pedal edema, no clubbing or cyanosis   Skin:   normal coloration and turgor, no rashes, no suspicious skin lesions noted    Discussed w/ Dr. Thedore Mins  Signed by: Miguel Dibble, DNP NP  [id number], Oliver Barre, DO

## 2021-11-24 NOTE — Progress Notes (Signed)
11/24/21 1920   Vitals   Heart Rate (!) 58   BP 146/85   MAP (mmHg) (!) 111   Oxygen Therapy   SpO2 97 %   O2 Device None (Room air)     Patient transported to [place] department for mapping, then transported to X-ray department for a 2 view X-ray. Patient is alert and oriented x4, denies any chest pain, on Heparin. Pt completed the exams without any acute events, transported back to the unit, handoff to nurse Reuel Boom, RN.

## 2021-11-24 NOTE — Plan of Care (Signed)
Cardiac symptoms: denies   Diet: cardiac   Fluid Restriction? N/A   Mobility: standby   OP CABG workup included  STAT 2VCXR & BLE + carotid US in progress  Covid test neg, MRSA/MSSA swabs pending  Bactroban pending  Amio PO added    Nursing Plan:   CABG 11/29 1410 w/ Dr. Thedore Mins  Monitor cardiac s/s   Next anti-Xa @ 0400       Problem: Pre-op Phase - Cardiac Surgery  Goal: Patient will be ready for surgery  Outcome: Progressing     Problem: Safety  Goal: Patient will be free from injury during hospitalization  Outcome: Progressing  Goal: Patient will be free from infection during hospitalization  Outcome: Progressing     Problem: Pain  Goal: Pain at adequate level as identified by patient  Outcome: Progressing

## 2021-11-25 ENCOUNTER — Inpatient Hospital Stay: Payer: No Typology Code available for payment source | Admitting: Anesthesiology

## 2021-11-25 ENCOUNTER — Inpatient Hospital Stay: Payer: No Typology Code available for payment source

## 2021-11-25 ENCOUNTER — Encounter: Payer: Self-pay | Admitting: Internal Medicine

## 2021-11-25 ENCOUNTER — Encounter
Admission: EM | Disposition: A | Discharge status: Home Health | Payer: Self-pay | Source: Home / Self Care | Attending: Thoracic Surgery (Cardiothoracic Vascular Surgery)

## 2021-11-25 DIAGNOSIS — I25110 Atherosclerotic heart disease of native coronary artery with unstable angina pectoris: Principal | ICD-10-CM

## 2021-11-25 HISTORY — PX: ENDOSCOPIC,VEIN HARVEST: SHX3864

## 2021-11-25 HISTORY — PX: CORONARY ARTERY BYPASS: SHX3487

## 2021-11-25 HISTORY — PX: ENDOSCOPIC, RADIAL ARTERY HARVEST: SHX3863

## 2021-11-25 LAB — ABG WITH NA/K/CA IONIZED
Arterial Total CO2: 21.7 mEq/L — ABNORMAL LOW (ref 24.0–30.0)
Arterial Total CO2: 21.3 mEq/L — ABNORMAL LOW (ref 24.0–30.0)
Arterial Total CO2: 22.7 mEq/L — ABNORMAL LOW (ref 24.0–30.0)
Arterial Total CO2: 25.8 mEq/L (ref 24.0–30.0)
Arterial Total CO2: 22.6 mEq/L — ABNORMAL LOW (ref 24.0–30.0)
Arterial Total CO2: 25.8 mEq/L (ref 24.0–30.0)
Arterial Total CO2: 23.5 mEq/L — ABNORMAL LOW (ref 24.0–30.0)
Arterial Total CO2: 24.1 mEq/L (ref 24.0–30.0)
Base Excess, Arterial: -2.4 mEq/L — ABNORMAL LOW (ref <=2.0)
Base Excess, Arterial: -5.4 mEq/L — ABNORMAL LOW (ref <=2.0)
Base Excess, Arterial: -0.4 mEq/L (ref <=2.0)
Base Excess, Arterial: -3.5 mEq/L — ABNORMAL LOW (ref <=2.0)
Base Excess, Arterial: -0.5 mEq/L (ref <=2.0)
Base Excess, Arterial: -3.6 mEq/L — ABNORMAL LOW (ref <=2.0)
Base Excess, Arterial: -2.5 mEq/L — ABNORMAL LOW (ref <=2.0)
Base Excess, Arterial: -2.5 mEq/L — ABNORMAL LOW (ref <=2.0)
Calcium, Ionized: 2.2 mEq/L — ABNORMAL LOW (ref 2.30–2.58)
Calcium, Ionized: 2.18 mEq/L — ABNORMAL LOW (ref 2.30–2.58)
Calcium, Ionized: 1.85 mEq/L — ABNORMAL LOW (ref 2.30–2.58)
Calcium, Ionized: 2.07 mEq/L — ABNORMAL LOW (ref 2.30–2.58)
Calcium, Ionized: 2.01 mEq/L — ABNORMAL LOW (ref 2.30–2.58)
Calcium, Ionized: 2.35 mEq/L (ref 2.30–2.58)
Calcium, Ionized: 2.1 mEq/L — ABNORMAL LOW (ref 2.30–2.58)
Calcium, Ionized: 2.36 mEq/L (ref 2.30–2.58)
FIO2: 40 %
HCO3, Arterial: 20.7 mEq/L — ABNORMAL LOW (ref 23.0–29.0)
HCO3, Arterial: 20.1 mEq/L — ABNORMAL LOW (ref 23.0–29.0)
HCO3, Arterial: 21.4 mEq/L — ABNORMAL LOW (ref 23.0–29.0)
HCO3, Arterial: 24.5 mEq/L (ref 23.0–29.0)
HCO3, Arterial: 21.4 mEq/L — ABNORMAL LOW (ref 23.0–29.0)
HCO3, Arterial: 24.4 mEq/L (ref 23.0–29.0)
HCO3, Arterial: 22.3 mEq/L — ABNORMAL LOW (ref 23.0–29.0)
HCO3, Arterial: 22.8 mEq/L — ABNORMAL LOW (ref 23.0–29.0)
O2 Sat, Arterial: 97.9 % (ref 95.0–100.0)
O2 Sat, Arterial: 97.5 % (ref 95.0–100.0)
O2 Sat, Arterial: 97.6 % (ref 95.0–100.0)
O2 Sat, Arterial: 97.7 % (ref 95.0–100.0)
O2 Sat, Arterial: 97.6 % (ref 95.0–100.0)
O2 Sat, Arterial: 98.8 % (ref 95.0–100.0)
O2 Sat, Arterial: 99.3 % (ref 95.0–100.0)
O2 Sat, Arterial: 95.5 % (ref 95.0–100.0)
PEEP: 5
Pressure Support: 5
Temperature: 36.5
Temperature: 34.2
Temperature: 37
Temperature: 37
Temperature: 36
Temperature: 37
Temperature: 36
Temperature: 35.6
Whole Blood Potassium: 4.1 mEq/L (ref 3.5–5.3)
Whole Blood Potassium: 4.6 mEq/L (ref 3.5–5.3)
Whole Blood Potassium: 4.6 mEq/L (ref 3.5–5.3)
Whole Blood Potassium: 6.5 mEq/L (ref 3.5–5.3)
Whole Blood Potassium: 5.2 mEq/L (ref 3.5–5.3)
Whole Blood Potassium: 6.6 mEq/L (ref 3.5–5.3)
Whole Blood Potassium: 4.3 mEq/L (ref 3.5–5.3)
Whole Blood Potassium: 4.3 mEq/L (ref 3.5–5.3)
Whole Blood Sodium: 138 mEq/L (ref 136–146)
Whole Blood Sodium: 139 mEq/L (ref 136–146)
Whole Blood Sodium: 135 mEq/L — ABNORMAL LOW (ref 136–146)
Whole Blood Sodium: 137 mEq/L (ref 136–146)
Whole Blood Sodium: 135 mEq/L — ABNORMAL LOW (ref 136–146)
Whole Blood Sodium: 136 mEq/L (ref 136–146)
Whole Blood Sodium: 138 mEq/L (ref 136–146)
Whole Blood Sodium: 139 mEq/L (ref 136–146)
pCO2, Arterial: 32 mmhg — ABNORMAL LOW (ref 35.0–45.0)
pCO2, Arterial: 36 mmhg (ref 35.0–45.0)
pCO2, Arterial: 40.7 mmhg (ref 35.0–45.0)
pCO2, Arterial: 44.1 mmhg (ref 35.0–45.0)
pCO2, Arterial: 39 mmhg (ref 35.0–45.0)
pCO2, Arterial: 44.5 mmhg (ref 35.0–45.0)
pCO2, Arterial: 39 mmhg (ref 35.0–45.0)
pCO2, Arterial: 40.8 mmhg (ref 35.0–45.0)
pH, Arterial: 7.425 (ref 7.350–7.450)
pH, Arterial: 7.348 — ABNORMAL LOW (ref 7.350–7.450)
pH, Arterial: 7.341 — ABNORMAL LOW (ref 7.350–7.450)
pH, Arterial: 7.363 (ref 7.350–7.450)
pH, Arterial: 7.352 (ref 7.350–7.450)
pH, Arterial: 7.359 (ref 7.350–7.450)
pH, Arterial: 7.369 (ref 7.350–7.450)
pH, Arterial: 7.357 (ref 7.350–7.450)
pO2, Arterial: 263 mmhg — ABNORMAL HIGH (ref 80.0–90.0)
pO2, Arterial: 116 mmhg — ABNORMAL HIGH (ref 80.0–90.0)
pO2, Arterial: 264 mmhg — ABNORMAL HIGH (ref 80.0–90.0)
pO2, Arterial: 292 mmhg — ABNORMAL HIGH (ref 80.0–90.0)
pO2, Arterial: 259 mmhg — ABNORMAL HIGH (ref 80.0–90.0)
pO2, Arterial: 277 mmhg — ABNORMAL HIGH (ref 80.0–90.0)
pO2, Arterial: 332 mmhg — ABNORMAL HIGH (ref 80.0–90.0)
pO2, Arterial: 87.1 mmhg (ref 80.0–90.0)

## 2021-11-25 LAB — COOXIMETRY PROFILE
Carboxyhemoglobin: 0.9 % (ref 0.0–3.0)
Carboxyhemoglobin: 1 % (ref 0.0–3.0)
Hematocrit Total Calculated: 28.4 % — ABNORMAL LOW (ref 40.0–54.0)
Hematocrit Total Calculated: 31.1 % — ABNORMAL LOW (ref 40.0–54.0)
Hemoglobin Total: 9.2 g/dL — ABNORMAL LOW (ref 13.0–17.0)
Hemoglobin Total: 10.1 g/dL — ABNORMAL LOW (ref 13.0–17.0)
Methemoglobin: 1.3 % (ref 0.0–3.0)
Methemoglobin: 1.6 % (ref 0.0–3.0)
O2 Content: 9.8 Vol %
O2 Content: 11.5 Vol %
Oxygenated Hemoglobin: 76.1 % — ABNORMAL LOW (ref 85.0–98.0)
Oxygenated Hemoglobin: 81 % — ABNORMAL LOW (ref 85.0–98.0)

## 2021-11-25 LAB — BEDSIDE SPIROMETRY WITHOUT BRONCHODILATOR
FEF 25-75% (Pre-Bronch) %Pred: 96 %
FEF 25-75% (Pre-Bronch) Actual: 3.17 L/sec
FEF 25-75% (Pre-Bronch) Pred: 3.29 L/sec
FEF Max (Pre-Bronch) %Pred: 121 %
FEV1 (Lower Limit of Normal): 2.9 L
FEV1 (Pre-Bronch) %Pred: 104 %
FEV1 (Pre-Bronch) Actual: 3.94 L
FEV1/FVC (Pre-Bronch) %Pred: 101 %
FEV1/FVC (Pre-Bronch) Actual: 79 %
FEV1/FVC (Pre-Bronch) Pred: 78 %
FVC (Lower Limit of Normal): 3.73 L
FVC (Pre-Bronch) Actual: 4.97 L
FVC (Pre-Bronch) Pred: 4.83 L
PEF (Pre-Actual): 695.6 L/min

## 2021-11-25 LAB — BASIC METABOLIC PANEL
Anion Gap: 6 (ref 5.0–15.0)
BUN: 13 mg/dL (ref 9.0–28.0)
CO2: 23 mEq/L (ref 17–29)
Calcium: 8 mg/dL — ABNORMAL LOW (ref 8.5–10.5)
Chloride: 110 mEq/L (ref 99–111)
Creatinine: 0.9 mg/dL (ref 0.5–1.5)
Glucose: 142 mg/dL — ABNORMAL HIGH (ref 70–100)
Potassium: 3.8 mEq/L (ref 3.5–5.3)
Sodium: 139 mEq/L (ref 135–145)

## 2021-11-25 LAB — LACTIC ACID, PLASMA
Lactic Acid: 0.6 mmol/L (ref 0.2–2.0)
Lactic Acid: 0.6 mmol/L (ref 0.2–2.0)
Lactic Acid: 0.6 mmol/L (ref 0.2–2.0)
Lactic Acid: 0.7 mmol/L (ref 0.2–2.0)
Lactic Acid: 0.7 mmol/L (ref 0.2–2.0)
Lactic Acid: 0.7 mmol/L (ref 0.2–2.0)
Lactic Acid: 1 mmol/L (ref 0.2–2.0)

## 2021-11-25 LAB — BLOOD GAS, ARTERIAL
Arterial Total CO2: 24.6 mEq/L (ref 24.0–30.0)
Base Excess, Arterial: -1 mEq/L (ref <=2.0)
FIO2: 100 %
HCO3, Arterial: 23.4 mEq/L (ref 23.0–29.0)
O2 Sat, Arterial: 97.6 % (ref 95.0–100.0)
PEEP: 8
Rate: 14 {beats}/min
Temperature: 35.7
Tidal vol.: 580
pCO2, Arterial: 37.5 mmhg (ref 35.0–45.0)
pH, Arterial: 7.404 (ref 7.350–7.450)
pO2, Arterial: 213 mmhg — ABNORMAL HIGH (ref 80.0–90.0)

## 2021-11-25 LAB — TOTAL HEMOGLOBIN GROUP
Hematocrit Total Calculated: 43.5 % (ref 40.0–54.0)
Hematocrit Total Calculated: 37.1 % — ABNORMAL LOW (ref 40.0–54.0)
Hematocrit Total Calculated: 28.8 % — ABNORMAL LOW (ref 40.0–54.0)
Hematocrit Total Calculated: 30.3 % — ABNORMAL LOW (ref 40.0–54.0)
Hematocrit Total Calculated: 26.7 % — ABNORMAL LOW (ref 40.0–54.0)
Hematocrit Total Calculated: 30.1 % — ABNORMAL LOW (ref 40.0–54.0)
Hematocrit Total Calculated: 31.3 % — ABNORMAL LOW (ref 40.0–54.0)
Hematocrit Total Calculated: 37.4 % — ABNORMAL LOW (ref 40.0–54.0)
Hemoglobin Total: 14.2 g/dL (ref 13.0–17.0)
Hemoglobin Total: 12.1 g/dL — ABNORMAL LOW (ref 13.0–17.0)
Hemoglobin Total: 9.3 g/dL — ABNORMAL LOW (ref 13.0–17.0)
Hemoglobin Total: 9.8 g/dL — ABNORMAL LOW (ref 13.0–17.0)
Hemoglobin Total: 8.6 g/dL — ABNORMAL LOW (ref 13.0–17.0)
Hemoglobin Total: 9.7 g/dL — ABNORMAL LOW (ref 13.0–17.0)
Hemoglobin Total: 10.1 g/dL — ABNORMAL LOW (ref 13.0–17.0)
Hemoglobin Total: 12.2 g/dL — ABNORMAL LOW (ref 13.0–17.0)

## 2021-11-25 LAB — BLOOD GAS, VENOUS
Base Excess, Ven: -0.7 mEq/L
Base Excess, Ven: -3.2 mEq/L
HCO3, Ven: 24.8 mEq/L
HCO3, Ven: 22.9 mEq/L
O2 Sat, Venous: 77.8 %
O2 Sat, Venous: 83.2 %
Temperature: 37
Temperature: 37
Venous Total CO2: 26.3 mEq/L
Venous Total CO2: 24.4 mEq/L
pCO2, Venous: 48.1 mmhg
pCO2, Venous: 48.9 mmhg
pH, Ven: 7.332
pH, Ven: 7.292
pO2, Venous: 47.5 mmhg
pO2, Venous: 52.7 mmhg

## 2021-11-25 LAB — PT/INR
PT INR: 1.3 — ABNORMAL HIGH (ref 0.9–1.1)
PT: 15.7 s — ABNORMAL HIGH (ref 10.1–12.9)

## 2021-11-25 LAB — COMPREHENSIVE METABOLIC PANEL
ALT: 79 U/L — ABNORMAL HIGH (ref 0–55)
AST (SGOT): 63 U/L — ABNORMAL HIGH (ref 5–41)
Albumin/Globulin Ratio: 1 (ref 0.9–2.2)
Albumin: 3.5 g/dL (ref 3.5–5.0)
Alkaline Phosphatase: 46 U/L (ref 37–117)
Anion Gap: 11 (ref 5.0–15.0)
BUN: 14 mg/dL (ref 9.0–28.0)
Bilirubin, Total: 0.5 mg/dL (ref 0.2–1.2)
CO2: 18 mEq/L (ref 17–29)
Calcium: 8.1 mg/dL — ABNORMAL LOW (ref 8.5–10.5)
Chloride: 105 mEq/L (ref 99–111)
Creatinine: 1 mg/dL (ref 0.5–1.5)
Globulin: 3.4 g/dL (ref 2.0–3.6)
Glucose: 95 mg/dL (ref 70–100)
Potassium: 4.2 mEq/L (ref 3.5–5.3)
Protein, Total: 6.9 g/dL (ref 6.0–8.3)
Sodium: 134 mEq/L — ABNORMAL LOW (ref 135–145)

## 2021-11-25 LAB — CBC
Absolute NRBC: 0 10*3/uL (ref 0.00–0.00)
Absolute NRBC: 0 10*3/uL (ref 0.00–0.00)
Hematocrit: 42 % (ref 37.6–49.6)
Hematocrit: 30.4 % — ABNORMAL LOW (ref 37.6–49.6)
Hgb: 14.5 g/dL (ref 12.5–17.1)
Hgb: 10.6 g/dL — ABNORMAL LOW (ref 12.5–17.1)
MCH: 30.1 pg (ref 25.1–33.5)
MCH: 30.5 pg (ref 25.1–33.5)
MCHC: 34.5 g/dL (ref 31.5–35.8)
MCHC: 34.9 g/dL (ref 31.5–35.8)
MCV: 87.3 fL (ref 78.0–96.0)
MCV: 87.4 fL (ref 78.0–96.0)
MPV: 10.8 fL (ref 8.9–12.5)
MPV: 10 fL (ref 8.9–12.5)
Nucleated RBC: 0 /100 WBC (ref 0.0–0.0)
Nucleated RBC: 0 /100 WBC (ref 0.0–0.0)
Platelets: 184 10*3/uL (ref 142–346)
Platelets: 113 10*3/uL — ABNORMAL LOW (ref 142–346)
RBC: 4.81 10*6/uL (ref 4.20–5.90)
RBC: 3.48 10*6/uL — ABNORMAL LOW (ref 4.20–5.90)
RDW: 12 % (ref 11–15)
RDW: 12 % (ref 11–15)
WBC: 6.57 10*3/uL (ref 3.10–9.50)
WBC: 8.53 10*3/uL (ref 3.10–9.50)

## 2021-11-25 LAB — GLUCOSE WHOLE BLOOD - POCT
Whole Blood Glucose POCT: 104 mg/dL — ABNORMAL HIGH (ref 70–100)
Whole Blood Glucose POCT: 113 mg/dL — ABNORMAL HIGH (ref 70–100)
Whole Blood Glucose POCT: 120 mg/dL — ABNORMAL HIGH (ref 70–100)
Whole Blood Glucose POCT: 122 mg/dL — ABNORMAL HIGH (ref 70–100)
Whole Blood Glucose POCT: 124 mg/dL — ABNORMAL HIGH (ref 70–100)
Whole Blood Glucose POCT: 128 mg/dL — ABNORMAL HIGH (ref 70–100)
Whole Blood Glucose POCT: 97 mg/dL (ref 70–100)
Whole Blood Glucose POCT: 99 mg/dL (ref 70–100)

## 2021-11-25 LAB — CVOR3 ACT,HEP
ACT POCT: 146 sec. — ABNORMAL HIGH (ref 85–120)
ACT POCT: 568 sec. — ABNORMAL HIGH (ref 85–120)
ACT POCT: 598 sec. — ABNORMAL HIGH (ref 85–120)
ACT POCT: 553 sec. — ABNORMAL HIGH (ref 85–120)
ACT POCT: 122 sec. — ABNORMAL HIGH (ref 85–120)
ACT POCT: 442 sec. — ABNORMAL HIGH (ref 85–120)
ACT POCT: 503 sec. — ABNORMAL HIGH (ref 85–120)
Heparin Assay Test Concentration: 2.5 mg/kg
Heparin Assay Test Concentration: 3 mg/kg
Heparin Assay Test Concentration: 3 mg/kg
Heparin Assay Test Concentration: 2.5 mg/kg
Heparin Assay Test Concentration: 0 mg/kg
Heparin Assay Test Concentration: 2 mg/kg
Heparin Assay Test Concentration: 2 mg/kg
Total Protamine Dose: 0 mg
Total Protamine Dose: 0 mg
Total Protamine Dose: 0 mg
Total Protamine Dose: 0 mg
Total Protamine Dose: 0 mg
Total Protamine Dose: 0 mg
Total Protamine Dose: 250 mg
Total Units of Heparin Required: 30000 Units
Total Units of Heparin Required: 0 Units
Total Units of Heparin Required: 0 Units
Total Units of Heparin Required: 0 Units
Total Units of Heparin Required: 0 Units
Total Units of Heparin Required: 0 Units
Total Units of Heparin Required: 5000 Units

## 2021-11-25 LAB — RED BLOOD CELLS OR HOLD
Expiration Date: 202212262359
ISBT CODE: 9500
ISBT CODE: 9500
UTYPE: O NEG
UTYPE: O NEG

## 2021-11-25 LAB — GFR
EGFR: >60
EGFR: >60

## 2021-11-25 LAB — GLUCOSE WHOLE BLOOD
Whole Blood Glucose: 104 mg/dL — ABNORMAL HIGH (ref 70–100)
Whole Blood Glucose: 136 mg/dL — ABNORMAL HIGH (ref 70–100)
Whole Blood Glucose: 125 mg/dL — ABNORMAL HIGH (ref 70–100)
Whole Blood Glucose: 141 mg/dL — ABNORMAL HIGH (ref 70–100)
Whole Blood Glucose: 135 mg/dL — ABNORMAL HIGH (ref 70–100)
Whole Blood Glucose: 345 mg/dL — ABNORMAL HIGH (ref 70–100)
Whole Blood Glucose: 218 mg/dL — ABNORMAL HIGH (ref 70–100)
Whole Blood Glucose: 115 mg/dL — ABNORMAL HIGH (ref 70–100)

## 2021-11-25 LAB — TEG HEPARIN NEUTRALIZED
TEG Angle Neutralized: 53.4 Degree — ABNORMAL LOW (ref 55.2–78.4)
TEG CI Neutralized: 0.8 (ref <=3.00)
TEG K Time Neutralized: 1.3 Minutes (ref 0.8–2.8)
TEG R Time Neutralized: 4.4 Minutes (ref 2.5–7.5)
Teg MA Neutralized: 64.3 Minutes (ref 50.6–69.4)

## 2021-11-25 LAB — MAGNESIUM
Magnesium: 2.3 mg/dL (ref 1.6–2.6)
Magnesium: 3 mg/dL — ABNORMAL HIGH (ref 1.6–2.6)

## 2021-11-25 LAB — APTT: PTT: 28 s (ref 27–39)

## 2021-11-25 LAB — ANTI-XA,UFH: Anti-Xa, UFH: 0.36 IU/mL

## 2021-11-25 LAB — CALCIUM, IONIZED: Calcium, Ionized: 2.45 mEq/L (ref 2.30–2.58)

## 2021-11-25 LAB — PRESURGICAL SURVEILLANCE, MSSA+MRSA
Culture Staph and MRSA Surveillance: POSITIVE — AB
Culture Staph and MRSA Surveillance: POSITIVE — AB

## 2021-11-25 SURGERY — CORONARY ARTERY BYPASS
Anesthesia: Anesthesia General | Site: Leg Upper | Wound class: Clean

## 2021-11-25 MED ORDER — ROCURONIUM BROMIDE 50 MG/5ML IV SOLN
INTRAVENOUS | Status: AC
Start: 2021-11-25 — End: ?
  Filled 2021-11-25: qty 5

## 2021-11-25 MED ORDER — CALCIUM CHLORIDE 10 % IV SOLN
INTRAVENOUS | Status: AC
Start: 2021-11-25 — End: ?
  Filled 2021-11-25: qty 10

## 2021-11-25 MED ORDER — GLYCOPYRROLATE 0.2 MG/ML IJ SOLN (WRAP)
INTRAMUSCULAR | Status: AC
Start: 2021-11-25 — End: ?
  Filled 2021-11-25: qty 1

## 2021-11-25 MED ORDER — NICARDIPINE HCL 2.5 MG/ML IV SOLN
INTRAVENOUS | Status: AC
Start: 2021-11-25 — End: ?
  Filled 2021-11-25: qty 10

## 2021-11-25 MED ORDER — HYDROMORPHONE HCL 0.5 MG/0.5 ML IJ SOLN
0.2500 mg | INTRAMUSCULAR | Status: DC | PRN
Start: 2021-11-25 — End: 2021-11-26
  Administered 2021-11-25: 0.25 mg via INTRAVENOUS
  Filled 2021-11-25: qty 1

## 2021-11-25 MED ORDER — PROTAMINE SULFATE 10 MG/ML IV SOLN
INTRAVENOUS | Status: AC
Start: 2021-11-25 — End: ?
  Filled 2021-11-25: qty 25

## 2021-11-25 MED ORDER — ACETAMINOPHEN 325 MG PO TABS
650.0000 mg | ORAL_TABLET | ORAL | Status: DC | PRN
Start: 2021-11-30 — End: 2021-12-02
  Administered 2021-11-30: 650 mg via ORAL
  Filled 2021-11-25: qty 2

## 2021-11-25 MED ORDER — POTASSIUM CHLORIDE 20 MEQ/50ML IV SOLN
20.0000 meq | INTRAVENOUS | Status: DC | PRN
Start: 2021-11-25 — End: 2021-11-26
  Administered 2021-11-25: 20 meq via INTRAVENOUS
  Filled 2021-11-25: qty 50

## 2021-11-25 MED ORDER — LACTATED RINGERS IV BOLUS
250.0000 mL | INTRAVENOUS | Status: AC | PRN
Start: 2021-11-25 — End: 2021-11-25
  Administered 2021-11-25 (×4): 250 mL via INTRAVENOUS

## 2021-11-25 MED ORDER — MILRINONE LACTATE IN DEXTROSE 20-5 MG/100ML-% IV SOLN
INTRAVENOUS | Status: AC
Start: 2021-11-25 — End: ?
  Filled 2021-11-25: qty 100

## 2021-11-25 MED ORDER — NOREPINEPHRINE-DEXTROSE 8-5 MG/250ML-% IV SOLN
INTRAVENOUS | Status: AC
Start: 2021-11-25 — End: ?
  Filled 2021-11-25: qty 250

## 2021-11-25 MED ORDER — ONDANSETRON 4 MG PO TBDP
4.0000 mg | ORAL_TABLET | Freq: Three times a day (TID) | ORAL | Status: DC | PRN
Start: 2021-11-25 — End: 2021-12-02

## 2021-11-25 MED ORDER — MAGNESIUM SULFATE IN D5W 1-5 GM/100ML-% IV SOLN
1.0000 g | INTRAVENOUS | Status: DC | PRN
Start: 2021-11-25 — End: 2021-11-26

## 2021-11-25 MED ORDER — TRANEXAMIC ACID-NACL 1000-0.7 MG/100ML-% IV SOLN
INTRAVENOUS | Status: AC
Start: 2021-11-25 — End: ?
  Filled 2021-11-25: qty 100

## 2021-11-25 MED ORDER — FENTANYL CITRATE (PF) 50 MCG/ML IJ SOLN (WRAP)
INTRAMUSCULAR | Status: AC
Start: 2021-11-25 — End: ?
  Filled 2021-11-25: qty 2

## 2021-11-25 MED ORDER — SODIUM PHOSPHATES 3 MMOLE/ML IV SOLN (WRAP)
15.0000 mmol | INTRAVENOUS | Status: DC | PRN
Start: 2021-11-25 — End: 2021-11-26

## 2021-11-25 MED ORDER — GABAPENTIN 100 MG PO CAPS
200.0000 mg | ORAL_CAPSULE | Freq: Three times a day (TID) | ORAL | Status: DC
Start: 2021-11-25 — End: 2021-11-26
  Administered 2021-11-26: 200 mg via ORAL
  Filled 2021-11-25: qty 2

## 2021-11-25 MED ORDER — EPINEPHRINE-DEXTROSE 5-5 MG/250ML-% IV SOLN (WRAP)
INTRAVENOUS | Status: AC
Start: 2021-11-25 — End: ?
  Filled 2021-11-25: qty 250

## 2021-11-25 MED ORDER — POTASSIUM CHLORIDE 20 MEQ PO PACK
0.0000 meq | PACK | ORAL | Status: DC | PRN
Start: 2021-11-25 — End: 2021-11-26

## 2021-11-25 MED ORDER — CHLORHEXIDINE GLUCONATE 0.12 % MT SOLN
15.0000 mL | OROMUCOSAL | Status: AC
Start: 2021-11-25 — End: 2021-11-25

## 2021-11-25 MED ORDER — NOREPINEPHRINE BITARTRATE 1 MG/ML IV SOLN
INTRAVENOUS | Status: DC | PRN
Start: 2021-11-25 — End: 2021-11-25
  Administered 2021-11-25: 1 ug/min via INTRAVENOUS

## 2021-11-25 MED ORDER — SODIUM CHLORIDE 0.9 % IV SOLN
INTRAVENOUS | Status: DC
Start: 2021-11-25 — End: 2021-11-26

## 2021-11-25 MED ORDER — HYDRALAZINE HCL 20 MG/ML IJ SOLN
10.0000 mg | INTRAMUSCULAR | Status: DC | PRN
Start: 2021-11-25 — End: 2021-11-26
  Administered 2021-11-26: 10 mg via INTRAVENOUS
  Filled 2021-11-25: qty 1

## 2021-11-25 MED ORDER — MANNITOL 20 % IV SOLN
INTRAVENOUS | Status: AC
Start: 2021-11-25 — End: ?
  Filled 2021-11-25: qty 250

## 2021-11-25 MED ORDER — SODIUM CHLORIDE 0.9 % IV MBP
0.0000 mg/h | INTRAVENOUS | Status: DC | PRN
Start: 2021-11-25 — End: 2021-11-26
  Administered 2021-11-25: 2 mg/h via INTRAVENOUS

## 2021-11-25 MED ORDER — MUPIROCIN CALCIUM 2 % NA OINT
TOPICAL_OINTMENT | Freq: Two times a day (BID) | NASAL | Status: AC
Start: 2021-11-25 — End: 2021-11-30
  Administered 2021-11-25 – 2021-11-29 (×9): 0.9 via NASAL
  Filled 2021-11-25 (×10): qty 0.9

## 2021-11-25 MED ORDER — ACETAMINOPHEN 500 MG PO TABS
1000.0000 mg | ORAL_TABLET | Freq: Four times a day (QID) | ORAL | Status: AC
Start: 2021-11-26 — End: 2021-12-01
  Administered 2021-11-26 – 2021-11-30 (×16): 1000 mg via ORAL
  Filled 2021-11-25 (×19): qty 2

## 2021-11-25 MED ORDER — INSULIN REGULAR 100 UNITS IN 100 ML NS (PREMIX)
INTRAVENOUS | Status: AC
Start: 2021-11-25 — End: ?
  Filled 2021-11-25: qty 100

## 2021-11-25 MED ORDER — LIDOCAINE HCL 2 % IJ SOLN
INTRAMUSCULAR | Status: DC | PRN
Start: 2021-11-25 — End: 2021-11-25
  Administered 2021-11-25: 100 mg

## 2021-11-25 MED ORDER — MIDAZOLAM HCL 1 MG/ML IJ SOLN (WRAP)
INTRAMUSCULAR | Status: DC | PRN
Start: 2021-11-25 — End: 2021-11-25
  Administered 2021-11-25 (×4): 1 mg via INTRAVENOUS

## 2021-11-25 MED ORDER — HEPARIN SODIUM (PORCINE) 1000 UNIT/ML IJ SOLN
INTRAMUSCULAR | Status: DC | PRN
Start: 2021-11-25 — End: 2021-11-25
  Administered 2021-11-25: 30000 [IU] via INTRAVENOUS

## 2021-11-25 MED ORDER — PROTAMINE SULFATE 10 MG/ML IV SOLN
INTRAVENOUS | Status: DC | PRN
Start: 2021-11-25 — End: 2021-11-25
  Administered 2021-11-25: 250 mg via INTRAVENOUS

## 2021-11-25 MED ORDER — FENTANYL CITRATE (PF) 50 MCG/ML IJ SOLN (WRAP)
INTRAMUSCULAR | Status: DC | PRN
Start: 2021-11-25 — End: 2021-11-25
  Administered 2021-11-25 (×2): 200 ug via INTRAVENOUS
  Administered 2021-11-25 (×3): 100 ug via INTRAVENOUS

## 2021-11-25 MED ORDER — NOREPINEPHRINE-DEXTROSE 8-5 MG/250ML-% IV SOLN (IHS)
1.0000 ug/min | INTRAVENOUS | Status: DC | PRN
Start: 2021-11-25 — End: 2021-11-26

## 2021-11-25 MED ORDER — FAMOTIDINE 20 MG PO TABS
20.0000 mg | ORAL_TABLET | Freq: Two times a day (BID) | ORAL | Status: DC
Start: 2021-11-25 — End: 2021-12-02
  Administered 2021-11-25 – 2021-12-02 (×14): 20 mg via ORAL
  Filled 2021-11-25 (×14): qty 1

## 2021-11-25 MED ORDER — NEOSTIGMINE METHYLSULFATE 1 MG/ML IJ/IV SOLN (WRAP)
Status: AC
Start: 2021-11-25 — End: ?
  Filled 2021-11-25: qty 5

## 2021-11-25 MED ORDER — CHLORHEXIDINE GLUCONATE 0.12 % MT SOLN
15.0000 mL | Freq: Two times a day (BID) | OROMUCOSAL | Status: DC
Start: 2021-11-25 — End: 2021-11-26
  Administered 2021-11-25 – 2021-11-26 (×2): 15 mL via OROMUCOSAL
  Filled 2021-11-25 (×2): qty 15

## 2021-11-25 MED ORDER — VANCOMYCIN HCL IN NACL 1.5-0.9 GM/500ML-% IV SOLN
INTRAVENOUS | Status: AC
Start: 2021-11-25 — End: ?
  Filled 2021-11-25: qty 500

## 2021-11-25 MED ORDER — MIDAZOLAM HCL 1 MG/ML IJ SOLN (WRAP)
INTRAMUSCULAR | Status: AC
Start: 2021-11-25 — End: ?
  Filled 2021-11-25: qty 2

## 2021-11-25 MED ORDER — SODIUM CHLORIDE 0.9 % IV SOLN
INTRAVENOUS | Status: DC | PRN
Start: 2021-11-25 — End: 2021-11-25

## 2021-11-25 MED ORDER — DEXMEDETOMIDINE HCL IN NACL 200 MCG/50ML IV SOLN
0.1000 ug/kg/h | INTRAVENOUS | Status: DC
Start: 2021-11-25 — End: 2021-11-25
  Filled 2021-11-25: qty 50

## 2021-11-25 MED ORDER — ROCURONIUM BROMIDE 10 MG/ML IV SOLN (WRAP)
INTRAVENOUS | Status: DC | PRN
Start: 2021-11-25 — End: 2021-11-25
  Administered 2021-11-25: 100 mg via INTRAVENOUS
  Administered 2021-11-25: 50 mg via INTRAVENOUS

## 2021-11-25 MED ORDER — CEFUROXIME SODIUM 1.5 G IJ/IV SOLR (WRAP)
Status: DC | PRN
Start: 2021-11-25 — End: 2021-11-25
  Administered 2021-11-25 (×2): 1.5 g via INTRAVENOUS

## 2021-11-25 MED ORDER — NEOSTIGMINE METHYLSULFATE 1 MG/ML IJ/IV SOLN (WRAP)
Status: DC | PRN
Start: 2021-11-25 — End: 2021-11-25
  Administered 2021-11-25: 4 mg via INTRAVENOUS

## 2021-11-25 MED ORDER — POTASSIUM CHLORIDE CRYS ER 20 MEQ PO TBCR
0.0000 meq | EXTENDED_RELEASE_TABLET | ORAL | Status: DC | PRN
Start: 2021-11-25 — End: 2021-11-26

## 2021-11-25 MED ORDER — PHENYLEPHRINE 100 MCG/ML IV BOLUS (ANESTHESIA)
PREFILLED_SYRINGE | INTRAVENOUS | Status: DC | PRN
Start: 2021-11-25 — End: 2021-11-25
  Administered 2021-11-25 (×4): 100 ug via INTRAVENOUS

## 2021-11-25 MED ORDER — VANCOMYCIN HCL IN NACL 1.5-0.9 GM/500ML-% IV SOLN
1500.0000 mg | INTRAVENOUS | Status: AC
Start: 2021-11-25 — End: 2021-11-25
  Administered 2021-11-25: 1500 mg via INTRAVENOUS

## 2021-11-25 MED ORDER — LACTATED RINGERS IV SOLN
INTRAVENOUS | Status: DC | PRN
Start: 2021-11-25 — End: 2021-11-25

## 2021-11-25 MED ORDER — TRANEXAMIC ACID-NACL 1000-0.7 MG/100ML-% IV SOLN (NARRATOR USE ONLY)
INTRAVENOUS | Status: DC | PRN
Start: 2021-11-25 — End: 2021-11-25
  Administered 2021-11-25: 2 mg/kg/h via INTRAVENOUS

## 2021-11-25 MED ORDER — CALCIUM CHLORIDE 10 % IV SOLN
INTRAVENOUS | Status: DC | PRN
Start: 2021-11-25 — End: 2021-11-25
  Administered 2021-11-25: 500 mg via INTRAVENOUS
  Administered 2021-11-25: 200 mg via INTRAVENOUS

## 2021-11-25 MED ORDER — LISINOPRIL 10 MG PO TABS
10.0000 mg | ORAL_TABLET | Freq: Every day | ORAL | Status: DC
Start: 2021-11-25 — End: 2021-11-26
  Administered 2021-11-26: 10 mg via ORAL
  Filled 2021-11-25: qty 1

## 2021-11-25 MED ORDER — ALBUMIN HUMAN/BIOSIMILIAR 5% IV SOLN (WRAP)
INTRAVENOUS | Status: AC
Start: 2021-11-25 — End: ?
  Filled 2021-11-25: qty 250

## 2021-11-25 MED ORDER — SODIUM PHOSPHATES 3 MMOLE/ML IV SOLN (WRAP)
25.0000 mmol | INTRAVENOUS | Status: DC | PRN
Start: 2021-11-25 — End: 2021-11-26

## 2021-11-25 MED ORDER — SODIUM CHLORIDE 0.9 % IV SOLN
INTRAVENOUS | Status: DC | PRN
Start: 2021-11-25 — End: 2021-11-25
  Administered 2021-11-25 (×2): 5 [IU] via INTRAVENOUS

## 2021-11-25 MED ORDER — ACETAMINOPHEN 10 MG/ML IV SOLN
1000.0000 mg | Freq: Once | INTRAVENOUS | Status: AC
Start: 2021-11-25 — End: 2021-11-25
  Administered 2021-11-25: 1000 mg via INTRAVENOUS
  Filled 2021-11-25: qty 100

## 2021-11-25 MED ORDER — GABAPENTIN 300 MG PO CAPS
ORAL_CAPSULE | ORAL | Status: AC
Start: 2021-11-25 — End: 2021-11-25
  Administered 2021-11-25: 300 mg via ORAL
  Filled 2021-11-25: qty 1

## 2021-11-25 MED ORDER — DEXMEDETOMIDINE HCL IN NACL 400 MCG/100ML IV SOLN
0.1000 ug/kg/h | INTRAVENOUS | Status: DC
Start: 2021-11-25 — End: 2021-11-26
  Administered 2021-11-25: 0.7 ug/kg/h via INTRAVENOUS

## 2021-11-25 MED ORDER — ASPIRIN 81 MG PO TBEC
81.0000 mg | DELAYED_RELEASE_TABLET | Freq: Every day | ORAL | Status: DC
Start: 2021-11-26 — End: 2021-12-02
  Administered 2021-11-26 – 2021-12-02 (×7): 81 mg via ORAL
  Filled 2021-11-25 (×8): qty 1

## 2021-11-25 MED ORDER — ATORVASTATIN CALCIUM 80 MG PO TABS
80.0000 mg | ORAL_TABLET | Freq: Every evening | ORAL | Status: DC
Start: 2021-11-25 — End: 2021-12-02
  Administered 2021-11-25 – 2021-12-01 (×7): 80 mg via ORAL
  Filled 2021-11-25 (×7): qty 1

## 2021-11-25 MED ORDER — POLYETHYLENE GLYCOL 3350 17 G PO PACK
17.0000 g | PACK | Freq: Every day | ORAL | Status: DC
Start: 2021-11-25 — End: 2021-12-02
  Administered 2021-11-26 – 2021-11-30 (×5): 17 g via ORAL
  Filled 2021-11-25 (×5): qty 1

## 2021-11-25 MED ORDER — MAGNESIUM OXIDE 400 MG TABS (WRAP)
400.0000 mg | ORAL_TABLET | ORAL | Status: DC | PRN
Start: 2021-11-25 — End: 2021-11-26

## 2021-11-25 MED ORDER — CEFUROXIME SODIUM 1.5 G IJ/IV SOLR (WRAP)
Status: AC
Start: 2021-11-25 — End: ?
  Filled 2021-11-25: qty 1500

## 2021-11-25 MED ORDER — VEIN SOLUTION (OR ONLY) (FX)
Status: DC | PRN
Start: 2021-11-25 — End: 2021-11-25
  Administered 2021-11-25: 2

## 2021-11-25 MED ORDER — ALBUMIN HUMAN/BIOSIMILIAR 5% IV SOLN (WRAP)
INTRAVENOUS | Status: DC | PRN
Start: 2021-11-25 — End: 2021-11-25

## 2021-11-25 MED ORDER — DEXTROSE 1% - SODIUM CHLORIDE 0.9% IV SOLN
Status: DC | PRN
Start: 2021-11-25 — End: 2021-11-25
  Administered 2021-11-25: 250 mL via INTRAVENOUS

## 2021-11-25 MED ORDER — NITROGLYCERIN IN D5W 200-5 MCG/ML-% IV SOLN
10.0000 ug/min | INTRAVENOUS | Status: DC | PRN
Start: 2021-11-25 — End: 2021-11-26

## 2021-11-25 MED ORDER — LUBRIFRESH P.M. OP OINT
TOPICAL_OINTMENT | OPHTHALMIC | Status: AC
Start: 2021-11-25 — End: ?
  Filled 2021-11-25: qty 3.5

## 2021-11-25 MED ORDER — GLYCOPYRROLATE 0.2 MG/ML IJ SOLN (WRAP)
INTRAMUSCULAR | Status: DC | PRN
Start: 2021-11-25 — End: 2021-11-25
  Administered 2021-11-25: .6 mg via INTRAVENOUS

## 2021-11-25 MED ORDER — SODIUM PHOSPHATES 3 MMOLE/ML IV SOLN (WRAP)
35.0000 mmol | INTRAVENOUS | Status: DC | PRN
Start: 2021-11-25 — End: 2021-11-26

## 2021-11-25 MED ORDER — ROPIVACAINE HCL 5 MG/ML IJ SOLN
INTRAMUSCULAR | Status: AC
Start: 2021-11-25 — End: ?
  Filled 2021-11-25: qty 30

## 2021-11-25 MED ORDER — ACETAMINOPHEN 500 MG PO TABS
1000.0000 mg | ORAL_TABLET | ORAL | Status: AC
Start: 2021-11-25 — End: 2021-11-25

## 2021-11-25 MED ORDER — SENNOSIDES-DOCUSATE SODIUM 8.6-50 MG PO TABS
2.0000 | ORAL_TABLET | Freq: Two times a day (BID) | ORAL | Status: DC
Start: 2021-11-25 — End: 2021-12-02
  Administered 2021-11-25 – 2021-11-30 (×9): 2 via ORAL
  Filled 2021-11-25 (×11): qty 2

## 2021-11-25 MED ORDER — PROPOFOL 10 MG/ML IV EMUL (WRAP)
INTRAVENOUS | Status: DC | PRN
Start: 2021-11-25 — End: 2021-11-25
  Administered 2021-11-25: 70 mg via INTRAVENOUS
  Administered 2021-11-25: 30 mg via INTRAVENOUS
  Administered 2021-11-25: 50 mg via INTRAVENOUS

## 2021-11-25 MED ORDER — DEXMEDETOMIDINE HCL IN NACL 200 MCG/50ML IV SOLN
INTRAVENOUS | Status: DC | PRN
Start: 2021-11-25 — End: 2021-11-25
  Administered 2021-11-25: .5 ug/kg/h via INTRAVENOUS

## 2021-11-25 MED ORDER — EPHEDRINE SULFATE 50 MG/ML IJ/IV SOLN (WRAP)
Status: AC
Start: 2021-11-25 — End: ?
  Filled 2021-11-25: qty 1

## 2021-11-25 MED ORDER — PROPOFOL 10 MG/ML IV EMUL (WRAP)
INTRAVENOUS | Status: AC
Start: 2021-11-25 — End: ?
  Filled 2021-11-25: qty 20

## 2021-11-25 MED ORDER — METOPROLOL TARTRATE 25 MG PO TABS
25.0000 mg | ORAL_TABLET | Freq: Every day | ORAL | Status: DC
Start: 2021-11-26 — End: 2021-11-28
  Administered 2021-11-26 – 2021-11-28 (×3): 25 mg via ORAL
  Filled 2021-11-25 (×3): qty 1

## 2021-11-25 MED ORDER — TRANEXAMIC ACID 2000MG/100 ML ADULT BOLUS
Status: DC | PRN
Start: 2021-11-25 — End: 2021-11-25
  Administered 2021-11-25: 885 mg via INTRAVENOUS

## 2021-11-25 MED ORDER — GABAPENTIN 300 MG PO CAPS
300.0000 mg | ORAL_CAPSULE | ORAL | Status: AC
Start: 2021-11-25 — End: 2021-11-25

## 2021-11-25 MED ORDER — SODIUM CHLORIDE 0.9 % IV MBP
1.5000 g | Freq: Three times a day (TID) | INTRAVENOUS | Status: AC
Start: 2021-11-25 — End: 2021-11-26
  Administered 2021-11-25 – 2021-11-26 (×3): 1.5 g via INTRAVENOUS
  Filled 2021-11-25 (×3): qty 1500

## 2021-11-25 MED ORDER — ACETAMINOPHEN 500 MG PO TABS
ORAL_TABLET | ORAL | Status: AC
Start: 2021-11-25 — End: 2021-11-25
  Administered 2021-11-25: 1000 mg via ORAL
  Filled 2021-11-25: qty 2

## 2021-11-25 MED ORDER — BISACODYL 10 MG RE SUPP
10.0000 mg | Freq: Every day | RECTAL | Status: DC | PRN
Start: 2021-11-25 — End: 2021-12-02

## 2021-11-25 MED ORDER — TRAMADOL HCL 50 MG PO TABS
50.0000 mg | ORAL_TABLET | ORAL | Status: DC | PRN
Start: 2021-11-25 — End: 2021-11-26
  Administered 2021-11-25 – 2021-11-26 (×3): 50 mg via ORAL
  Filled 2021-11-25 (×3): qty 1

## 2021-11-25 MED ORDER — LIDOCAINE HCL (PF) 2 % IJ SOLN
INTRAMUSCULAR | Status: AC
Start: 2021-11-25 — End: ?
  Filled 2021-11-25: qty 5

## 2021-11-25 MED ORDER — CHLORHEXIDINE GLUCONATE 0.12 % MT SOLN
OROMUCOSAL | Status: AC
Start: 2021-11-25 — End: 2021-11-25
  Administered 2021-11-25: 15 mL via OROMUCOSAL
  Filled 2021-11-25: qty 15

## 2021-11-25 MED ORDER — SODIUM CHLORIDE 0.9 % IV SOLN
INTRAVENOUS | Status: DC | PRN
Start: 2021-11-25 — End: 2021-11-25
  Administered 2021-11-25: 2 [IU]/h via INTRAVENOUS

## 2021-11-25 MED ORDER — SODIUM CHLORIDE 0.9 % IV MBP
INTRAVENOUS | Status: DC | PRN
Start: 2021-11-25 — End: 2021-11-25
  Administered 2021-11-25: 10 mg/h via INTRAVENOUS

## 2021-11-25 MED ORDER — NICARDIPINE IV BOLUS SYRINGE (ANESTHESIA)
INTRAVENOUS | Status: DC | PRN
Start: 2021-11-25 — End: 2021-11-25
  Administered 2021-11-25: .2 mg via INTRAVENOUS
  Administered 2021-11-25: .3 mg via INTRAVENOUS

## 2021-11-25 MED ORDER — ONDANSETRON HCL 4 MG/2ML IJ SOLN
4.0000 mg | Freq: Three times a day (TID) | INTRAMUSCULAR | Status: DC | PRN
Start: 2021-11-25 — End: 2021-12-02

## 2021-11-25 MED FILL — Cardioplegic Soln: Qty: 125 | Status: AC

## 2021-11-25 MED FILL — Sodium Bicarbonate IV Soln 8.4%: INTRAVENOUS | Qty: 100 | Status: AC

## 2021-11-25 MED FILL — Magnesium Sulfate Inj 50%: INTRAMUSCULAR | Qty: 5000 | Status: AC

## 2021-11-25 MED FILL — Potassium Chloride Inj 2 mEq/ML: INTRAVENOUS | Qty: 80 | Status: AC

## 2021-11-25 MED FILL — Heparin Sodium (Porcine) Inj 1000 Unit/ML: INTRAMUSCULAR | Qty: 50 | Status: AC

## 2021-11-25 MED FILL — Calcium Chloride Inj 10%: INTRAVENOUS | Qty: 10 | Status: AC

## 2021-11-25 MED FILL — Lidocaine HCl Local Inj 1%: INTRAMUSCULAR | Qty: 20 | Status: AC

## 2021-11-25 MED FILL — Mannitol IV Soln 20%: INTRAVENOUS | Qty: 250 | Status: AC

## 2021-11-25 MED FILL — Bacteriostatic Sodium Chloride Inj Soln 0.9%: INTRAMUSCULAR | Qty: 2 | Status: AC

## 2021-11-25 MED FILL — Phenylephrine HCl IV Soln 10 MG/ML: INTRAVENOUS | Qty: 2 | Status: AC

## 2021-11-25 SURGICAL SUPPLY — 154 items
ADHESIVE DERMABOND PRINEO 42CM (Skin Closure) ×1
ADHESIVE SKIN CLOSURE DERMABOND MINI .36 (Suture) ×6
ADHESIVE SKIN CLOSURE DERMABOND MINI .36 ML LIQUID APPLICATOR (Suture) ×6 IMPLANT
ADHESIVE SKIN CLOSURE DERMABOND PRINEO MESH TAPE EASY REMOVE L42 CM (Skin Closure) ×3 IMPLANT
APPLICATOR CHLORAPREP 26 ML 70% ISOPROPYL ALCOHOL 2% CHLORHEXIDINE (Applicator) ×33 IMPLANT
APPLICATOR PRP 70% ISPRP 2% CHG 26ML (Applicator) ×33
APPLIER INTERNAL CLIP SMALL L9.3 IN 20 (Clips) ×12
APPLIER INTERNAL CLIP SMALL L9.3 IN 20 MULTIPLE OPEN LIGATE LIGACLIP (Clips) ×12 IMPLANT
BANDAGE COBAN STERILE 4IN LF (Bandage) ×2
BLADE BOVIE REG EXT (Cautery) ×1
BLADE MINI ES SHP (Blade) ×1
BLADE OPHTHALMIC BLUE MINI-BLADE STRGHT GRINDLESS SHARP ALL ARND 2 BVL (Blade) ×3 IMPLANT
BLADE OPTH BLU BVR MNI BLDE 180D 0.710IN (Blade) ×1
BLADE OPTH STRG MN BLD GRINDLESS SHRP (Blade) ×3 IMPLANT
BLADE OPTH STRG MN BLD GRNDLSS SHRP BLUE (Blade) ×3
BLADE SURGICAL CLIPPER WIDE W37.2 MM (Blade) ×3
BLADE SURGICAL CLIPPER WIDE W37.2 MM GENERAL PURPOSE EXIST HANDLE DROP (Blade) ×3 IMPLANT
BLADE SURGICAL DISPOSABLE (Blade) ×1
CABLE CUTTING (Cable) ×4
CABLE SYSTEM NEEDLE CUTTING EDGE STERNAL SONGER 1.1MM  400689 (Cable) ×3 IMPLANT
CATH URETHRAL REDRUBBER 18F (Catheter Urine) ×3
CATHETER URETHRAL OD18 FR 2 LARGE OPPOSE (Catheter Urine) ×9
CATHETER URETHRAL OD18 FR 2 LARGE OPPOSE EYE FUNNEL END SMOOTH TIP (Catheter Urine) ×9 IMPLANT
CHLORAPREP APLCTR ORANGE 26ML (Applicator) ×11
CLIP INTERNAL MEDIUM CHEVRON 24 (Clips) ×3 IMPLANT
CLIP LIGATING MEDIUM (Clips) ×1
CLIP SURGICAL L6 MM ATRAUMATIC OCCLUSION (Clips) ×9
CLIP SURGICAL L6 MM ATRAUMATIC OCCLUSION 3/4 FORCE SPRING SOFT (Clips) ×9 IMPLANT
CLIP SURGICAL SPRING SOFT LIGH (Clips) ×3
CONNECTOR PERFUSION 3/8IN Y TMP STERILE (Connector) ×3 IMPLANT
CONNECTOR PERFUSION EQUAL .25IN Y TMP (Connector) ×9 IMPLANT
CONNECTOR PERFUSION REDUCE 3/8IN Y STERILE (Connector) ×3 IMPLANT
CONNECTOR PERFUSION REDUCING .25IN 3/8IN STRAIGHT TMP STERILE (Connector) ×3 IMPLANT
CONNECTOR PRFSN STRG TMP .25IN 3/8IN (Connector) ×3
CONNECTOR PRFSN Y 3/8IN STRL RDC (Connector) ×3
CONNECTOR PRFSN Y TMP .25IN EQL (Connector) ×9
CONNECTOR PRFSN Y TMP 3/8IN STRL (Connector) ×3
CONNECTOR REDUCE 1/4X1/4X3/8IN (Connector) ×1
CONNECTOR REDUCER STRT 1/4X3/8 (Connector) ×1
CONNECTOR Y .25X.25X.25IN (Connector) ×3
CONNECTOR Y 3/8X3/8X3/8IN (Connector) ×1
CUFF TOURNIQUET CYLINDRICAL L18 IN X W4 IN 2 PORT BLADDER QUICK (Procedure Accessories) ×3 IMPLANT
CUFF TRNQT CYL CLR CUF 18X4IN LF STRL 2 (Procedure Accessories) ×3
DERMABOND MINI (Suture) ×2
DERMABOND PRINEO SKIN CLOSURE SYSTEM (Skin Closure) ×3
DRAIN CHANL FLUTE FULL 24FR (Drain) ×2
DRAIN INCS PLS OAS LF STRL 2 CHMBR DRY (Procedure Accessories) ×6
DRAIN OD24 FR RADIOPAQUE 4 FREE FLOW (Drain) ×6
DRAIN OD24 FR RADIOPAQUE 4 FREE FLOW CHANNEL FULL FLUTE BLAKE L5/16 IN (Drain) ×6 IMPLANT
DRAIN PLEUR EVAC DOUBLE (Procedure Accessories) ×2
DRAIN THORACIC 2 CHAMBER DRY SUCTION 2 PATIENT TUBE OASIS INCISION (Procedure Accessories) ×6 IMPLANT
DRAPE ORTHOPEDIC U SHAPE ABSORBENT ADHESIVE STRIP 15X26IN 89731 (Drape) ×3 IMPLANT
DRESSING FM SIL OPTFM 7X7IN LF STRL ADH (Dressing) ×3 IMPLANT
DRESSING KERLIX AMD ROLL 4.1YD (Dressing) ×2
DRESSING PRIMAPORE 20X10CM (Dressing) ×2
DRESSING PRIMAPORE 30X10CM (Dressing) ×1
DRESSING PRIMAPORE 4X3/18 (Dressing) ×4
DRESSING SILICONE 7X7IN (Dressing) ×1
DRESSING WND ACRL PLSTR PRMPR 11.75X4IN (Dressing) ×3 IMPLANT
DRESSING WND ACRL PLSTR PRMPR 4INX3 1/8 (Dressing) ×12 IMPLANT
DRESSING WND ACRL PLSTR PRMPR 8X4IN LF (Dressing) ×6 IMPLANT
DRESSING WOUND KERLIX L4.125 YD X W4.5 (Dressing) ×6
DRESSING WOUND KERLIX L4.125 YD X W4.5 IN 6 PLY ANTIMICROBIAL (Dressing) ×6 IMPLANT
ELECTRODE ADULT PATIENT RETURN L9 FT REM POLYHESIVE ACRYLIC FOAM (Procedure Accessories) ×9 IMPLANT
ELECTRODE DEFIB QIK COMBO ADLT (Procedure Accessories) ×1
ELECTRODE DEFIBRILLATOR ADULT L24 IN (Procedure Accessories) ×3 IMPLANT
ELECTRODE ELECTROSURGICAL BLADE L6.5 IN (Cautery) ×3
ELECTRODE ELECTROSURGICAL BLADE L6.5 IN EDGE (Cautery) ×3 IMPLANT
ELECTRODE PATIENT RETURN L9 FT VALLEYLAB (Procedure Accessories) ×9
GLOVE 8.0 ULTRA TOUCH (Glove) ×2
GLOVE SURGICAL 8 BIOGEL PI ULTRATOUCH (Glove) ×6
GLOVE SURGICAL 8 BIOGEL PI ULTRATOUCH POWDER FREE ROUGH BEAD CUFF (Glove) ×6 IMPLANT
GOWN SMART XLG (Gown) ×2
GOWN SURGICAL XL SMARTGOWN LEVEL 4 (Gown) ×6
GOWN SURGICAL XL SMARTGOWN LEVEL 4 BREATHABLE (Gown) ×6 IMPLANT
INSERT NEEDLE HOLDER SUTURE RETENTION (Retractor) ×3
INSERT NEEDLE HOLDER SUTURE RETENTION SLOT OCTOBASE RETRACTOR (Retractor) ×3 IMPLANT
INSERT RETRACTOR (Retractor) ×1
KIT HEMOPRO 2 VESSEL HARVSTG (Kits) ×2
KIT HMST GELTN MTRX THRMB 10ML 13CM FLSL (Hemostat) ×3 IMPLANT
KIT ROOM TURNOVER (Kits) ×4
KIT ROOM TURNOVER ~~LOC~~ INVA02 (Kits) ×3 IMPLANT
KIT SURGICAL BASIN CVOR (Other) ×3
KIT SURGICAL BASIN CVOR MEDLINE INDUSTRIES, INC. (Other) ×3 IMPLANT
KIT TISS CLSR 10ML DPJT TSSL LF STRL (Hemostat) ×3
KIT TISSUE CLOSURE FREEZE DRIED NONPYROGENIC DUPLOJECT TISSEEL 10 ML (Hemostat) ×3 IMPLANT
LIGACLIP SML APPLIER 9 3/8 (Clips) ×4
MRKR SKN W RULER AND LABELS (Positioning Supplies) ×1
PACK SRG LF STRL RADL ART DISP (Pack) ×3
PAD ELECTROSRG GRND REM W CRD (Procedure Accessories) ×3
PEN SURGICAL MARKING MEDLINE SKIN RULER (Positioning Supplies) ×3
PEN SURGICAL MARKING SKIN RULER LARGE RESERVOIR REGULAR TIP LABEL (Positioning Supplies) ×3 IMPLANT
PUNCH AORTIC 4.5MM (Procedure Accessories) ×1
PUNCH AORTIC OD4.5 MM 2 CUT ROTATE (Procedure Accessories) ×3
PUNCH AORTIC OD4.5 MM 2 CUT ROTATE SYRINGE STYLE HOLLOW LOW PROFILE (Procedure Accessories) ×3 IMPLANT
RADIAL ARTERY PACK (Pack) ×1
SEALANT FLOSEAL FAST PREP 10ML (Hemostat) ×1
SEALANT TISSEEL FIBRIN 10ML (Hemostat) ×1
SET BASIN CVOR DISP (Other) ×1
SOL NACL.9% 1000ML IRR NONLTX (Irrigation Solutions) ×4
SOL WATER STERILE 1000CC BTLE (Irrigation Solutions) ×2
SOLN IRRISEPT WOUND DEBRIDEMNT (Solution) ×1
SOLUTION IRRIGATION 0.9% SODIUM CHLORIDE (Irrigation Solutions) ×12
SOLUTION IRRIGATION 0.9% SODIUM CHLORIDE 1000 ML PLASTIC POUR BOTTLE (Irrigation Solutions) ×12 IMPLANT
SPNG ABSORBABLE GELATIN (Hemostat) ×1
SPONGE ABSORBABLE L12.5 CM X W8 CM (Hemostat) ×3
SPONGE ABSORBABLE L12.5 CM X W8 CM PORCINE GELATIN SURGIFOAM THK10 MM (Hemostat) ×3 IMPLANT
SPONGE LAPAROTOMY 18X18IN (Sponge) ×1
SPONGE LAPAROTOMY L18 IN X W18 IN 4 PLY (Sponge) ×3
SPONGE LAPAROTOMY L18 IN X W18 IN 4 PLY RADIOPAQUE PYRONEMA FREE HIGH (Sponge) ×3 IMPLANT
STRAP STRCHR SONTARA PLSTR 60X3IN LF NS (Procedure Accessories) ×3
STRAP STRETCHER L60 IN X W3 IN OR TABLE HOOK SONTARA POLYESTER WHITE (Procedure Accessories) ×3 IMPLANT
STRAP STRETCHER/TABLE 3X60IN (Procedure Accessories) ×1
SUTURE PROLENE 4-0 SH 36IN (Suture) ×8
SUTURE PROLENE 6-0 BV1 30IN (Suture) ×2
SUTURE PROLENE 6-0 RB2 30IN (Suture) ×4
SUTURE PROLENE 7-0 BV1 24IN (Suture) ×3
SUTURE PROLENE 7-0 BV175-6 (Suture) ×3
SUTURE PROLENE BLUE 4-0 SH L36 IN 2 ARM (Suture) ×24
SUTURE PROLENE BLUE 4-0 SH L36 IN 2 ARM MONOFILAMENT NONABSORBABLE (Suture) ×24 IMPLANT
SUTURE PROLENE BLUE 6-0 BV-1 L30 IN 2 (Suture) ×6
SUTURE PROLENE BLUE 6-0 BV-1 L30 IN 2 ARM MONOFILAMENT NONABSORBABLE (Suture) ×6 IMPLANT
SUTURE PROLENE BLUE 6-0 RB-2 L30 IN 2 (Suture) ×12
SUTURE PROLENE BLUE 6-0 RB-2 L30 IN 2 ARM MONOFILAMENT NONABSORBABLE (Suture) ×12 IMPLANT
SUTURE PROLENE BLUE 7-0 BV-1 L24 IN 2 (Suture) ×9
SUTURE PROLENE BLUE 7-0 BV-1 L24 IN 2 ARM MONOFILAMENT NONABSORBABLE (Suture) ×9 IMPLANT
SUTURE PROLENE BLUE 7-0 BV175-6 L24 IN 2 (Suture) ×9
SUTURE PROLENE BLUE 7-0 BV175-6 L24 IN 2 ARM MONOFILAMENT (Suture) ×9 IMPLANT
SUTURE SILK 2.0 FSL 30IN (Suture) ×5
SUTURE SILK PERMA HAND BLACK 2-0 FSL L30 (Suture) ×15
SUTURE SILK PERMA HAND BLACK 2-0 FSL L30 IN BRAID NONABSORBABLE (Suture) ×15 IMPLANT
SYSTEM CONE ENDOSCOPE HANDPIECE CANNULA (Kits) ×6
SYSTEM CONE ENDOSCOPE HANDPIECE CANNULA SEAL VASOVIEW HEMOPRO 2 RADIUS (Kits) ×6 IMPLANT
SYSTEM WOUND IRRIGATION DEBRIDEMENT (Solution) ×3
SYSTEM WOUND IRRIGATION DEBRIDEMENT CLEANSING IRRISEPT 0.05% (Solution) ×3 IMPLANT
TOURNIQUET 18X4 RED (Procedure Accessories) ×1
TOWEL L27 IN X W17 IN COTTON PREWASH (Other) ×3
TOWEL L27 IN X W17 IN COTTON PREWASH DELINT HIGH ABSORBENT BLUE (Other) ×3 IMPLANT
TOWEL N-ABSORB UTIL STRL 17X26 (Drape) ×4
TOWEL OR DISP 10PK (Other) ×1
TRAY 1LYR 14FR 10ML 100% SIL UM SAFESECURE (Tray) ×1
TRAY CABG (Pack) ×1
TRAY CATHETER 1 LAYER FOLEY URINE METER CLOSED SYSTEM PVP SILICONE (Tray) ×3 IMPLANT
TRAY CATHETER MEDLINE 1 LAYER FOLEY (Tray) ×3
TRAY SRG CABG (Pack) ×3
TRAY SURGICAL CABG (Pack) ×3 IMPLANT
TRAY SURGICAL RADIAL ARTERY (Pack) ×3 IMPLANT
WATER STERILE PLASTIC POUR BOTTLE 1000 (Irrigation Solutions) ×6
WATER STERILE PLASTIC POUR BOTTLE 1000 ML (Irrigation Solutions) ×6 IMPLANT
WIPE PERSONAL L7.9 IN X W7.9 IN POST INSERTION FOLEY SURESTEP PULP (Patient Supply) ×6 IMPLANT
WIPE PROVON SURESTEP FOLEY (Patient Supply) ×2
WIPE PRSNL PULP PP SRSTP 7.9X7.9IN LF NS (Patient Supply) ×6
WRAP COMPRESSION L5 YD X W4 IN SELF (Bandage) ×6
WRAP COMPRESSION L5 YD X W4 IN SELF ADHERENT ELASTIC LIGHTWEIGHT HAND (Bandage) ×6 IMPLANT

## 2021-11-25 NOTE — Op Note (Signed)
Procedure Date: 11/25/2021    Patient Type: I    SURGEON: Austin Miles, MD    PREOPERATIVE DIAGNOSES:  1.  Coronary artery disease.  2.  Unstable angina.    POSTOPERATIVE DIAGNOSES:  1.  Coronary artery disease.  2.  Unstable angina.    PROCEDURE PERFORMED:  1.  Urgent coronary artery bypass graft x4 (LIMA to distal LAD, saphenous   vein graft to PDA, RIMA to OM1, radial to OM2).  2.  Left lower extremity endoscopic vein harvest.  3.  Left upper extremity endoscopic radial harvest.    Cardiopulmonary bypass time 1 hour and 45 minutes.  Crossclamp time 1 hour   and 29 minutes.    COMPLICATIONS:  Nil.    DISPOSITION:  [facility].    CONDITION:  Critical, but stable.    ANESTHESIA:  General.    ESTIMATED BLOOD LOSS:  75 mL    DRAINS:  Four 24-French Blakes, left chest, right chest, other 2 mediastinal.    SPECIMENS:  Nil.    FINDINGS:  Conduit is good.  OM1, LAD and OM2 small and thin vessels at 1.25 mm.  PDA   1.5 mm.  Excellent Medistim flows in bypasses.    Notes, all counts correct at the end of the case.  There were no immediate   postoperative complications and I was present throughout the entire   procedure.    INDICATIONS:  This 55 year old gentleman with known coronary artery disease and previous   PCI presented with unstable angina and shortness of breath.  He was found   to have severe multivessel coronary artery disease.  I had a lengthy   conversation with the patient and his family about alternatives,   indications, risks, benefits of the operation.  They had all the questions   answered and agreed to proceed.    DESCRIPTION OF PROCEDURE:  After full informed consent was obtained, the patient was brought to the   operating room where he underwent general anesthesia with endotracheal tube  intubation.  Standard lines were placed by the anesthesia team.  He was   then positioned, prepped and draped in the usual fashion.  Left lower   extremity and the cephalic vein harvest was performed.  The leg was  wrapped  at the end of the procedure.  Left upper extremity endoscopic radial   harvest was performed.  The forearm was then wrapped and tucked.  Midline   sternotomy was performed.  LIMA was taken down as a pedicle from the origin  to the bifurcation.  LIMA was taken down skeletonized from the origin to   the bifurcation.  The patient was fully heparinized and both mammary   arteries were divided distally, but there was excellent pulsatile blood   flow.    Pericardial well was made in a standard fashion.  Cannulas were then   placed, starting with the aortic cannula, dual-stage venous cannula via the  right atrial appendage, retrograde coronary sinus cannula and finally the   antegrade/root vent cannula was placed.  When the ACT was appropriate, the   patient underwent full cardiopulmonary bypass.  Crossclamp was placed and 1  liter of cold antegrade blood cardioplegia was given.  There was good   diastolic arrest with electrochemical silence.  Topical cooling was also   used and this was followed by 1 liter of cold retrograde blood   cardioplegia.  Throughout the procedure at about 15-minute intervals, cold   retrograde blood cardioplegia was given.  Inferior wall was inspected.  The  PDA was identified as suitable target.  An arteriotomy was performed.  He   had an end-to-side vein graft so coronary artery anastomosis was performed   which tested well with a hand flush and sized to length.  Next, the OM2 was  opened.  It was a rather small thin vessel.  An end-to-side radial to   coronary artery anastomosis was done, which tested well with the hand flush  and sized to length.  Next, a skeletonized RIMA was brought behind the   aorta and pulmonary artery to the left side of the heart.  The OM1 was   opened and an end-to-side RIMA to coronary artery anastomosis was done with  running 7-0 polypropylene suture in a circumferential manner.  This had   excellent Medistim flows.  Finally the distal LAD was opened in an    end-to-side LIMA to LAD anastomosis was done with running 7-0 polypropylene  suture in a circumferential manner.  This also had excellent Medistim   flows.    Both proximal aortocoronary anastomosis was done consecutively in   end-to-side fashion and marked with metal clips.  The patient was placed in  steep Trendelenburg position.  Warm retrograde followed by warm antegrade   blood cardioplegia was given.  Lidocaine and magnesium was given and the   crossclamp removed.  The patient was in spontaneous normal sinus rhythm.    A, V wires and subsequently drains were placed.  The patient was gradually   brought off cardiopulmonary bypass, which was uneventful.  Protamine was   administered, and he was decannulated successfully.  When I was satisfied   with hemostasis, I proceeded with closure, which was done with the Pioneer   cable system in multiple layers.    I was assisted by PA Geanie Logan and PA Georgena Spurling for conduit harvest,   operative exposure and incision closure.  All counts correct at end of   case.  There were no immediate postoperative complications and I was   present through entire procedure.      D: 11/25/2021 06:04 PM by Austin Miles, MD ([id number])  T: 11/25/2021 06:40 PM by [id number] (Conf: [id number]) (Doc ID: [id number])

## 2021-11-25 NOTE — Plan of Care (Signed)
Patient: Raymond Cook, Raymond Cook   Date: 11/25/2021  LOS: 2  Attending: Austin Miles, MD  APP: Felecia Jan, NP    Assumed care of patient at: 1930    Review of Systems    Neuro/Activity: Intact, FC, MAE, x2 assist OOB to chair, Oxycodone 5mg  Q6 PRN added for pain management    CV: A-paced postop, now SR in the 60s-70s, off any cardiac gtts, 1L of LR given    Respiratory: Extubated to 6L, now on 4L NC, 4 CT to SX w/ intermittent air leak- tidaling, Jill Side NP aware    GI: Advanced to clear liquid, S/R/NT    GU: Foley, see flowsheet for I&O    Skin: Surgical incisions otherwise intact    ID: [id number]    Dispo/Plan:   -Transfer to [facility]    Problem: Post-op Phase - Cardiac Surgery  Goal: Effective breathing pattern is maintained  11/25/2021 2303 by Cathie Beams, RN  Outcome: Progressing  Flowsheets (Taken 11/25/2021 2303)  Effective breathing pattern is maintained:   Position patient for maximum ventilatory efficiency with HOB to a minimum of 30 degrees when hemodynamically stable   Maintain SpO2 level per LIP order   Monitor for sleep apnea   Monitor for medication-induced respiratory depression  11/25/2021 2303 by Cathie Beams, RN  Outcome: Progressing  Goal: Cardiac output is adequate  11/25/2021 2303 by Cathie Beams, RN  Outcome: Progressing  Flowsheets (Taken 11/25/2021 2303)  Cardiac output is adequate:   Monitor/assess vital signs, hemodynamic parameters, and temperature per LIP orders   Monitor/assess I&O including chest tube every hour for first 24 hours, then every 4 hours when telemetry/step-down status   Monitor/assess cardiac output/index per LIP order   Monitor/assess neurovascular status (i.e. pulses, capillary refill, pain, paresthesia, presence of edema)   Maintain temperature within ordered parameters   Keep pacer wires connected to pacemaker for at least 8-12 hours. When rhythm is stable without pacing, ground epicardial wires.  11/25/2021 2303 by Cathie Beams, RN  Outcome: Progressing

## 2021-11-25 NOTE — Progress Notes (Signed)
[facility] PREOP EVALUATION    LABS      Recent Labs     11/24/21  0430   WBC 6.88   Hgb 15.5   Hematocrit 44.5   Platelets 197       Recent Labs     11/24/21  0430   Sodium 135   Potassium 4.0   Chloride 104   CO2 21   BUN 14.0   Glucose 99     Recent Labs     11/24/21  0430   Creatinine 1.1       Recent Labs     11/24/21  2008   PT 13.7*   PT INR 1.2*   PTT 47*         Type and Cross -  done    UA -  negative    EKG -  NORMAL SINUS RHYTHM  LEFT AXIS DEVIATION  ABNORMAL ECG  WHEN COMPARED WITH ECG OF 23-Nov-2021 10:01, (UNCONFIRMED)  NO SIGNIFICANT CHANGE WAS FOUND    CXR -  clear lungs without acute cardiopulmonary pathology    Carotids -  FINDINGS: The extracranial carotid arteries and proximal vertebral   arteries were evaluated with high resolution gray scale imaging, color   Doppler, and spectral waveform analysis.  Degree of stenosis is derived   from velocity criteria which are extrapolated from diameter data as   defined by the Society of Radiologists in Ultrasound Consensus   Conference.  Findings are as follows:     RIGHT CAROTID:   Imaging demonstrates mild intimal thickening along the CCA and bulb.   There is mild irregular heterogeneous plaque in the right carotid   bulb/proximal ICA, resulting in a less than 50% stenosis by gray scale   imaging and velocity criteria. ICA to CCA ratio is 0.9.     LEFT CAROTID:   Imaging demonstrates mild intimal thickening along the CCA and bulb.   There is moderate irregular calcified plaque in the left carotid   bulb/proximal ICA, resulting in a less than 50% stenosis by gray scale   imaging and velocity criteria. ICA to CCA ratio is 0.7.     PROXIMAL VERTEBRAL ARTERIES: Patent. Antegrade flow.     Bedside spirometry -  PENDING    Echocardiogram - Summary    * The left ventricle is normal in size (LVEDD: 4.9 cm, LVESD: 3.0 cm).    * Left ventricular systolic function is normal with an ejection fraction by  Biplane Method of Discs of  60 %.    * Normal right  ventricular systolic function.    * Atria are normal in size.    * No significant valvular abnormalities.    * Compared to the prior study on 01/19/2017, no major changes are noted.        Findings  Left Ventricle    The left ventricle is normal in size (LVEDD: 4.9 cm, LVESD: 3.0 cm). There  is concentric left ventricular remodeling. Left ventricular systolic function  is normal with an ejection fraction by Biplane Method of Discs of  60 %. Left  ventricular segmental wall motion is normal. Left ventricular diastolic  filling parameters demonstrate normal diastolic function.  Right Ventricle    The right ventricular cavity size is normal in size. Normal right  ventricular systolic function. TAPSE = 2.4 cm, S' = 12 cm/s, fractional area  change = 40 %.        Left Atrium    The left atrium  is normal in size.     Right Atrium    The right atrium is normal in size.     Atrial Septum    No evidence of interatrial shunt by color Doppler.        Aortic Valve    The aortic valve is tricuspid. There is no aortic stenosis. There is no  aortic regurgitation.     Pulmonary Valve    The pulmonic valve is structurally normal. There is no pulmonic  regurgitation.     Mitral Valve    The mitral valve is structurally normal. There is trace mitral  regurgitation.     Tricuspid Valve    The tricuspid valve is structurally normal. There is trace tricuspid  regurgitation. No pulmonary hypertension with estimated right ventricular  systolic pressure of 24 mmHg.        Pericardium / Pleural Effusion    No pericardial effusion visualized.     Inferior Vena Cava    The IVC is normal in size with > 50% respiratory variance consistent with  normal RA pressure of 3 mmHg.     Aorta    The aortic root is normal in size. The ascending aorta is normal in size.        Measurements  2D Measurements  ----------------------------------------------------------------------  Name                                 Value         Normal  ----------------------------------------------------------------------     Parasternal 2D  ----------------------------------------------------------------------   IVS Diastolic Thickness  (2D)                               1.00 cm     0.60-1.00   LVID Diastole (2D)                 4.90 cm     4.20-5.80    LVIW Diastolic Thickness  (2D)                               1.10 cm     0.60-1.05   LVID Systole (2D)                  3.00 cm     2.50-4.00   LVOT Diameter                      2.10 cm                 LA Dimension (2D)                  3.40 cm     3.00-4.10   Ao Root Diameter (2D)              3.30 cm     2.70-3.70   Prox Asc Ao Diameter               3.30 cm     2.60-3.40      LV Ejection Fraction 2D  ----------------------------------------------------------------------  LV EF (BP MOD)                        60 %         52-72  Apical 2D Dimensions  ----------------------------------------------------------------------   RV Basal Diastolic  Dimension                          3.60 cm     2.50-4.10   LA Volume Index (BP A-L)        20.37 ml/m2       <=34.00   RA Area (4C)                     14.10 cm2       <=18.00  M-mode Measurements  ----------------------------------------------------------------------  Name                                 Value        Normal  ----------------------------------------------------------------------     M-Mode  ----------------------------------------------------------------------  TAPSE                              2.36 cm        >=1.60  LVOT/Aortic Valve Doppler Measurements  ----------------------------------------------------------------------  Name                                 Value        Normal  ----------------------------------------------------------------------     LVOT Doppler  ----------------------------------------------------------------------  LVOT Peak Velocity                1.01 m/s                    AoV  Doppler  ----------------------------------------------------------------------  AV Peak Velocity                  1.16 m/s                 AV Peak Gradient                    5 mmHg                 AV Mean Gradient                    3 mmHg           <20   AV Area (Cont Eq VTI)             2.87 cm2        >=3.00   AV V1/V2 Ratio                        0.87  RVOT/Pulmonic Valve Doppler Measurements  ----------------------------------------------------------------------  Name                                 Value        Normal  ----------------------------------------------------------------------     PV Doppler  ----------------------------------------------------------------------  PV Peak Velocity                  0.98 m/s  Mitral Valve Measurements  ----------------------------------------------------------------------  Name                                 Value  Normal  ----------------------------------------------------------------------     MV Doppler  ----------------------------------------------------------------------  MV E Peak Velocity                0.74 m/s                 MV A Peak Velocity                0.54 m/s                 MV E/A                                1.36                    MV Annular TDI  ----------------------------------------------------------------------  MV Septal e' Velocity             0.09 m/s        >=0.08   MV E/e' (Septal)                      8.01        <=8.00   MV Lateral e' Velocity            0.14 m/s        >=0.10   MV E/e' (Lateral)                     5.33        <=8.00  Tricuspid Valve Measurements  ----------------------------------------------------------------------  Name                                 Value        Normal  ----------------------------------------------------------------------     TV Regurgitation Doppler  ----------------------------------------------------------------------  RA Pressure                         3 mmHg            <=3  Aorta / Venous Measurements  ----------------------------------------------------------------------  Name                                 Value        Normal  ----------------------------------------------------------------------     IVC/SVC  ----------------------------------------------------------------------  IVC Diameter (Exp 2D)              1.50 cm        <=2.10    Vein mapping -   FINDINGS:  The right and left lower extremity venous systems were   evaluated using high resolution gray scale compression imaging. The   bilateral femoropopliteal, posterior tibial and peroneal veins are   normally compressible.     Right: The great saphenous vein is normally compressible and patent   throughout, maximum diameter 3.7 mm proximal thigh. The small saphenous   vein is normally compressible and patent throughout, maximum diameter   2.1 mm proximal calf.     Left: The great saphenous vein is normally compressible and patent   throughout, maximum diameter 3.7 mm proximal thigh. The small saphenous   vein is normally compressible and patent throughout, maximum diameter   2.6 mm mid calf.       Impression:        Patent  bilateral great and small saphenous veins, with vein   mapping performed.        Dante Gang, PA-C  CV Surgery

## 2021-11-25 NOTE — Progress Notes (Signed)
[facility] Progress Note  NP/PA Spectra [phone] and [phone]  Patient of Dr.      Patient: Raymond Cook   MRN: [id number]   Date of Service: 11/25/2021    Case discussed. Agree with assessment and plan  Assessment and Plan:   Unstable angina, known multi vessel CAD  Patient with known MV CAD awaiting revascularization with CABG next month presents with chest pain. Patient with known CAD with DES to LAD post MI 2015, MV CAD via LHC cath at [facility] 11/13/2021 showing Multivessel CAD involving LAD, LCx, and RCA. LCx is likely culprit for  symptoms. LAD lesion is hemodynamically significant by iFR. Unable to pass  iFR catheter distal to prox RCA lesion. He was woken up at 4-AM with severe substernal angina, he took NTG and went back to sleep. He awoke again at 6 Am with another episode of substernal chest pain this time associated with n/v. He thus presented to ED.  Trop negative x1. EKG with non specc ST changes. Echo 07/2021 EF 65-70%    - Plan for CABG today with Dr. Thedore Mins  - No current CP  - If has additional episodes of CP will start nitro paste  - Continue with ASA, statin and BB, Ranexa 1000 mg BID  - Echo from 07/2021 with preserved EF  - Hold ACE/ARB and Plavix given surgical work up    HLD  01/24/21 Lipid Panel: TC 104, TG 82, HDL 37, LDL 50    - Continue high intensity statin    HTN  As above    Will follow peripherally throughout remainder of admission.  Please call with any questions or concerns.  Discussed with patient and family at bedside.  Patient Active Problem List    Diagnosis Date Noted    *HUnstable angina [I20.0] 11/23/2021     Priority: Low    HHyperlipidemia [E78.5]      Priority: High    HBMI 28.0-28.9,adult [Z68.28] 11/24/2021     Priority: Low    SOB (shortness of breath) on exertion [R06.02]      Priority: Low    HStented coronary artery [Z95.5]      Priority: Low    NSTEMI (non-ST elevated myocardial infarction) [I21.4]      Priority: Low    HCoronary  artery disease [I25.10]      Priority: Low       Subjective:     No current symptoms over 24 hours    Prior Cardiac History/Testing:   Cath 11/13/21  1.The left main coronary artery is a large vessel, which bifurcates into   the left anterior descending artery and left circumflex artery. There are   no significant angiographic stenoses.   2. The left anterior descending artery is a large caliber vessel  that   gives off 1 small diagonal branch. There is an 80% stenosis involving the   proximal edge of prior long mid LAD stent. There are no other significant   areas of disease i nthe LAD   3. The left circumflex artery is a large caliber vessel, which gives off 2   major obtuse marginal artery(s). There is a 95% proximal LCx stenosis at   the bifurcation of hte OM1 and remaining LCx ([place] 1:1:1)   4. The right coronary artery is a  large dominant vessel. There is a   60-70% prox/mid lesion (heavily calcific) with a 60% lesion in the mid   RCA. There is a 60% ostial  RPDA stenosis.     iFR   An IL3.5 guiding catheter was advanced to LM. A 0.014" Runthrough was   advanced to the distal LAD.  An Ascist Navus catheter was an advanced but   could not enter the previously placed stent. Resting iFR = 0.88 which is   hemodynamically signfiicant.     We attempted iFR of the prox/mid RCA but again Navus iFR catheter could   not pass likely due to high-grade eccentric lesion.     CONCLUSION:   1. Multivessel CAD involving LAD, LCx, and RCA. LCx is likely culprit for   symptoms. LAD lesion is hemodynamically significant by iFR. Unable to pass   iFR catheter distal to prox RCA lesion         Echo 08/19/21  1. Normal left ventricular systolic function.  2. Estimated ejection fraction 65-70%.  3. Mild aortic root dilation 3.8 cm.  4. Trace to mild mitral regurgitation.  5. Trace to mild tricuspid regurgitation.      Physical Exam:     Vitals:    11/25/21 0837   BP: 149/83   Pulse: 65   Resp:    Temp:    SpO2:      Temp (24hrs),  Avg:98.1 F (36.7 C), Min:97.9 F (36.6 C), Max:98.4 F (36.9 C)    Weight: 88.5 kg (195 lb 3.2 oz)    Intake and Output Summary (Last 24 hours) at Date Time    Intake/Output Summary (Last 24 hours) at 11/25/2021 1027  Last data filed at 11/24/2021 2200  Gross per 24 hour   Intake 650 ml   Output 850 ml   Net -200 ml         General:  Well-nourished, well-developed.  Alert and in no apparent distress.  Eyes:  Anicteric sclera.  Pupils equal and round.    ENT:  Hearing grossly intact. Lips moist, color appropriate for race.  Neck:  2+ carotids bilaterally with normal upstroke; no carotid bruits.  No JVD.  No LAD.  Lungs:  Clear to auscultation bilaterally. No wheezes or crackles.  Respiratory effort unlabored, chest expansion symmetric.  Cardiovascular:  PMI non-displaced.  No tenderness to palpation.  Regular rate and rhythm. Normal S1 and S2.  No S3 or S4.  No murmurs or rubs.  Abdomen:  Soft, non-tender, non-distended.  No organomegaly.  No pulsatile masses.  Extremities:  2+ bilateral radial and pedal pulses.  No edema.  Skin:  Warm, dry, and intact. No rashes or lesions on exposed portions of skin.  Neurologic:  Moving all four extremities; exam grossly non-focal    Medications:      heparin infusion 25,000 units/500 mL (Cardiac/Low Intensity) 10.3 Units/kg/hr (11/24/21 1556)     Current Facility-Administered Medications   Medication Dose Route Frequency    amiodarone  400 mg Oral Q8H SCH    aspirin EC  81 mg Oral Daily    atorvastatin  80 mg Oral QHS    ezetimibe  10 mg Oral QHS    famotidine  20 mg Oral Q12H SCH    Or    famotidine  20 mg Intravenous Q12H SCH    metoprolol tartrate  25 mg Oral Q12H SCH    mupirocin   Nasal BID    ranolazine  1,000 mg Oral Q12H SCH       Labs:     Recent CMP   Recent Labs     11/25/21  0343   Glucose 95  BUN 14.0   Creatinine 1.0   Sodium 134*   Potassium 4.2   CO2 18   Calcium 8.1*   Albumin 3.5   AST (SGOT) 63*   ALT 79*   Globulin 3.4       Recent CARDIAC ENZYMES No  results for input(s): TROPONIN, ISTATTROPONI, CK in the last 24 hours.    Invalid input(s): TROPONINT, CKMB[24  Recent TSH Invalid input(s): TSH[24  Recent PT/PTT   Recent Labs     11/24/21  2008   PT INR 1.2*   PTT 47*     Recent CBC WITH DIFF   Recent Labs     11/25/21  0343   RBC 4.81   Hgb 14.5   Hematocrit 42.0   MCV 87.3   MCHC 34.5   RDW 12   MPV 10.8   Platelets 184       Recent LIPID PANEL No results for input(s): CHOL, TRIG, HDL in the last 24 hours.    Invalid input(s): LDLC, VLDLC, LRAT[24    Rads:     Radiology Results (24 Hour)       Procedure Component Value Units Date/Time    XR CHEST 2 VIEWS [[id number]] Collected: 11/25/21 0811    Order Status: Completed Updated: 11/25/21 0814    Narrative:      HISTORY: Preop for CABG.     COMPARISON: 11/23/2021    FINDINGS:   Monitoring leads.    The heart is normal in size. The pulmonary vascular pattern is  unremarkable.  Lungs are clear. There is no effusion. There is no  pneumothorax.       Impression:        No acute cardiopulmonary process.    Geanie Cooley, MD   11/25/2021 8:12 AM    US Venous Duplex Doppler Leg Bilateral Limited [[id number]] Collected: 11/24/21 2009    Order Status: Completed Updated: 11/24/21 2012    Narrative:      HISTORY: Preop CABG vein mapping.    FINDINGS:  The right and left lower extremity venous systems were  evaluated using high resolution gray scale compression imaging. The  bilateral femoropopliteal, posterior tibial and peroneal veins are  normally compressible.    Right: The great saphenous vein is normally compressible and patent  throughout, maximum diameter 3.7 mm proximal thigh. The small saphenous  vein is normally compressible and patent throughout, maximum diameter  2.1 mm proximal calf.    Left: The great saphenous vein is normally compressible and patent  throughout, maximum diameter 3.7 mm proximal thigh. The small saphenous  vein is normally compressible and patent throughout, maximum diameter  2.6 mm mid  calf.      Impression:       Patent bilateral great and small saphenous veins, with vein  mapping performed.    Sylvester Harder, MD   11/24/2021 8:10 PM    US Carotid Duplex Dopp Comp Bilateral [[id number]] Collected: 11/24/21 2006    Order Status: Completed Updated: 11/24/21 2011    Narrative:      HISTORY: Preop CABG.    COMPARISON: None.    FINDINGS: The extracranial carotid arteries and proximal vertebral  arteries were evaluated with high resolution gray scale imaging, color  Doppler, and spectral waveform analysis.  Degree of stenosis is derived  from velocity criteria which are extrapolated from diameter data as  defined by the Society of Radiologists in Ultrasound Consensus  Conference.  Findings are as follows:    RIGHT CAROTID:  Imaging  demonstrates mild intimal thickening along the CCA and bulb.  There is mild irregular heterogeneous plaque in the right carotid  bulb/proximal ICA, resulting in a less than 50% stenosis by gray scale  imaging and velocity criteria. ICA to CCA ratio is 0.9.    LEFT CAROTID:  Imaging demonstrates mild intimal thickening along the CCA and bulb.  There is moderate irregular calcified plaque in the left carotid  bulb/proximal ICA, resulting in a less than 50% stenosis by gray scale  imaging and velocity criteria. ICA to CCA ratio is 0.7.    PROXIMAL VERTEBRAL ARTERIES: Patent. Antegrade flow.    MEASURED VELOCITIES (CM/S):    Right CCA:                 109  Right ICA (peak systolic):  98  Right ICA (end diastolic):   35  Right ECA:                 115    Left CCA:                 108  Left ICA (peak systolic):  76  Left ICA (end diastolic):   27  Left ECA:                 113        Impression:        1. Mild irregular heterogeneous plaque in the right carotid  bulb/proximal ICA, resulting in a less than 50% stenosis.  2. Moderate irregular calcified plaque in the left carotid bulb/proximal  ICA, resulting in a less than 50% stenosis.    Sylvester Harder, MD   11/24/2021 8:09 PM             Cardiographics:   Telemetry:  NSR 70s

## 2021-11-25 NOTE — Progress Notes (Signed)
Cardiac Surgery Progress Note  Date/Time:  11/26/21 4:43 AM   Patient Name:  Raymond Cook Age: 55 y.o.   Room:  FI220/FI220-01     Operative Profile:   POD#: 1    11/29:   CABG x 4 (LIMA-dLAD, SVG- PDA, RIMA-OM1, Lrad-OM2)             Surgeon: Austin Miles MD              From OR: Gtts: Cardene; Products: None; Rhythm: Paced over SB; Issues: none    LVEF: normal, RVEF normal  Cardiologist: [facility]     Synopsis   Indication for Surgery: CAD     Preop Course: Raymond Cook is a 55 y.o. male who has been referred for CV Surgery evaluation. Patient has a history of CAD with previous PCI (DES to LAD 2015) and presented to [facility] 11/13/2021 for a cath due to recurrent chest pain.  He was found to have progression of CAD and was referred for an elective CV Surgery evaluation. Patient presented to [facility] 11/23/2021 with acute chest pain. Cardiac enzymes were negative and he ruled in for ACS.  His past medical history is significant for Hypertension and EtoH abuse, Remote Tobacco Abuse (1ppd x 10 years quit 1996).     PMH:  has a past medical history of Coronary artery disease, Hyperlipidemia, NSTEMI (non-ST elevated myocardial infarction), SOB (shortness of breath) on exertion, and Stented coronary artery.    Daily Summary (see running discharge summary for prior events)  11/17: Outpatient cardiac cath revealed multivessel CAD- referred for elective CV surgery evaluation   11/27: Presents to ER with chest pain   11/29: OR, extubated    Plan:   Stable for transfer to SDU        - Encourage ambulation  -  Monitor heart rate, SB at baseline (45-55). Hold AVN blockers             System Assessment of Active Issues       Neurological?:  Pain management: Pain is well controlled on standard regimen     Cardiovascular:  Unstable angina, CAD s/p CABG   HTN   HLD     Pulmonary:  No active issues     Gastrointestinal:  No acute issues       Renal/GU:  Normal baseline renal function     Infectious Disease:  Clinically, no acute issue    Hematological:  Acute blood loss anemia post op- stable    Endocrinological:  stress hyperglycemia controlled on insulin gtt per protocol  HgbA1C: 5.6  Recent Labs     11/24/21  2008   Hemoglobin A1C 5.6       Subjective:   No complaints    Physical Exam:   BP 131/73 Comment: Standing  Pulse 89   Temp 98.2 F (36.8 C) (Oral)   Resp (!) 26   Ht 1.778 m (5\' 10" )   Wt 88.5 kg (195 lb 3.2 oz)   SpO2 96%   BMI 28.01 kg/m     Physical Exam  Neuro: Non focal , MAE  CV: S1S2, RRR, distal pulses palpable  Pulm: CTA with diminished bases  GI; soft, NT, ND, +BS  Skin: MSI cdi      CBC:  Recent Labs     11/26/21  0203   WBC 12.25*   Hgb 12.5   Hematocrit 35.9*   Platelets 145     BMP:  Recent Labs  11/26/21  0203   Sodium 139   Potassium 4.6   Chloride 111   CO2 21   BUN 13.0   Creatinine 0.9   Glucose 114*   Magnesium 2.3   Calcium 8.0*                Signed by: Felecia Jan, NP

## 2021-11-25 NOTE — Plan of Care (Signed)
Cardiac symptoms: patient denies  Diet: NPO for CABG  Fluid Restriction? N/A  Mobility: standby assist    ~1144 patient taken to CVOR pre-op with spouse at bedside on stretcher and cardiac monitor. All belongings taken by family. Report given to [facility].    Nursing Plan:   -NPO for CABG  -Monitor for chest pain    Problem: Safety  Goal: Patient will be free from injury during hospitalization  Outcome: Progressing  Flowsheets (Taken 11/25/2021 1343)  Patient will be free from injury during hospitalization:   Assess patient's risk for falls and implement fall prevention plan of care per policy   Provide and maintain safe environment   Use appropriate transfer methods   Ensure appropriate safety devices are available at the bedside   Include patient/ family/ care giver in decisions related to safety   Hourly rounding  Goal: Patient will be free from infection during hospitalization  Outcome: Progressing  Flowsheets (Taken 11/25/2021 1343)  Free from Infection during hospitalization:   Assess and monitor for signs and symptoms of infection   Monitor lab/diagnostic results   Monitor all insertion sites (i.e. indwelling lines, tubes, urinary catheters, and drains)   Encourage patient and family to use good hand hygiene technique     Problem: Psychosocial and Spiritual Needs  Goal: Demonstrates ability to cope with hospitalization/illness  Outcome: Progressing  Flowsheets (Taken 11/25/2021 1343)  Demonstrates ability to cope with hospitalizations/illness:   Encourage verbalization of feelings/concerns/expectations   Provide quiet environment   Assist patient to identify own strengths and abilities   Encourage patient to set small goals for self   Encourage participation in diversional activity   Reinforce positive adaptation of new coping behaviors   Include patient/ patient care companion in decisions     Problem: Pre-op Phase - Cardiac Surgery  Goal: Patient will be ready for surgery  Outcome: Progressing  Flowsheets  (Taken 11/25/2021 1343)  Patient will be ready for surgery:   Document height and weight   Verify that all platelet inhibitors are discontinued   CHG shower/bath the night before and morning of surgery   Ensure all pre-op orders are complete   NPO after midnight   Blood type and crossmatch completed   Complete pre-op checklist   Send the patient belongings with the family or to receiving unit   Ensure patient and family is fully educated regarding the procedure and expectations

## 2021-11-25 NOTE — Brief Op Note (Signed)
BRIEF OP NOTE    Date Time: 11/25/21 6:05 PM    Patient Name:   Raymond Cook    Date of Operation:   11/25/2021    Providers Performing:   Surgeon(s):  Austin Miles, MD  Aleene Davidson, PA  Lissa Merlin, [place]    Assistant (s):   Circulator: Jamse Mead, RN  Perfusionist: Kenton Kingfisher, CCP  Relief Circulator: Isidore Moos, RN; Zane Herald, RN  Relief Scrub: Dorise Hiss  Scrub Person: Guadalupe Dawn, RN  Second Assistant: Shon Hale  Relief Second Scrub: Janey Greaser, RN    Operative Procedure:   Urgent CABG x 4 (LIMA-distLAD, RIMA-OM1, Rad-OM2, SVG-PDA)  LLE EVH  LUE ERH    Preoperative Diagnosis:   CAD  UA    Postoperative Diagnosis:   same    Anesthesia:   General    Estimated Blood Loss:    75ml    Implants:   * No implants in log *    Drains:   Drains: 4 x 24Fr Blakes. Rt in Rt chest, Lt in Lt chest and other 2 mediastinal    Specimens:   * No specimens in log *      Findings:   LAD, OM1, OM2 - 1.48mm small and thin  PDA - 1.33mm  Conduits - good  Excellent Medistim flows in BIMA    Complications:   Nil      Signed by: Austin Miles, MD                                                                           [facility] [place] HEART OR

## 2021-11-25 NOTE — Progress Notes (Signed)
11/25/21 0700   Spirometry Pre and/or Post   Procedure Location and Type Bedside- Pre Only   Pre FVC 4.97 L   Pre FEV1 3.94 L   Pre FEV1/FVC % (Calculated) 79   Spirometry completed; ATS/ERS criteria met.  To view full .pdf report, click on:  Chart Review > Procedures > Spirometry > View Image.

## 2021-11-25 NOTE — Plan of Care (Signed)
Assumed patient care @ 1930.  Cardiac symptoms: No complaints of cardiac symptoms since the beginning of the shift.  Diet: NPO since midnight.  Mobility: Standby assistance.    Nursing Plan:   -CABG on 11/29 at 1400.  -NPO since midnight for CABG.    Problem: Safety  Goal: Patient will be free from injury during hospitalization  Outcome: Progressing  Flowsheets (Taken 11/25/2021 0305)  Patient will be free from injury during hospitalization:   Assess patient's risk for falls and implement fall prevention plan of care per policy   Provide and maintain safe environment   Use appropriate transfer methods   Ensure appropriate safety devices are available at the bedside   Hourly rounding   Include patient/ family/ care giver in decisions related to safety  Goal: Patient will be free from infection during hospitalization  Outcome: Progressing  Flowsheets (Taken 11/25/2021 0305)  Free from Infection during hospitalization:   Assess and monitor for signs and symptoms of infection   Monitor lab/diagnostic results   Monitor all insertion sites (i.e. indwelling lines, tubes, urinary catheters, and drains)   Encourage patient and family to use good hand hygiene technique     Problem: Pain  Goal: Pain at adequate level as identified by patient  Outcome: Progressing  Flowsheets (Taken 11/23/2021 2203 by Mamie Nick, RN)  Pain at adequate level as identified by patient:   Identify patient comfort function goal   Assess pain on admission, during daily assessment and/or before any "as needed" intervention(s)   Reassess pain within 30-60 minutes of any procedure/intervention, per Pain Assessment, Intervention, Reassessment (AIR) Cycle   Evaluate if patient comfort function goal is met   Evaluate patient's satisfaction with pain management progress     Problem: Psychosocial and Spiritual Needs  Goal: Demonstrates ability to cope with hospitalization/illness  Outcome: Progressing  Flowsheets (Taken 11/25/2021 0305)  Demonstrates  ability to cope with hospitalizations/illness:   Encourage verbalization of feelings/concerns/expectations   Provide quiet environment   Assist patient to identify own strengths and abilities   Include patient/ patient care companion in decisions     Problem: Moderate/High Fall Risk Score >5  Goal: Patient will remain free of falls  Outcome: Progressing  Flowsheets (Taken 11/24/2021 2030)  High (Greater than 13):   HIGH-Visual cue at entrance to patient's room   HIGH-Utilize chair pad alarm for patient while in the chair   HIGH-Bed alarm on at all times while patient in bed   HIGH-Apply yellow "Fall Risk" arm band   HIGH-Initiate use of floor mats as appropriate   HIGH-Consider use of low bed     Problem: Pre-op Phase - Cardiac Surgery  Goal: Patient will be ready for surgery  Outcome: Progressing  Flowsheets (Taken 11/25/2021 0305)  Patient will be ready for surgery:   Document height and weight   Verify that all platelet inhibitors are discontinued   CHG shower/bath the night before and morning of surgery   NPO after midnight   Blood type and crossmatch completed

## 2021-11-25 NOTE — Transfer of Care (Signed)
Anesthesia Transfer of Care Note    Patient: Raymond Cook    Procedures performed: Procedure(s):  CORONARY ARTERY BYPASS x4; BIMA  ENDOSCOPIC,VEIN HARVEST  ENDOSCOPIC, RADIAL ARTERY HARVEST    Anesthesia type: General ETT    Patient location:CVICU    Last vitals:   Vitals:    11/25/21 1830   BP: 101/54   Pulse: 69   Resp:    Temp:    SpO2: 99%       Post pain: sedated     Mental Status:sedated    Respiratory Function: intubated    Cardiovascular: stable on nicardipine    Nausea/Vomiting: sedated    Hydration Status: see record    Post assessment: no apparent anesthetic complications and no reportable events. Report to RN/team at bedside. Pt doing well at this time. Vss. precedex sedation. TXA still running and insulin gtt.     Signed by: Ames Coupe, CRNA  11/25/21 6:33 PM

## 2021-11-25 NOTE — H&P (Signed)
H&P r/v'd  Hospital course r/v'd  Patient seen and examined today. Long discussion about indication/risks/benefits/alternatives of surgery explained. All questions answered. Agree to proceed.    Raymond Hissong, MD  [phone] or [phone]

## 2021-11-25 NOTE — Progress Notes (Signed)
Time out pre-procedure paused performed with RN  .  Felecia Jan, NP gave go ahead. All necessary equipment was available. Patient extubated to [facility]           11/25/21 2200   Extubation   Extubation reason CVICU protocol   Extubated to Nasal cannula   Adverse Reactions None

## 2021-11-25 NOTE — OR Nursing (Signed)
1230 - Patient and family seen in pre -op holding area. Patient verified name, date of birth and surgical procedure being performed today. H&P reviewed and all consents signed and present in patient's chart. Patient acknowledges teachings with no further questions at this time.     1625 - Report given to  Enrique Sack, CVICU RN    [phone]- Family updated via phone.

## 2021-11-25 NOTE — Anesthesia Preprocedure Evaluation (Addendum)
Relevant Problems   PULMONARY   (+) SOB (shortness of breath) on exertion      CARDIO   (+) Coronary artery disease   (+) NSTEMI (non-ST elevated myocardial infarction)   (+) SOB (shortness of breath) on exertion   (+) Stented coronary artery   (+) Unstable angina           Pre-evaluation Note Incomplete - DO NOT USE FOR CLINICAL DECISIONS    Anesthesia Plan        (Patient with known MV CAD awaiting revascularization with CABG next month presents with chest pain. Patient with known CAD with DES to LAD post MI 2015, MV [facility] 11/17/2022showingMultivessel CAD involving LAD, LCx, and RCA. LCx is likely culprit for symptoms. LAD lesion is hemodynamically significant by iFR. Unable to pass iFR catheter distal to prox RCA lesion. He was woken up at 4-AM with severe substernal angina, he took NTG and went back to sleep. He awoke again at 6 Am with another episode of substernal chest pain this time associated with n/v. He thus presented to ED. Trop negative x1. EKG with non specc ST changes. Echo 07/2021 EF 65-70%    - Plan for CABG today with Dr. Thedore Mins  - No current CP  - If has additional episodes of CP will start nitro paste  - Continue with ASA, statin and BB, Ranexa 1000 mg BID  - Echo from 07/2021 with preserved EF  - Hold ACE/ARB and Plavix given surgical work up      Study Result    Narrative & Impression  Name:     Raymond Cook  Age:     55 years  DOB:     Mar 04, 1966  Gender:     Male  MRN:     [id number]  Wt:     196 lb  BSA:     2.11 m2  Systolic BP:     540 mmHg  Diastolic BP:     73 mmHg  Technical Quality:     Adequate  Exam Date/Time:     11/24/2021 7:28 AM  Study Site:     [facility]  Exam Type:     ECHOCARDIOGRAM ADULT COMPLETE W CLR/ DOPP WAVEFORM    Study Info  Indications      unstable angina -  Procedure    Complete two-dimensional, color flow and spectral Doppler transthoracic  echocardiogram is performed.    Reading Physician:     Enis Gash  MD    Staff  Sonographer:     Tonette Lederer RDCS  Ordering Physician:     Milus Mallick    70 in      History/Risk Factors  Hypertension:     Yes  Hyperlipidemia:     Yes  Coronary Artery Disease     Yes      Summary    * The left ventricle is normal in size (LVEDD: 4.9 cm, LVESD: 3.0 cm).    * Left ventricular systolic function is normal with an ejection fraction by  Biplane Method of Discs of  60 %.    * Normal right ventricular systolic function.    * Atria are normal in size.    * No significant valvular abnormalities.    * Compared to the prior study on 01/19/2017, no major changes are noted.      Findings  Left Ventricle    The left ventricle is normal in size (LVEDD: 4.9  cm, LVESD: 3.0 cm). There  is concentric left ventricular remodeling. Left ventricular systolic function  is normal with an ejection fraction by Biplane Method of Discs of  60 %. Left  ventricular segmental wall motion is normal. Left ventricular diastolic  filling parameters demonstrate normal diastolic function.  Right Ventricle    The right ventricular cavity size is normal in size. Normal right  ventricular systolic function. TAPSE = 2.4 cm, S' = 12 cm/s, fractional area  change = 40 %.      Left Atrium    The left atrium is normal in size.    Right Atrium    The right atrium is normal in size.    Atrial Septum    No evidence of interatrial shunt by color Doppler.      Aortic Valve    The aortic valve is tricuspid. There is no aortic stenosis. There is no  aortic regurgitation.    Pulmonary Valve    The pulmonic valve is structurally normal. There is no pulmonic  regurgitation.    Mitral Valve    The mitral valve is structurally normal. There is trace mitral  regurgitation.    Tricuspid Valve    The tricuspid valve is structurally normal. There is trace tricuspid  regurgitation. No pulmonary hypertension with estimated right ventricular  systolic pressure of 24 mmHg.      Pericardium / Pleural Effusion    No pericardial  effusion visualized.    Inferior Vena Cava    The IVC is normal in size with > 50% respiratory variance consistent with  normal RA pressure of 3 mmHg.    Aorta    The aortic root is normal in size. The ascending aorta is normal in size.      Measurements  2D Measurements  ----------------------------------------------------------------------  Name                                 Value        Normal  ----------------------------------------------------------------------    Parasternal 2D  ----------------------------------------------------------------------   IVS Diastolic Thickness  (2D)                               1.00 cm     0.60-1.00   LVID Diastole (2D)                 4.90 cm     4.20-5.80    LVIW Diastolic Thickness  (2D)                               1.10 cm     0.60-1.05   LVID Systole (2D)                  3.00 cm     2.50-4.00   LVOT Diameter                      2.10 cm                 LA Dimension (2D)                  3.40 cm     3.00-4.10   Ao Root Diameter (2D)              3.30 cm  2.70-3.70   Prox Asc Ao Diameter               3.30 cm     2.60-3.40     LV Ejection Fraction 2D  ----------------------------------------------------------------------  LV EF (BP MOD)                        60 %         52-72     Apical 2D Dimensions  ----------------------------------------------------------------------   RV Basal Diastolic  Dimension                          3.60 cm     2.50-4.10   LA Volume Index (BP A-L)        20.37 ml/m2       <=34.00   RA Area (4C)                     14.10 cm2       <=18.00  M-mode Measurements  ----------------------------------------------------------------------  Name                                 Value        Normal  ----------------------------------------------------------------------    M-Mode  ----------------------------------------------------------------------  TAPSE                              2.36 cm        >=1.60  LVOT/Aortic Valve Doppler  Measurements  ----------------------------------------------------------------------  Name                                 Value        Normal  ----------------------------------------------------------------------    LVOT Doppler  ----------------------------------------------------------------------  LVOT Peak Velocity                1.01 m/s                   AoV Doppler  ----------------------------------------------------------------------  AV Peak Velocity                  1.16 m/s                 AV Peak Gradient                    5 mmHg                 AV Mean Gradient                    3 mmHg           <20   AV Area (Cont Eq VTI)             2.87 cm2        >=3.00   AV V1/V2 Ratio                        0.87  RVOT/Pulmonic Valve Doppler Measurements  ----------------------------------------------------------------------  Name                                 Value  Normal  ----------------------------------------------------------------------    PV Doppler  ----------------------------------------------------------------------  PV Peak Velocity                  0.98 m/s  Mitral Valve Measurements  ----------------------------------------------------------------------  Name                                 Value        Normal  ----------------------------------------------------------------------    MV Doppler  ----------------------------------------------------------------------  MV E Peak Velocity                0.74 m/s                 MV A Peak Velocity                0.54 m/s                 MV E/A                                1.36                   MV Annular TDI  ----------------------------------------------------------------------  MV Septal e' Velocity             0.09 m/s        >=0.08   MV E/e' (Septal)                      8.01        <=8.00   MV Lateral e' Velocity            0.14 m/s        >=0.10   MV E/e' (Lateral)                     5.33        <=8.00  Tricuspid Valve  Measurements  ----------------------------------------------------------------------  Name                                 Value        Normal  ----------------------------------------------------------------------    TV Regurgitation Doppler  ----------------------------------------------------------------------  RA Pressure                         3 mmHg           <=3  Aorta / Venous Measurements  ----------------------------------------------------------------------  Name                                 Value        Normal  ----------------------------------------------------------------------    IVC/SVC  ----------------------------------------------------------------------  IVC Diameter (Exp 2D)              1.50 cm        <=2.10    Linked Documents    View Image    Imaging    Echocardiogram Adult Complete W Clr/ Dopp Waveform (Order: [id number]) - 11/23/2021    Result History    Echocardiogram Adult Complete W Clr/ Dopp Waveform (Order #[id number]) on 11/24/2021 - Order Result History Report    Linked Academic librarian  Electronically signed by Enis Gash, MD on 11/24/21 at 1141 EST    )                                     Signed by: Barbara Cower, MD 11/25/21 11:59 AM

## 2021-11-26 ENCOUNTER — Other Ambulatory Visit: Payer: Self-pay

## 2021-11-26 ENCOUNTER — Encounter: Payer: Self-pay | Admitting: Thoracic Surgery (Cardiothoracic Vascular Surgery)

## 2021-11-26 ENCOUNTER — Inpatient Hospital Stay: Payer: No Typology Code available for payment source

## 2021-11-26 DIAGNOSIS — I2510 Atherosclerotic heart disease of native coronary artery without angina pectoris: Secondary | ICD-10-CM

## 2021-11-26 LAB — CBC
Absolute NRBC: 0 10*3/uL (ref 0.00–0.00)
Hematocrit: 35.9 % — ABNORMAL LOW (ref 37.6–49.6)
Hgb: 12.5 g/dL (ref 12.5–17.1)
MCH: 30.9 pg (ref 25.1–33.5)
MCHC: 34.8 g/dL (ref 31.5–35.8)
MCV: 88.6 fL (ref 78.0–96.0)
MPV: 10.7 fL (ref 8.9–12.5)
Nucleated RBC: 0 /100 WBC (ref 0.0–0.0)
Platelets: 145 10*3/uL (ref 142–346)
RBC: 4.05 10*6/uL — ABNORMAL LOW (ref 4.20–5.90)
RDW: 12 % (ref 11–15)
WBC: 12.25 10*3/uL — ABNORMAL HIGH (ref 3.10–9.50)

## 2021-11-26 LAB — HEPATIC FUNCTION PANEL
ALT: 76 U/L — ABNORMAL HIGH (ref 0–55)
AST (SGOT): 80 U/L — ABNORMAL HIGH (ref 5–41)
Albumin/Globulin Ratio: 1.3 (ref 0.9–2.2)
Albumin: 3.2 g/dL — ABNORMAL LOW (ref 3.5–5.0)
Alkaline Phosphatase: 37 U/L (ref 37–117)
Bilirubin Direct: 0.5 mg/dL (ref 0.0–0.5)
Bilirubin Indirect: 0.3 mg/dL (ref 0.2–1.0)
Bilirubin, Total: 0.8 mg/dL (ref 0.2–1.2)
Globulin: 2.5 g/dL (ref 2.0–3.6)
Protein, Total: 5.7 g/dL — ABNORMAL LOW (ref 6.0–8.3)

## 2021-11-26 LAB — BASIC METABOLIC PANEL
Anion Gap: 7 (ref 5.0–15.0)
BUN: 13 mg/dL (ref 9.0–28.0)
CO2: 21 mEq/L (ref 17–29)
Calcium: 8 mg/dL — ABNORMAL LOW (ref 8.5–10.5)
Chloride: 111 mEq/L (ref 99–111)
Creatinine: 0.9 mg/dL (ref 0.5–1.5)
Glucose: 114 mg/dL — ABNORMAL HIGH (ref 70–100)
Potassium: 4.6 mEq/L (ref 3.5–5.3)
Sodium: 139 mEq/L (ref 135–145)

## 2021-11-26 LAB — GLUCOSE WHOLE BLOOD - POCT
Whole Blood Glucose POCT: 117 mg/dL — ABNORMAL HIGH (ref 70–100)
Whole Blood Glucose POCT: 112 mg/dL — ABNORMAL HIGH (ref 70–100)
Whole Blood Glucose POCT: 111 mg/dL — ABNORMAL HIGH (ref 70–100)
Whole Blood Glucose POCT: 105 mg/dL — ABNORMAL HIGH (ref 70–100)
Whole Blood Glucose POCT: 108 mg/dL — ABNORMAL HIGH (ref 70–100)
Whole Blood Glucose POCT: 111 mg/dL — ABNORMAL HIGH (ref 70–100)
Whole Blood Glucose POCT: 120 mg/dL — ABNORMAL HIGH (ref 70–100)
Whole Blood Glucose POCT: 113 mg/dL — ABNORMAL HIGH (ref 70–100)
Whole Blood Glucose POCT: 162 mg/dL — ABNORMAL HIGH (ref 70–100)
Whole Blood Glucose POCT: 169 mg/dL — ABNORMAL HIGH (ref 70–100)
Whole Blood Glucose POCT: 136 mg/dL — ABNORMAL HIGH (ref 70–100)
Whole Blood Glucose POCT: 128 mg/dL — ABNORMAL HIGH (ref 70–100)

## 2021-11-26 LAB — GFR: EGFR: >60

## 2021-11-26 LAB — MAGNESIUM: Magnesium: 2.3 mg/dL (ref 1.6–2.6)

## 2021-11-26 MED ORDER — FUROSEMIDE 10 MG/ML IJ SOLN
20.0000 mg | Freq: Once | INTRAMUSCULAR | Status: AC
Start: 2021-11-26 — End: 2021-11-26
  Administered 2021-11-26: 20 mg via INTRAVENOUS
  Filled 2021-11-26: qty 4

## 2021-11-26 MED ORDER — LISINOPRIL 20 MG PO TABS
20.0000 mg | ORAL_TABLET | Freq: Every day | ORAL | Status: DC
Start: 2021-11-26 — End: 2021-11-29
  Administered 2021-11-26 – 2021-11-28 (×3): 20 mg via ORAL
  Filled 2021-11-26: qty 1
  Filled 2021-11-26: qty 2
  Filled 2021-11-26: qty 1

## 2021-11-26 MED ORDER — TRAMADOL HCL 50 MG PO TABS
100.0000 mg | ORAL_TABLET | Freq: Once | ORAL | Status: AC
Start: 2021-11-26 — End: 2021-11-26
  Administered 2021-11-26: 100 mg via ORAL
  Filled 2021-11-26: qty 2

## 2021-11-26 MED ORDER — INSULIN LISPRO 100 UNIT/ML SOLN (WRAP)
1.0000 [IU] | Freq: Three times a day (TID) | Status: DC
Start: 2021-11-26 — End: 2021-12-01

## 2021-11-26 MED ORDER — GABAPENTIN 300 MG PO CAPS
300.0000 mg | ORAL_CAPSULE | Freq: Three times a day (TID) | ORAL | Status: DC
Start: 2021-11-26 — End: 2021-11-30
  Administered 2021-11-26 – 2021-11-30 (×13): 300 mg via ORAL
  Filled 2021-11-26 (×13): qty 1

## 2021-11-26 MED ORDER — DEXTROSE 50 % IV SOLN
25.0000 g | INTRAVENOUS | Status: DC | PRN
Start: 2021-11-26 — End: 2021-11-30

## 2021-11-26 MED ORDER — DEXTROSE 10 % IV BOLUS
25.0000 g | INTRAVENOUS | Status: DC | PRN
Start: 2021-11-26 — End: 2021-11-30

## 2021-11-26 MED ORDER — DEXTROSE 5% IV BOLUS
250.0000 mL | INTRAVENOUS | Status: DC | PRN
Start: 2021-11-26 — End: 2021-11-30

## 2021-11-26 MED ORDER — LIDOCAINE(URO-JET) 2% JELLY (WRAP)
Freq: Once | CUTANEOUS | Status: DC | PRN
Start: 2021-11-26 — End: 2021-12-02
  Filled 2021-11-26 (×2): qty 10
  Filled 2021-11-26: qty 5

## 2021-11-26 MED ORDER — GLUCAGON 1 MG IJ SOLR (WRAP)
1.0000 mg | INTRAMUSCULAR | Status: DC | PRN
Start: 2021-11-26 — End: 2021-11-30

## 2021-11-26 MED ORDER — OXYCODONE HCL 5 MG PO TABS
5.0000 mg | ORAL_TABLET | Freq: Four times a day (QID) | ORAL | Status: DC | PRN
Start: 2021-11-26 — End: 2021-11-29
  Administered 2021-11-26 – 2021-11-27 (×6): 5 mg via ORAL
  Filled 2021-11-26 (×6): qty 1

## 2021-11-26 MED ORDER — HYDROMORPHONE HCL 0.5 MG/0.5 ML IJ SOLN
0.2500 mg | Freq: Once | INTRAMUSCULAR | Status: AC
Start: 2021-11-26 — End: 2021-11-26
  Administered 2021-11-26: 0.25 mg via INTRAVENOUS
  Filled 2021-11-26: qty 1

## 2021-11-26 MED ORDER — INSULIN LISPRO 100 UNIT/ML SOLN (WRAP)
1.0000 [IU] | Freq: Every evening | Status: DC
Start: 2021-11-26 — End: 2021-12-01

## 2021-11-26 MED ORDER — TAMSULOSIN HCL 0.4 MG PO CAPS
0.4000 mg | ORAL_CAPSULE | Freq: Every day | ORAL | Status: DC
Start: 2021-11-26 — End: 2021-12-02
  Administered 2021-11-26 – 2021-12-02 (×7): 0.4 mg via ORAL
  Filled 2021-11-26 (×7): qty 1

## 2021-11-26 MED ORDER — TRAMADOL HCL 50 MG PO TABS
100.0000 mg | ORAL_TABLET | Freq: Four times a day (QID) | ORAL | Status: DC | PRN
Start: 2021-11-26 — End: 2021-12-02
  Administered 2021-11-26 – 2021-11-30 (×8): 100 mg via ORAL
  Filled 2021-11-26 (×8): qty 2

## 2021-11-26 NOTE — Nursing Progress Note (Signed)
Pt transferred from CVICU at 1145. Pt VSS. Pt on 2LNC. CT & pacing wires present upon admission.     2-person assessment w/ Daryl Eastern, RN. Pt skin intact. Dressings over surgical incisions present upon admission.     Belongings: Arts administrator    4 eyes in 4 hours pressure injury assessment note:      Completed with: Daryl Eastern, RN  Unit & Time admitted: CVSD, 1145            Bony Prominences: Check appropriate box; if wound is present enter wound assessment in LDA     Occiput:                 [x] WNL  []  Wound present  Face:                     [x] WNL  []  Wound present  Ears:                      [x] WNL  []  Wound present  Spine:                    [x] WNL  []  Wound present  Shoulders:             [x] WNL  []  Wound present  Elbows:                  [x] WNL  []  Wound present  Sacrum/coccyx:     [x] WNL  []  Wound present  Ischial Tuberosity:  [x] WNL  []  Wound present  Trochanter/Hip:      [x] WNL  []  Wound present  Knees:                   [x] WNL  []  Wound present  Ankles:                   [x] WNL  []  Wound present  Heels:                    [x] WNL  []  Wound present  Other pressure areas:  []  Wound location       Device related: []  Device name:         LDA completed if wound present: N/A  Consult WOCN if necessary    Other skin related issues, ie tears, rash, etc, document in Integumentary flowsheet

## 2021-11-26 NOTE — Anesthesia Postprocedure Evaluation (Signed)
Anesthesia Post Evaluation    Patient: Raymond Cook    Procedure(s):  CORONARY ARTERY BYPASS x4; BIMA  ENDOSCOPIC,VEIN HARVEST  ENDOSCOPIC, RADIAL ARTERY HARVEST    Anesthesia type: general    Last Vitals:   Vitals Value Taken Time   BP 131/83 11/26/21 0400   Temp 36.8 C (98.2 F) 11/26/21 0400   Pulse 77 11/26/21 0400   Resp 33 11/26/21 0400   SpO2 97 % 11/26/21 0400                 Anesthesia Post Evaluation:     Patient Evaluated: ICU                Anesthetic complications: No          Cardiovascular status: acceptable  Respiratory status: acceptable  Hydration status: acceptable  Comments: CV and respiratory status as per ICU team        Signed by: Wayland Salinas, MD, 11/26/2021 4:30 AM

## 2021-11-26 NOTE — PT Progress Note (Signed)
Physical Therapy Evaluation  [name]      Post Acute Care Therapy Recommendations:     Discharge Recommendations:  Home with supervision    DME needs IF patient is discharging home: Patient already has needed equipment    Therapy discharge recommendations may change with patient status.  Please refer to most recent note for up-to-date recommendations.    Assessment:   Significant Findings: none    Chistian Kasler is a 55 y.o. male admitted 11/23/2021 s/p CABG x 4.  Patient presents with decreased strength, decreased activity tolerance, impaired balance and gait deficits. Reviewed move in the tube precautions. Pt is min A for sit to stand transfer. Pt is able to march in place and take a few steps forward/backward min A. Pt would benefit from continued acute skilled physical therapy services to address these deficits and maximize independence with functional mobility. Recommend D/C to home with supervision.    Impairments: Assessment: Decreased UE strength;Decreased LE strength;Decreased safety/judgement during functional mobility;Decreased endurance/activity tolerance;Impaired coordination;Decreased functional mobility;Decreased balance;Gait impairment.     Therapy Diagnosis: Impaired functional mobility     Rehabilitation Potential: Prognosis: Good;With continued PT status post acute discharge    Treatment Activities: Physical therapy evaluation, transfer training, gait training, therapeutic exercise, pt education and discharge planning     Educated the patient to role of physical therapy, plan of care, goals of therapy and HEP, safety with mobility and ADLs, energy conservation techniques, discharge instructions, home safety.    Plan:   Treatment/Interventions: Exercise, Gait training, Neuromuscular re-education, Functional transfer training, LE strengthening/ROM, Endurance training, Patient/family training, Equipment eval/education, Bed mobility, Continued evaluation     PT Frequency: 3-4x/wk    Risks/Benefits/POC Discussed with Pt/Family: With patient       [facility]  Bed: FI258/FI258-01     Precautions and Contraindications:   Other Precautions: move in the tube precautions    Consult received for [name] for PT Evaluation and Treatment.  Patient's medical condition is appropriate for Physical therapy intervention at this time.    Medical Diagnosis: Unstable angina [I20.0]      History of Present Illness:   Ehsan Corvin is a 55 y.o. male admitted on 11/23/2021 who " who has been referred for CV Surgery evaluation. Patient has a history of CAD with previous PCI (DES to LAD 2015) and presented to [facility] 11/13/2021 for a cath due to recurrent chest pain.  He was found to have progression of CAD and was referred for an elective CV Surgery evaluation. Patient presented to [facility] 11/23/2021 with acute chest pain. Cardiac enzymes were negative and he ruled in for ACS.  His past medical history is significant for Hypertension and EtoH abuse, Remote Tobacco Abuse (1ppd x 10 years quit 1996).      PMH:  has a past medical history of Coronary artery disease, Hyperlipidemia, NSTEMI (non-ST elevated myocardial infarction), SOB (shortness of breath) on exertion, and Stented coronary artery," per chart review.    Past Medical/Surgical History:  Past Medical History:   Diagnosis Date    Coronary artery disease     Hyperlipidemia     NSTEMI (non-ST elevated myocardial infarction)     SOB (shortness of breath) on exertion     Stented coronary artery         X-Rays/Tests/Labs:  XR CHEST 2 VIEWS    Result Date: 11/25/2021  No acute cardiopulmonary process. Geanie Cooley, MD  11/25/2021 8:12  AM    X-ray chest AP portable (daily)    Result Date: 11/26/2021  Mildly increased basal atelectasis. Clide Cliff, MD  11/26/2021 8:16 AM    X-ray chest AP portable (once)    Result Date: 11/25/2021   Interval changes related to cardiac surgery with  support apparatus positioned as described above. Mosetta Putt, MD  11/25/2021 9:10 PM    XR Chest  AP Portable    Result Date: 11/23/2021  No acute thoracic abnormality. Nonda Lou, MD  11/23/2021 10:47 AM    US Carotid Duplex Dopp Comp Bilateral    Result Date: 11/24/2021  1. Mild irregular heterogeneous plaque in the right carotid bulb/proximal ICA, resulting in a less than 50% stenosis. 2. Moderate irregular calcified plaque in the left carotid bulb/proximal ICA, resulting in a less than 50% stenosis. Sylvester Harder, MD  11/24/2021 8:09 PM    US Venous Duplex Doppler Leg Bilateral Limited    Result Date: 11/24/2021   Patent bilateral great and small saphenous veins, with vein mapping performed. 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 Nursing clears patient for therapy.     Patient Goal: None stated    Pain Assessment  Pain Assessment: Numeric Scale (0-10)  Pain Score: 6-moderate pain  Pain Location:  (Surgical Site)  Pain Intervention(s): Repositioned;Ambulation/increased activity;Rest    Objective:   Observation of Patient/Vital Signs:  Patient is seated in a bedside chair with telemetry, radial arterial line, peripheral IV, O2 at 2 liters/minute via nasal cannula, chest tube, and external pacing box in place.  Pt wore mask during therapy session:No      Observation of Patient/Vital signs:       Cognition/Neuro Status  Arousal/Alertness: Appropriate  responses to stimuli  Attention Span: Appears intact  Orientation Level: Oriented X4  Memory: Decreased recall of precautions  Following Commands: Follows multistep commands consistently  Safety Awareness: minimal verbal instruction  Insights: Educated in safety awareness;Decreased awareness of deficits  Problem Solving: Assistance required to identify errors made;minimal assistance  Motor Planning: intact  Coordination: intact    Musculoskeletal Examination:  Gross ROM  Right Upper Extremity ROM: within functional limits  Left Upper Extremity ROM: within functional limits  Right Lower Extremity ROM: within functional limits  Left Lower Extremity ROM: within functional limits    Gross Strength  Right Upper Extremity Strength: within functional limits  Left Upper Extremity Strength: within functional limits  Right Lower Extremity Strength: 4/5  Left Lower Extremity Strength: 4/5         Functional Mobility:  Sit to Stand: Minimal Assist  Stand to Sit: Minimal Assist         Ambulation:  PMP - Progressive Mobility Protocol   PMP Activity: Step 6 - Walks in Room  Distance Walked (ft) (Step 6,7): 3 Feet     Ambulation: Minimal Assist  Pattern: decreased cadence;decreased step length (marching in place, steps forward/backward)  Balance:  Sitting - Static: Good  Sitting - Dynamic: Good  Standing - Static: Fair  Standing - Dynamic: Fair         Participation and Activity Tolerance:  Participation Effort: good  Endurance: Tolerates 10 - 20 min exercise with multiple rests      Patient left with call bell within reach, all needs met, SCDs in place, fall mat in place, bed alarm n/a, chair alarm on and all questions answered. RN notified of session outcome and patient response.       Goals:   Goals  Goal Formulation: With patient  Time for Goal Acheivement: 5 visits  Goals: Select goal  Pt Will Go Supine To Sit: with supervision, to maximize functional mobility and independence  Pt Will Perform Sit To Supine: with  supervision, to maximize functional mobility and independence  Pt Will Stand: with supervision, to maximize functional mobility and independence  Pt Will Ambulate: > 200 feet, with rolling walker, with supervision, to maximize functional mobility and independence  Pt Will Go Up / Down Stairs: 1 flight, with supervision, With rail, to maximize functional mobility and independence       PPE worn during session: procedural mask, goggles, and gloves  Tech present: No  PPE worn by tech: N/A    Time of treatment:   PT Received On: 11/26/21  Start Time: 0905  Stop Time: 0930  Time Calculation (min): 25 min     Smith Robert, DPT, pager [phone]

## 2021-11-26 NOTE — Progress Notes (Signed)
[facility]- Medical Critical Care Service [facility]) Progress Note        Date / Time: 11/30/228:32 AM Room:   FI220/FI220-01  Patient:   KASHMIR, LYSAGHT   Admitted: 11/23/2021  Attending:   Austin Miles, MD   Status: Full Code     Critical Care PROGRESS Note --  Hospital Day /  LOS: 3 days         Assessment    Nareg Breighner is a 55 y.o. male with CAD s/p PCI (DES to LAD 2015), HTN, HLD, active drinker, former smoker, now s/p CABG x 4 (LIMA-dLAD, SVG- PDA, RIMA-OM1, Lrad-OM2) by Dr. Thedore Mins 11/29.    Patient Active Problem List    Diagnosis Date Noted    BMI 28.0-28.9,adult 11/24/2021    Unstable angina 11/23/2021    SOB (shortness of breath) on exertion     Stented coronary artery     NSTEMI (non-ST elevated myocardial infarction)     Coronary artery disease     Hyperlipidemia      Hospital Course   11/17: Outpatient cardiac cath revealed multivessel CAD- referred for elective CV surgery evaluation   11/27: Presents to ER with chest pain   11/29: OR, extubated    Plan     Critical Care support by organ system    NEURO: NAI  - ambulate / PT / OT    CV:  - LVEF: normal, RVEF normal  - SB at baseline (45-55); hold AVN blockers  - ASA, statin  - restart home metoprolol, lisinopril  - Lasix PRN  - cardiologist: [facility]   - stable for transfer to SDU    PULM: NAI    GI: NAI  - GI PPX: home PPI  - nutrition: diet  - mild transaminitis, stable    RENAL: NAI  - Cr 0.9    ID: NAI    HEME: NAI  - SCD's  - pharmacologic ppx: not safe secondary to POD1    ENDO/RHEUM: NAI  - glucose: goal 100 -180    Lines:  - R radial a line  - pacer wires  - Foley  - chest tubes    Code Status: Full Code    Patient is currently able to make medical decisions.      Disposition: SDU     Physical Exam   BP 143/75   Pulse 72   Temp 98.3 F (36.8 C) (Oral)   Resp (!) 24   Ht 1.778 m (5\' 10" )   Wt 95.6 kg (210 lb 12.2 oz)   SpO2 97%   BMI 30.24 kg/m    General Appearance:  alert, well appearing, and in no distress  Mental  status:  alert, oriented to person, place, and time  Neuro:  alert, oriented, normal speech, no focal findings or movement disorder noted  Lungs: clear to auscultation, no wheezes, rales or rhonchi, symmetric air entry  Cardiac: normal rate, regular rhythm, normal S1, S2, no murmurs, rubs, clicks or gallops  Abdomen:  soft, nontender, nondistended, no masses or organomegaly  Extremities: peripheral pulses normal, no pedal edema, no clubbing or cyanosis   Skin:   normal coloration and turgor, no rashes, no suspicious skin lesions noted  Other:       Data / Meds / Labs / Rads     Current Facility-Administered Medications   Medication Dose Route Frequency    acetaminophen  1,000 mg Oral 4 times per day    aspirin EC  81 mg  Oral Daily    atorvastatin  80 mg Oral QHS    cefuroxime  1.5 g Intravenous Q8H    chlorhexidine  15 mL Mouth/Throat Q12H SCH    famotidine  20 mg Oral Q12H SCH    furosemide  20 mg Intravenous Once    gabapentin  200 mg Oral Q8H SCH    lisinopril  20 mg Oral Daily    metoprolol tartrate  25 mg Oral Daily at 0400    mupirocin   Nasal Q12H [facility]    polyethylene glycol  17 g Oral Daily    senna-docusate  2 tablet Oral Q12H SCH       sodium chloride 30 mL/hr at 11/26/21 0800    niCARdipine Stopped (11/25/21 1913)    nitroglycerin      norepinephrine         Intake/Output Summary (Last 24 hours) at 11/26/2021 0832  Last data filed at 11/26/2021 0800  Gross per 24 hour   Intake 5645.61 ml   Output 2265 ml   Net 3380.61 ml     Recent Labs     11/26/21  0203 11/25/21  2000 11/25/21  0343   WBC 12.25* 8.53 6.57   Hgb 12.5 10.6* 14.5   Hematocrit 35.9* 30.4* 42.0     Recent Labs     11/26/21  0203 11/25/21  1849 11/25/21  0343   Sodium 139 139 134*   Potassium 4.6 3.8 4.2   Chloride 111 110 105   CO2 21 23 18    BUN 13.0 13.0 14.0   Creatinine 0.9 0.9 1.0   Glucose 114* 142* 95   Calcium 8.0* 8.0* 8.1*   Magnesium 2.3 3.0* 2.3     Recent Labs     11/25/21  1849 11/24/21  2008 11/23/21  1111   PT 15.7* 13.7*  --     PT INR 1.3* 1.2*  --    PTT 28 47* 30       _____________________________________________________________________    I have spent 45 minutes in the care of this patient with CABG. >50% of that time was spent in counseling and coordination of care. Any care time performed today is exclusive of teaching, billable procedures, and not overlapping with any other providers.     Signed by: August Luz, MD  11/26/2021 8:32 AM  JY:[id number], Oliver Barre, DO

## 2021-11-26 NOTE — OT Eval Note (Signed)
Occupational Therapy Eval Raymond Cook        Post Acute Care Therapy Recommendations:     Discharge Recommendations:  Home with supervision    DME needs IF patient is discharging home: Shower chair    Therapy discharge recommendations may change with patient status.  Please refer to most recent note for up-to-date recommendations.      Assessment:   Significant Findings: none    Raymond Cook is a 55 y.o. male admitted 11/23/2021 s/p CABG x 4.  Patient presents with decreased ADLs, deconditioning and decreased activity tolerance. Patient is currently min A x 2 with sit-stand transfers, min A x 2 with taking steps within room and max A with LB dressing. Will benefit from inpatient OT services to maximize functional independence required to return to daily routine.      Therapy Diagnosis: Decreased ADLs, decreased activity tolerance and deconditioning    Rehabilitation Potential: good    Treatment Activities: Initial OT evaluation; Move in the Tube precautions and energy conservation techniques  Educated the patient to role of occupational therapy, plan of care, goals of therapy and safety with mobility and ADLs, energy conservation techniques, Move in the Tube precautions.    Plan:   OT Frequency Recommended: 3-4x/wk     Treatment/Interventions: ADLs within "move in the tube" precautions, functional mobility, functional transfers, energy conservation techniques    Risks/benefits/POC discussed: yes      [facility]  Bed: FI258/FI258-01        Precautions and Contraindications:   Falls, Move in the Tube precautions     Consult received for Raymond Cook for OT Evaluation and Treatment.  Patient's medical condition is appropriate for Occupational Therapy intervention at this time.      History of Present Illness:    Raymond Cook is a 55 y.o. male admitted on 11/23/2021 with CABG x 4.     Admitting Diagnosis: Unstable angina [I20.0]    Past Medical/Surgical  History:  Past Medical History:   Diagnosis Date    Coronary artery disease     Hyperlipidemia     NSTEMI (non-ST elevated myocardial infarction)     SOB (shortness of breath) on exertion     Stented coronary artery        Imaging/Tests/Labs:  XR CHEST 2 VIEWS    Result Date: 11/25/2021  No acute cardiopulmonary process. Geanie Cooley, MD  11/25/2021 8:12 AM    X-ray chest AP portable (daily)    Result Date: 11/26/2021  Mildly increased basal atelectasis. Clide Cliff, MD  11/26/2021 8:16 AM    X-ray chest AP portable (once)    Result Date: 11/25/2021   Interval changes related to cardiac surgery with support apparatus positioned as described above. Mosetta Putt, MD  11/25/2021 9:10 PM    XR Chest  AP Portable    Result Date: 11/23/2021  No acute thoracic abnormality. Nonda Lou, MD  11/23/2021 10:47 AM    US Carotid Duplex Dopp Comp Bilateral    Result Date: 11/24/2021  1. Mild irregular heterogeneous plaque in the right carotid bulb/proximal ICA, resulting in a less than 50% stenosis. 2. Moderate irregular calcified plaque in the left carotid bulb/proximal ICA, resulting in a less than 50% stenosis. Sylvester Harder, MD  11/24/2021 8:09 PM    US Venous Duplex Doppler Leg Bilateral Limited    Result Date: 11/24/2021   Patent bilateral great and small saphenous veins, with vein mapping performed. Sylvester Harder, MD  11/24/2021 8:10 PM      Social History:   Prior Level of Function: Independent with ADLs/IADLs; (+) Conservation officer, historic buildings: none  Baseline Activity: as above  DME Currently at Home: as above  Home Living Arrangements: live with wife  Type of Home: 2STH  Home Layout: steps    Subjective: "I am ok"    Patient is agreeable to participation in the therapy session. Nursing clears patient for therapy.     Patient Goal: To go home  Pain:   Scale: 6/10; incision site; alleviated with rest and repositioning     Objective:   Patient is seated in a bedside chair with telemetry, CT to suction, foley, external pacer  and A line in place.  Pt wore mask during therapy session:No      Cognitive Status and Neuro Exam:  A/O x 4; answers questions appropriately.     Musculoskeletal Examination  RUE ROM: WFL  LUE ROM: WFL  RLE ROM: WFL  LLE ROM: WFL    RUE Strength: WFL  LUE Strength: WFL  RLE Strength: WFL  LLE Strength: WFL      Sensory/Oculomotor Examination  Auditory: intact  Tactile: intact  Vision: intact      Activities of Daily Living  Eating: Independent  Grooming: Supervision  Bathing: NT  UE Dressing: Max A   LE Dressing: Max A   Toileting: NT    Functional Mobility:  Supine to Sit: NT  Sit to Stand: Min A x 2  Transfers: Min A x 2    PMP Activity: Step 6 - Walks in Room      Balance  Static Sitting: WFL  Dynamic Sitting: WFL  Static Standing: Min A x 2  Dynamic Standing: Min A x 2    Participation and Activity Tolerance  Participation Effort: good  Endurance: fair    Patient left with call bell within reach, all needs met, SCDs not in place as found, fall mat in place, chair alarm n/a and all questions answered. RN notified of session outcome and patient response.       Goals:  Time For Goal Achievement: 3 visits  ADL Goals  Patient will dress upper body: Supervision, 3 visits  Mobility and Transfer Goals  Pt will perform functional transfers: Supervision, 3 visits        Executive Fucntion Goals  Pt will follow energy conservation techniques: with supervision, 3 visits  Other Goal: Patient will adhere to "move in the tube" precautions.                  PPE worn during session: procedural mask and gloves    Tech present: N/A  PPE worn by tech: N/A        Particia Lather, OTR/L   Pager [phone]         Time of treatment:   OT Received On: 11/26/21  Start Time: 0454  Stop Time: 0905  Time Calculation (min): 20 min

## 2021-11-27 ENCOUNTER — Inpatient Hospital Stay: Payer: No Typology Code available for payment source

## 2021-11-27 DIAGNOSIS — I4891 Unspecified atrial fibrillation: Secondary | ICD-10-CM

## 2021-11-27 DIAGNOSIS — R9431 Abnormal electrocardiogram [ECG] [EKG]: Secondary | ICD-10-CM

## 2021-11-27 LAB — ECG 12-LEAD
Atrial Rate: 82 {beats}/min
IHS MUSE NARRATIVE AND IMPRESSION: NORMAL
P Axis: 11 degrees
P-R Interval: 128 ms
Q-T Interval: 366 ms
QRS Duration: 80 ms
QTC Calculation (Bezet): 427 ms
R Axis: 7 degrees
T Axis: -16 degrees
Ventricular Rate: 82 {beats}/min

## 2021-11-27 LAB — CBC AND DIFFERENTIAL
Absolute NRBC: 0 10*3/uL (ref 0.00–0.00)
Basophils Absolute Automated: 0.02 10*3/uL (ref 0.00–0.08)
Basophils Automated: 0.1 %
Eosinophils Absolute Automated: 0 10*3/uL (ref 0.00–0.44)
Eosinophils Automated: 0 %
Hematocrit: 34 % — ABNORMAL LOW (ref 37.6–49.6)
Hgb: 12 g/dL — ABNORMAL LOW (ref 12.5–17.1)
Immature Granulocytes Absolute: 0.06 10*3/uL (ref 0.00–0.07)
Immature Granulocytes: 0.4 %
Lymphocytes Absolute Automated: 1.06 10*3/uL (ref 0.42–3.22)
Lymphocytes Automated: 7.7 %
MCH: 31.3 pg (ref 25.1–33.5)
MCHC: 35.3 g/dL (ref 31.5–35.8)
MCV: 88.5 fL (ref 78.0–96.0)
MPV: 10.9 fL (ref 8.9–12.5)
Monocytes Absolute Automated: 1.57 10*3/uL — ABNORMAL HIGH (ref 0.21–0.85)
Monocytes: 11.4 %
Neutrophils Absolute: 11.01 10*3/uL — ABNORMAL HIGH (ref 1.10–6.33)
Neutrophils: 80.4 %
Nucleated RBC: 0 /100 WBC (ref 0.0–0.0)
Platelets: 154 10*3/uL (ref 142–346)
RBC: 3.84 10*6/uL — ABNORMAL LOW (ref 4.20–5.90)
RDW: 12 % (ref 11–15)
WBC: 13.72 10*3/uL — ABNORMAL HIGH (ref 3.10–9.50)

## 2021-11-27 LAB — HEPATIC FUNCTION PANEL
ALT: 53 U/L (ref 0–55)
AST (SGOT): 48 U/L — ABNORMAL HIGH (ref 5–41)
Albumin/Globulin Ratio: 1 (ref 0.9–2.2)
Albumin: 3 g/dL — ABNORMAL LOW (ref 3.5–5.0)
Alkaline Phosphatase: 37 U/L (ref 37–117)
Bilirubin Direct: 0.4 mg/dL (ref 0.0–0.5)
Bilirubin Indirect: 0.3 mg/dL (ref 0.2–1.0)
Bilirubin, Total: 0.7 mg/dL (ref 0.2–1.2)
Globulin: 2.9 g/dL (ref 2.0–3.6)
Protein, Total: 5.9 g/dL — ABNORMAL LOW (ref 6.0–8.3)

## 2021-11-27 LAB — GLUCOSE WHOLE BLOOD - POCT
Whole Blood Glucose POCT: 121 mg/dL — ABNORMAL HIGH (ref 70–100)
Whole Blood Glucose POCT: 123 mg/dL — ABNORMAL HIGH (ref 70–100)
Whole Blood Glucose POCT: 135 mg/dL — ABNORMAL HIGH (ref 70–100)
Whole Blood Glucose POCT: 116 mg/dL — ABNORMAL HIGH (ref 70–100)

## 2021-11-27 LAB — BASIC METABOLIC PANEL
Anion Gap: 8 (ref 5.0–15.0)
BUN: 16 mg/dL (ref 9.0–28.0)
CO2: 24 mEq/L (ref 17–29)
Calcium: 8.5 mg/dL (ref 8.5–10.5)
Chloride: 100 mEq/L (ref 99–111)
Creatinine: 0.9 mg/dL (ref 0.5–1.5)
Glucose: 121 mg/dL — ABNORMAL HIGH (ref 70–100)
Potassium: 4 mEq/L (ref 3.5–5.3)
Sodium: 132 mEq/L — ABNORMAL LOW (ref 135–145)

## 2021-11-27 LAB — GFR: EGFR: >60

## 2021-11-27 LAB — MAGNESIUM: Magnesium: 2 mg/dL (ref 1.6–2.6)

## 2021-11-27 MED ORDER — FUROSEMIDE 10 MG/ML IJ SOLN
20.0000 mg | Freq: Once | INTRAMUSCULAR | Status: AC
Start: 2021-11-27 — End: 2021-11-27
  Administered 2021-11-27: 15:00:00 20 mg via INTRAVENOUS
  Filled 2021-11-27: qty 4

## 2021-11-27 MED ORDER — FUROSEMIDE 10 MG/ML IJ SOLN
20.0000 mg | Freq: Two times a day (BID) | INTRAMUSCULAR | Status: DC
Start: 2021-11-27 — End: 2021-11-28
  Administered 2021-11-27 – 2021-11-28 (×3): 20 mg via INTRAVENOUS
  Filled 2021-11-27 (×3): qty 4

## 2021-11-27 MED ORDER — FUROSEMIDE 10 MG/ML IJ SOLN
20.0000 mg | Freq: Two times a day (BID) | INTRAMUSCULAR | Status: DC
Start: 2021-11-27 — End: 2021-11-27

## 2021-11-27 NOTE — Progress Notes (Signed)
[facility] Progress Note  NP/PA Spectra [phone] and [phone]  Patient of Dr. Aneta Mins     Patient: Raymond Cook   MRN: [id number]   Date of Service: 11/27/2021    Case discussed. Assessment and plan as bellow  Assessment and Plan:   Unstable angina, known multi vessel CAD s/p 11/25/2021 CABG X 4 (LIMA-dLAD, SVG- PDA, RIMA-OM1, Lrad-OM2)  Patient with known MV CAD awaiting revascularization with CABG next month presented with chest pain. Patient with known CAD with DES to LAD post MI 2015, MV CAD via LHC cath at [facility] 11/13/2021 showing Multivessel CAD involving LAD, LCx, and RCA. LCx is likely culprit for  symptoms. LAD lesion is hemodynamically significant by iFR. Unable to pass  iFR catheter distal to prox RCA lesion. He was woken up at 4-AM with severe substernal angina, he took NTG and went back to sleep. He awoke again at 6 Am with another episode of substernal chest pain this time associated with n/v. He thus presented to ED.  Trop negative x1. EKG with non specc ST changes. Echo 07/2021 EF 65-70%. S/p CABG x 4 on 11/29.    -continue asa 81mg  PO daily  -continue lisinopril 20mg  PO daily  -continue metoprolol 25mg  PO daily        HLD  01/24/21 Lipid Panel: TC 104, TG 82, HDL 37, LDL 50    - Continue high intensity statin    HTN  As above    Will follow peripherally throughout remainder of admission.  Please call with any questions or concerns.  Discussed with patient and family at bedside.    Patient Active Problem List    Diagnosis Date Noted    *HUnstable angina [I20.0] 11/23/2021    HBMI 28.0-28.9,adult [Z68.28] 11/24/2021    SOB (shortness of breath) on exertion [R06.02]     HStented coronary artery [Z95.5]     NSTEMI (non-ST elevated myocardial infarction) [I21.4]     HCoronary artery disease [I25.10]     HHyperlipidemia [E78.5]        Subjective:     Reports feeling well. Endorses pain at sternum and with deep breathing. Denies cheST pressure/palpitations or SOB.      Prior Cardiac History/Testing:   11/25/2021 CABG X 4 ((LIMA-dLAD, SVG- PDA, RIMA-OM1, Lrad-OM2)    11/24/2021 US Carotid Duplex   1. Mild irregular heterogeneous plaque in the right carotid  bulb/proximal ICA, resulting in a less than 50% stenosis.  2. Moderate irregular calcified plaque in the left carotid bulb/proximal  ICA, resulting in a less than 50% stenosis.    TTE 11/24/2021  Summary    * The left ventricle is normal in size (LVEDD: 4.9 cm, LVESD: 3.0 cm).    * Left ventricular systolic function is normal with an ejection fraction by  Biplane Method of Discs of  60 %.    * Normal right ventricular systolic function.    * Atria are normal in size.    * No significant valvular abnormalities.    * Compared to the prior study on 01/19/2017, no major changes are noted.     Cath 11/13/21  1.The left main coronary artery is a large vessel, which bifurcates into   the left anterior descending artery and left circumflex artery. There are   no significant angiographic stenoses.   2. The left anterior descending artery is a large caliber vessel  that   gives off 1 small diagonal branch. There is an 80% stenosis involving  the   proximal edge of prior long mid LAD stent. There are no other significant   areas of disease i nthe LAD   3. The left circumflex artery is a large caliber vessel, which gives off 2   major obtuse marginal artery(s). There is a 95% proximal LCx stenosis at   the bifurcation of hte OM1 and remaining LCx ([place] 1:1:1)   4. The right coronary artery is a  large dominant vessel. There is a   60-70% prox/mid lesion (heavily calcific) with a 60% lesion in the mid   RCA. There is a 60% ostial RPDA stenosis.     iFR   An IL3.5 guiding catheter was advanced to LM. A 0.014" Runthrough was   advanced to the distal LAD.  An Ascist Navus catheter was an advanced but   could not enter the previously placed stent. Resting iFR = 0.88 which is   hemodynamically signfiicant.     We attempted iFR of the prox/mid  RCA but again Navus iFR catheter could   not pass likely due to high-grade eccentric lesion.     CONCLUSION:   1. Multivessel CAD involving LAD, LCx, and RCA. LCx is likely culprit for   symptoms. LAD lesion is hemodynamically significant by iFR. Unable to pass   iFR catheter distal to prox RCA lesion         Echo 08/19/21  1. Normal left ventricular systolic function.  2. Estimated ejection fraction 65-70%.  3. Mild aortic root dilation 3.8 cm.  4. Trace to mild mitral regurgitation.  5. Trace to mild tricuspid regurgitation.      Physical Exam:     Vitals:    11/27/21 0804   BP: 105/55   Pulse:    Resp:    Temp:    SpO2:      Temp (24hrs), Avg:98.6 F (37 C), Min:98.2 F (36.8 C), Max:98.8 F (37.1 C)    Weight: 98.2 kg (216 lb 7.9 oz)    Intake and Output Summary (Last 24 hours) at Date Time    Intake/Output Summary (Last 24 hours) at 11/27/2021 0945  Last data filed at 11/27/2021 0600  Gross per 24 hour   Intake 2210 ml   Output 935 ml   Net 1275 ml       General:  Well-nourished, well-developed.  Alert and in no apparent distress.  Eyes:  Anicteric sclera.  Pupils equal and round.    ENT:  Hearing grossly intact. Lips moist, color appropriate for race.  Neck:  2+ carotids bilaterally with normal upstroke; no carotid bruits.  No JVD.  No LAD.  Lungs:  Clear to auscultation bilaterally. No wheezes or crackles.  Respiratory effort unlabored, chest expansion symmetric.  Cardiovascular:  PMI non-displaced.  No tenderness to palpation.  Regular rate and rhythm. Normal S1 and S2.  No S3 or S4.  No murmurs or rubs.  Abdomen:  Soft, non-tender, non-distended.  No organomegaly.  No pulsatile masses.  Extremities:  2+ bilateral radial and pedal pulses.  No edema.  Skin:  Warm, dry, and intact. No rashes or lesions on exposed portions of skin.  Neurologic:  Moving all four extremities; exam grossly non-focal    Medications:         Current Facility-Administered Medications   Medication Dose Route Frequency    acetaminophen   1,000 mg Oral 4 times per day    aspirin EC  81 mg Oral Daily    atorvastatin  80 mg Oral  QHS    famotidine  20 mg Oral Q12H SCH    gabapentin  300 mg Oral Q8H SCH    insulin lispro  1-3 Units Subcutaneous QHS    insulin lispro  1-5 Units Subcutaneous TID AC    lisinopril  20 mg Oral Daily    metoprolol tartrate  25 mg Oral Daily at 0400    mupirocin   Nasal Q12H SCH    polyethylene glycol  17 g Oral Daily    senna-docusate  2 tablet Oral Q12H [facility]    tamsulosin  0.4 mg Oral Daily after dinner       Labs:     Recent CMP   Recent Labs     11/27/21  0344   Glucose 121*   BUN 16.0   Creatinine 0.9   Sodium 132*   Potassium 4.0   CO2 24   Calcium 8.5   Albumin 3.0*   AST (SGOT) 48*   ALT 53   Globulin 2.9     Recent CARDIAC ENZYMES No results for input(s): TROPONIN, ISTATTROPONI, CK in the last 24 hours.    Invalid input(s): TROPONINT, CKMB[24  Recent TSH Invalid input(s): TSH[24  Recent PT/PTT No results for input(s): INR, PTT in the last 24 hours.    Invalid input(s): PTI, COUM, COUMP, ACOAG, ACOAP[24    Recent CBC WITH DIFF   Recent Labs     11/27/21  0344   RBC 3.84*   Hgb 12.0*   Hematocrit 34.0*   MCV 88.5   MCHC 35.3   RDW 12   MPV 10.9   Platelets 154     Recent LIPID PANEL No results for input(s): CHOL, TRIG, HDL in the last 24 hours.    Invalid input(s): LDLC, VLDLC, LRAT[24    Rads:     Radiology Results (24 Hour)       Procedure Component Value Units Date/Time    X-ray chest AP portable (daily) [[id number]] Collected: 11/27/21 0833    Order Status: Completed Updated: 11/27/21 0839    Narrative:      HISTORY: Post cardiac surgery     COMPARISON: 11/26/2021    FINDINGS:   LINES AND TUBES: Unchanged median sternotomy wires and mediastinal  surgical clips of. Unchanged mediastinal drain and 2 left chest tubes.    CARDIAC SILHOUETTE: Stable cardiac silhouette size. Mildly tortuous  thoracic aorta.    LUNGS/PLEURA: Possible new trace left pleural effusion. Stable bibasilar  atelectasis. No consolidation, edema, or  pneumothorax.      Impression:        1. Possible new trace left pleural effusion.  2. Stable bibasilar atelectasis.    Legrand Pitts, MD   11/27/2021 8:37 AM            Cardiographics:   Telemetry:  NSR 70s

## 2021-11-27 NOTE — Progress Notes (Signed)
Initial Case Management Assessment and Discharge Planning  [facility]   Patient Name: OSTEN, JANEK   Date of Birth 14-Nov-1966   Attending Physician: Austin Miles, MD   Primary Care Physician: Estella Husk, DO   Length of Stay 4   Reason for Consult / Chief Complaint Unstable angina [I20.0]         Situation   Admission DX:   1. Unstable angina    2. Angina pectoris, unspecified    3. Encounter for other preprocedural examination        A/O Status: X 3    LACE Score: 6    Patient admitted from: ER  Admission Status: inpatient    Health Care Agent: Spouse  Name: see below         Background     Advanced directive:   <no information>    Code Status:   Full Code     Residence: Multi-story home    PCP: Estella Husk, DO  Patient Contact:   [phone] (home)     [phone] (mobile)     Emergency contact:   Extended Emergency Contact Information  Primary Emergency Contact: Brooke,Janice  Address: [address]           [place], [place] [address] [name]  Home Phone: [phone]  Mobile Phone: [phone]  Relation: Spouse      ADL/IADL's: Independent  Previous Level of function: 7 Independent drives and works full time    DME: None    Pharmacy:     [id number] [place], [place] - [address]  [address] [place] [address]  Phone: [phone] Fax: [phone]      Prescription Coverage: Yes    Home Health: The patient is not currently receiving home health services.    Previous SNF/AR: na    COVID Vaccine Status: vaccinated     Date First IMM given: na  UAI on file?: No  Transport for discharge? Mode of transportation: Sales executive - Family/Friend to drive patient spouse or son in law to drive him home  Agreeable to Home Health Services post-discharge:  Yes     Assessment     BARRIERS TO DISCHARGE: na     Recommendation   D/C Plan A: Home with family    D/C Plan B: Home with home health      Grace Blight MSW  Case  Manager  [facility]  [phone]  [email or web address]

## 2021-11-27 NOTE — Plan of Care (Signed)
Problem: Safety  Goal: Patient will be free from injury during hospitalization  Outcome: Progressing  Flowsheets (Taken 11/27/2021 1610)  Patient will be free from injury during hospitalization:   Provide and maintain safe environment   Assess patient's risk for falls and implement fall prevention plan of care per policy     Problem: Pain  Goal: Pain at adequate level as identified by patient  Outcome: Progressing  Flowsheets (Taken 11/27/2021 1610)  Pain at adequate level as identified by patient:   Assess pain on admission, during daily assessment and/or before any "as needed" intervention(s)   Reassess pain within 30-60 minutes of any procedure/intervention, per Pain Assessment, Intervention, Reassessment (AIR) Cycle   Evaluate if patient comfort function goal is met   Evaluate patient's satisfaction with pain management progress     Problem: Psychosocial and Spiritual Needs  Goal: Demonstrates ability to cope with hospitalization/illness  Outcome: Progressing  Flowsheets (Taken 11/27/2021 1610)  Demonstrates ability to cope with hospitalizations/illness:   Encourage verbalization of feelings/concerns/expectations   Provide quiet environment     Problem: Post-op Phase - Cardiac Surgery  Goal: Cardiac output is adequate  Outcome: Progressing  Flowsheets (Taken 11/27/2021 1610)  Cardiac output is adequate:   Monitor/assess vital signs, hemodynamic parameters, and temperature per LIP orders   Monitor/assess I&O including chest tube every hour for first 24 hours, then every 4 hours when telemetry/step-down status

## 2021-11-27 NOTE — Progress Notes (Addendum)
Cardiac Surgery Progress Note  Date/Time:  11/27/21 2:09 PM   Patient Name:  Raymond Cook Age: 55 y.o.   Room:  FI258/FI258-01     Operative Profile:   POD#: 2    11/29:   CABG x 4 (LIMA-dLAD, SVG- PDA, RIMA-OM1, Lrad-OM2)             Surgeon: Austin Miles MD              From OR: Gtts: Cardene; Products: None; Rhythm: Paced over SB; Issues: none    LVEF: normal, RVEF normal  Cardiologist: Carient     Synopsis   Indication for Surgery: CAD     Preop Course: Raymond Cook is a 55 y.o. male who has been referred for CV Surgery evaluation. Patient has a history of CAD with previous PCI (DES to LAD 2015) and presented to [facility] 11/13/2021 for a cath due to recurrent chest pain.  He was found to have progression of CAD and was referred for an elective CV Surgery evaluation. Patient presented to [facility] 11/23/2021 with acute chest pain. Cardiac enzymes were negative and he ruled in for ACS.  His past medical history is significant for Hypertension and EtoH abuse, Remote Tobacco Abuse (1ppd x 10 years quit 1996).     PMH:  has a past medical history of Coronary artery disease, Hyperlipidemia, NSTEMI (non-ST elevated myocardial infarction), SOB (shortness of breath) on exertion, and Stented coronary artery.    Daily Summary (see running discharge summary for prior events)  11/17: Outpatient cardiac cath revealed multivessel CAD- referred for elective CV surgery evaluation   11/27: Presents to ER with chest pain   11/29: OR, extubated  11/30: Transferred to SDU, Urinary retention, had straight cath  12/01: Urinary retention again with >591mL on scan. Foley reinserted. Moderate dump from CT's on ambulation >263mL/shift    Plan:   Continued SDU care, Urinary retention and Recovery from CABG        - Encourage ambulation  -  Monitor heart rate, SB at baseline (45-55). Hold AVN blockers  Gentle diuresis, Lasix 20mg  IV x 1, with good response will  make BID.   Still 10Kg positive  Continue diuresis Lasix 20mg  IV bid             System Assessment of Active Issues       Neurological?:  Pain management: Pain is well controlled on standard regimen     Cardiovascular:  Unstable angina, CAD s/p CABG   HTN   HLD     Pulmonary:  No active issues     Gastrointestinal:  No acute issues      Renal/GU:  Normal baseline renal function     Infectious Disease:  Clinically, no acute issue    Hematological:  Acute blood loss anemia post op- stable    Endocrinological:  stress hyperglycemia controlled on insulin gtt per protocol  HgbA1C: 5.6  Recent Labs     11/24/21  2008   Hemoglobin A1C 5.6         Subjective:   Shortness of breath    Physical Exam:   BP 111/58    Pulse 82    Temp 98.8 F (37.1 C) (Oral)    Resp 18    Ht 1.778 m (5\' 10" )    Wt 98.2 kg (216 lb 7.9 oz)    SpO2 93%    BMI 31.06 kg/m     Physical Exam  Neuro: Non  focal , MAE  CV: S1S2, RRR, distal pulses palpable  Pulm: + crackles, diminished,   GI; soft, NT, ND, +BS  Skin: MSI cdi      CBC:  Recent Labs     11/27/21  0344   WBC 13.72*   Hgb 12.0*   Hematocrit 34.0*   Platelets 154       BMP:  Recent Labs     11/27/21  0344   Sodium 132*   Potassium 4.0   Chloride 100   CO2 24   BUN 16.0   Creatinine 0.9   Glucose 121*   Magnesium 2.0   Calcium 8.5                  Signed by: Lolita Rieger, PA

## 2021-11-27 NOTE — Plan of Care (Addendum)
Date/Time:  11/27/21 3:49 AM   Patient Name:  Raymond Cook    Age: 55 y.o.   Room:  FI258/FI258-01     Cardiac Surgery RN Shift   Shift Events:      Neuro  Ambulate with          [x]  assist       []  independently  PT/OT Dispo recs    Home  Pain Controlled        [x]  Yes           []  No           Cardiac  BP Controlled [x]  Yes          []  No          PRN meds given []  Yes            [x]  No      Rhythm                    Sinus rhythm      Arrhythmias         []  Yes            []  No      Pacing Wires           [x]  Yes           []  No                           Pulmonary                     O2 NC                      []  Yes           [x]  No                    Chest tubes              [x]  Yes           []  No                                                                            IS                              mL    G.I.   Tolerating Diet          [x]  Yes           []  No      BM last 24 hrs          []  Yes          [x]  No               Last BM Date: 11/24/21    PPI ppx                    [x]  Yes          []  No      Renal  Foley present            []  Yes          [x]  No                                            Heme  DVT Prophylaxis            [x]  Yes           []  No                    Anticoagulation        []  Yes           [x]  No       ID   Febrile                     []  Yes          [x]  No        Endocrine   Off insulin drip         [x]  Yes           []  No      BS controlled           [x]  Yes           []  No      _____________________________________________________________________________________         Barriers to discharge:      Anticipated discharge date:  /    Priority discharge:  []  Yes            []  No        Problem: Safety  Goal: Patient will be free from injury during hospitalization  Outcome: Progressing  Goal: Patient will be free from infection during hospitalization  Outcome: Progressing     Problem: Pain  Goal: Pain at adequate level as identified by patient  Outcome:  Progressing     Problem: Side Effects from Pain Analgesia  Goal: Patient will experience minimal side effects of analgesic therapy  Outcome: Progressing     Problem: Discharge Barriers  Goal: Patient will be discharged home or other facility with appropriate resources  Outcome: Progressing     Problem: Psychosocial and Spiritual Needs  Goal: Demonstrates ability to cope with hospitalization/illness  Outcome: Progressing     Problem: Moderate/High Fall Risk Score >5  Goal: Patient will remain free of falls  Outcome: Progressing     Problem: Post-op Phase - Cardiac Surgery  Goal: Effective breathing pattern is maintained  Outcome: Progressing  Goal: Cardiac output is adequate  Outcome: Progressing  Goal: Patient will remain free from post-op complications  Outcome: Progressing  Goal: Mobility/activity is maintained at optimal level  Outcome: Progressing  Goal: Nutritional intake is adequate  Outcome: Progressing     Problem: Compromised Tissue integrity  Goal: Damaged tissue is healing and protected  Outcome: Progressing  Goal: Nutritional status is improving  Outcome: Progressing

## 2021-11-28 ENCOUNTER — Inpatient Hospital Stay: Payer: No Typology Code available for payment source

## 2021-11-28 LAB — BASIC METABOLIC PANEL
Anion Gap: 6 (ref 5.0–15.0)
Anion Gap: 11 (ref 5.0–15.0)
BUN: 17 mg/dL (ref 9.0–28.0)
BUN: 18 mg/dL (ref 9.0–28.0)
CO2: 27 mEq/L (ref 17–29)
CO2: 26 mEq/L (ref 17–29)
Calcium: 8.8 mg/dL (ref 8.5–10.5)
Calcium: 8.8 mg/dL (ref 8.5–10.5)
Chloride: 99 mEq/L (ref 99–111)
Chloride: 99 mEq/L (ref 99–111)
Creatinine: 0.8 mg/dL (ref 0.5–1.5)
Creatinine: 1 mg/dL (ref 0.5–1.5)
Glucose: 107 mg/dL — ABNORMAL HIGH (ref 70–100)
Glucose: 121 mg/dL — ABNORMAL HIGH (ref 70–100)
Potassium: 4 mEq/L (ref 3.5–5.3)
Potassium: 4.4 mEq/L (ref 3.5–5.3)
Sodium: 132 mEq/L — ABNORMAL LOW (ref 135–145)
Sodium: 136 mEq/L (ref 135–145)

## 2021-11-28 LAB — BEDSIDE SPIROMETRY WITHOUT BRONCHODILATOR
FEF Max (Pre-Bronch) Actual: 11.59 L/sec
FEF Max (Pre-Bronch) Pred: 9.56 L/sec
FEF2575 (Lower Limit of Normal): 1.69 L/sec
FEF2575 (Standard Deviation): 1.13 L/sec
FEFMax (Lower Limit of Normal): 7.23 L/sec
FEFMax (Standard Deviation): 1.41 L/sec
FEV1 (Pre-Bronch) Pred: 3.77 L
FEV1 (Standard Deviation): 0.52 L
FVC (Pre-Bronch) %Pred: 103 %
FVC (Standard Deviation): 0.67 L

## 2021-11-28 LAB — CBC AND DIFFERENTIAL
Absolute NRBC: 0 10*3/uL (ref 0.00–0.00)
Basophils Absolute Automated: 0.03 10*3/uL (ref 0.00–0.08)
Basophils Automated: 0.3 %
Eosinophils Absolute Automated: 0.18 10*3/uL (ref 0.00–0.44)
Eosinophils Automated: 1.6 %
Hematocrit: 34.6 % — ABNORMAL LOW (ref 37.6–49.6)
Hgb: 12.3 g/dL — ABNORMAL LOW (ref 12.5–17.1)
Immature Granulocytes Absolute: 0.04 10*3/uL (ref 0.00–0.07)
Immature Granulocytes: 0.4 %
Lymphocytes Absolute Automated: 1.63 10*3/uL (ref 0.42–3.22)
Lymphocytes Automated: 14.5 %
MCH: 31.1 pg (ref 25.1–33.5)
MCHC: 35.5 g/dL (ref 31.5–35.8)
MCV: 87.4 fL (ref 78.0–96.0)
MPV: 10.8 fL (ref 8.9–12.5)
Monocytes Absolute Automated: 1.54 10*3/uL — ABNORMAL HIGH (ref 0.21–0.85)
Monocytes: 13.7 %
Neutrophils Absolute: 7.84 10*3/uL — ABNORMAL HIGH (ref 1.10–6.33)
Neutrophils: 69.5 %
Nucleated RBC: 0 /100 WBC (ref 0.0–0.0)
Platelets: 168 10*3/uL (ref 142–346)
RBC: 3.96 10*6/uL — ABNORMAL LOW (ref 4.20–5.90)
RDW: 12 % (ref 11–15)
WBC: 11.26 10*3/uL — ABNORMAL HIGH (ref 3.10–9.50)

## 2021-11-28 LAB — GFR
EGFR: >60
EGFR: >60

## 2021-11-28 LAB — MAGNESIUM: Magnesium: 2 mg/dL (ref 1.6–2.6)

## 2021-11-28 LAB — GLUCOSE WHOLE BLOOD - POCT
Whole Blood Glucose POCT: 102 mg/dL — ABNORMAL HIGH (ref 70–100)
Whole Blood Glucose POCT: 103 mg/dL — ABNORMAL HIGH (ref 70–100)
Whole Blood Glucose POCT: 122 mg/dL — ABNORMAL HIGH (ref 70–100)
Whole Blood Glucose POCT: 134 mg/dL — ABNORMAL HIGH (ref 70–100)

## 2021-11-28 MED ORDER — HEPARIN SODIUM (PORCINE) 5000 UNIT/ML IJ SOLN
5000.0000 [IU] | Freq: Three times a day (TID) | INTRAMUSCULAR | Status: DC
Start: 2021-11-28 — End: 2021-12-02
  Administered 2021-11-28 – 2021-12-02 (×13): 5000 [IU] via SUBCUTANEOUS
  Filled 2021-11-28 (×13): qty 1

## 2021-11-28 MED ORDER — POTASSIUM CHLORIDE 10 MEQ/100ML IV SOLN
10.0000 meq | INTRAVENOUS | Status: DC | PRN
Start: 2021-11-28 — End: 2021-12-02

## 2021-11-28 MED ORDER — METOPROLOL TARTRATE 25 MG PO TABS
37.5000 mg | ORAL_TABLET | Freq: Two times a day (BID) | ORAL | Status: DC
Start: 2021-11-28 — End: 2021-11-29
  Administered 2021-11-28: 37.5 mg via ORAL
  Filled 2021-11-28: qty 2

## 2021-11-28 MED ORDER — POTASSIUM & SODIUM PHOSPHATES 280-160-250 MG PO PACK
2.0000 | PACK | ORAL | Status: DC | PRN
Start: 2021-11-28 — End: 2021-12-02

## 2021-11-28 MED ORDER — FUROSEMIDE 10 MG/ML IJ SOLN
20.0000 mg | Freq: Every day | INTRAMUSCULAR | Status: DC
Start: 2021-11-29 — End: 2021-11-29
  Administered 2021-11-29: 20 mg via INTRAVENOUS
  Filled 2021-11-28: qty 4

## 2021-11-28 MED ORDER — POTASSIUM CHLORIDE CRYS ER 20 MEQ PO TBCR
0.0000 meq | EXTENDED_RELEASE_TABLET | ORAL | Status: DC | PRN
Start: 2021-11-28 — End: 2021-12-02

## 2021-11-28 MED ORDER — ALBUMIN HUMAN/BIOSIMILIAR 5% IV SOLN (WRAP)
12.5000 g | Freq: Once | INTRAVENOUS | Status: AC
Start: 2021-11-28 — End: 2021-11-28
  Administered 2021-11-28: 12.5 g via INTRAVENOUS
  Filled 2021-11-28: qty 250

## 2021-11-28 MED ORDER — MAGNESIUM OXIDE 400 MG TABS (WRAP)
0.0000 mg | ORAL_TABLET | ORAL | Status: DC | PRN
Start: 2021-11-28 — End: 2021-12-02

## 2021-11-28 MED ORDER — METOPROLOL TARTRATE 25 MG PO TABS
12.5000 mg | ORAL_TABLET | Freq: Once | ORAL | Status: AC
Start: 2021-11-28 — End: 2021-11-28
  Administered 2021-11-28: 12.5 mg via ORAL
  Filled 2021-11-28: qty 1

## 2021-11-28 MED ORDER — MAGNESIUM SULFATE IN D5W 1-5 GM/100ML-% IV SOLN
1.0000 g | INTRAVENOUS | Status: DC | PRN
Start: 2021-11-28 — End: 2021-12-02

## 2021-11-28 NOTE — Plan of Care (Signed)
Date/Time:  11/28/21 6:50 AM   Patient Name:  Raymond Cook    Age: 55 y.o.   Room:  FI258/FI258-01     Cardiac Surgery RN Shift   Shift Events:   -     Neuro  Ambulate with          [x]  assist       []  Independently ambulated 1 lap around this shift  PT/OT Dispo recs    Home  Pain Controlled        [x]  Yes           []  No           Cardiac  BP Controlled [x]  Yes          []  No          PRN meds given [x]  Yes            []  No      Rhythm                    Sinus tachycardia -ST elevation noted on tele      Arrhythmias         []  Yes            [x]  No      Pacing Wires           [x]  Yes           []  No                           Pulmonary                     O2 NC                      []  Yes           [x]  No                    Chest tubes              [x]  Yes           []  No                                                                            IS                              1200 mL    G.I.   Tolerating Diet          [x]  Yes           []  No      BM last 24 hrs          []  Yes          [x]  No               Last BM Date:  (preop)    PPI ppx                    [x]  Yes          []   No      Renal                Foley present            [x]  Yes          []  No                                            Heme  DVT Prophylaxis            []  Yes           [x]  No                    Anticoagulation        []  Yes           [x]  No       ID   Febrile                     []  Yes          [x]  No        Endocrine   Off insulin drip         [x]  Yes           []  No      BS controlled           [x]  Yes           []  No      _____________________________________________________________________________________         Barriers to discharge:  CTs  Wires  BM  Anticipated discharge date:      Priority discharge:  []  Yes            [x]  No              Problem: Safety  Goal: Patient will be free from injury during hospitalization  Outcome: Progressing  Goal: Patient will be free from infection during hospitalization  Outcome:  Progressing     Problem: Pain  Goal: Pain at adequate level as identified by patient  Outcome: Progressing     Problem: Side Effects from Pain Analgesia  Goal: Patient will experience minimal side effects of analgesic therapy  Outcome: Progressing     Problem: Discharge Barriers  Goal: Patient will be discharged home or other facility with appropriate resources  Outcome: Progressing     Problem: Moderate/High Fall Risk Score >5  Goal: Patient will remain free of falls  Outcome: Progressing     Problem: Pre-op Phase - Cardiac Surgery  Goal: Patient will be ready for surgery  Outcome: Progressing     Problem: Post-op Phase - Cardiac Surgery  Goal: Effective breathing pattern is maintained  Outcome: Progressing  Goal: Cardiac output is adequate  Outcome: Progressing  Goal: Patient will remain free from post-op complications  Outcome: Progressing  Goal: Mobility/activity is maintained at optimal level  Outcome: Progressing  Goal: Nutritional intake is adequate  Outcome: Progressing     Problem: Compromised Tissue integrity  Goal: Damaged tissue is healing and protected  Outcome: Progressing  Goal: Nutritional status is improving  Outcome: Progressing

## 2021-11-28 NOTE — PT Progress Note (Signed)
Physical Therapy Treatment  Raymond Cook  Post Acute Care Therapy Recommendations:     Discharge Recommendations:  Home with supervision  D/C Milestones: stair training; Anticipate achievement in 1 sessions    DME needs IF patient is discharging home: Patient already has needed equipment    Therapy discharge recommendations may change with patient status.  Please refer to most recent note for up-to-date recommendations.      Assessment:   Significant Findings: none    Pt received seated in bedside chair. Nurse and pt agreeable to skilled PT. During the PT session, pt educated on move in the tube sternal precautions. Pt completed sit <> stand with supervision. He ambulated 36ft using no AD with SBA. Pt educated on pacing and energy conservation. At end of PT session, pt seated in bedside chair and reviewed TherEx. Pt was further educated on the importance of mobility while in the hospital to prevent functional decline. Pt would continue to benefit from skilled PT to maximize functional mobility and independence.     Assessment: Decreased UE strength, Decreased LE strength, Decreased safety/judgement during functional mobility, Decreased functional mobility, Decreased balance, Gait impairment, Decreased endurance/activity tolerance  Progress: Progressing toward goals  Prognosis: Good  Risks/Benefits/POC Discussed with Pt/Family: With patient  Patient left without needs and call bell within reach, SCDs off, fall mat in place, bed alarm n/a, chair alarm on and all questions answered. RN notified of session outcome.     Treatment Activities: Endurance training, gait training.    Educated the patient to role of physical therapy, plan of care, goals of therapy and safety with mobility and ADLs, energy conservation techniques, sternal precautions.    Plan:   Treatment/Interventions: Exercise, Gait training, Neuromuscular re-education, Functional transfer training, LE strengthening/ROM, Endurance training,  Patient/family training, Equipment eval/education, Bed mobility, Continued evaluation        PT Frequency: 3-4x/wk     Continue plan of care.    [facility]  Bed: FI258/FI258-01     Precautions and Contraindications:   Precautions  Other Precautions: move in the tube sternal precautions; fall risk    Updated Medical Status/Imaging/Labs:   Reviewed previous imaging prior to PT session.     Lab Results   Component Value Date/Time    HGB 12.3 (L) 11/28/2021 05:06 AM    HCT 34.6 (L) 11/28/2021 05:06 AM    K 4.0 11/28/2021 05:06 AM    NA 132 (L) 11/28/2021 05:06 AM     Subjective:   "I feel fine."    Patient Goal: to walk    Pain Assessment  Pain Assessment: Numeric Scale (0-10)  Pain Score: 2-mild pain  POSS Score: Awake and Alert  Pain Location: Chest  Pain Frequency: Constant/continuous;Increases with movement  Pain Intervention(s): Rest;Repositioned    Patient's medical condition is appropriate for Physical Therapy intervention at this time.  Patient is agreeable to participation in the therapy session. Nursing clears patient for therapy.    Objective:   Observation of Patient/Vital Signs:  Patient is seated in a bedside chair with telemetry and peripheral IV, foley, chest tube in place.  Pt wore mask during therapy session: worn in hallway    Cognition/Neuro Status  Arousal/Alertness: Appropriate responses to stimuli  Attention Span: Appears intact  Orientation Level: Oriented X4  Memory: Decreased recall of precautions  Following Commands: Follows multistep commands consistently  Safety Awareness: minimal verbal instruction  Insights: Educated in safety awareness;Decreased awareness of deficits  Problem Solving: Assistance required  to identify errors made;minimal assistance  Motor Planning: intact  Coordination: intact  Orientation Level: Oriented X4    Functional Mobility:  Sit to Stand: Supervision  Stand to Sit: Supervision       Ambulation:  PMP - Progressive Mobility Protocol   PMP  Activity: Step 7 - Walks out of Room  Distance Walked (ft) (Step 6,7): 300 Feet    Ambulation: Stand by Assist (336ft using no AD)  Pattern: decreased cadence;decreased step length  Therapeutic Exercise  Strengthening: patient education (reviewed seated TherEx)  Neuro Re-Ed  Sitting Balance: supervision  Standing Balance: stand by assist       Patient Participation: good  Patient Endurance: good-    Goals:  Goals  Goal Formulation: With patient  Time for Goal Acheivement: 5 visits  Goals: Select goal  Pt Will Go Supine To Sit: with supervision, to maximize functional mobility and independence  Pt Will Perform Sit To Supine: with supervision, to maximize functional mobility and independence  Pt Will Stand: with supervision, to maximize functional mobility and independence  Pt Will Ambulate: > 200 feet, with rolling walker, with supervision, to maximize functional mobility and independence  Pt Will Go Up / Down Stairs: 1 flight, with supervision, With rail, to maximize functional mobility and independence    Time of Treatment  PT Received On: 11/28/21  Start Time: 1150  Stop Time: 1214  Time Calculation (min): 24 min    PPE worn during session: procedural mask and gloves  PPE worn by tech: N/A    Terie Purser  DPT, PT  [phone]

## 2021-11-28 NOTE — OT Progress Note (Signed)
Occupational Therapy Treatment  Raymond Cook        Post Acute Care Therapy Recommendations:     Discharge Recommendations:  Home with supervision    DME needs IF patient is discharging home: Shower chair    Therapy discharge recommendations may change with patient status.  Please refer to most recent note for up-to-date recommendations.    Assessment:   Significant Findings: none    Patient tolerated OT session well today; patient has met OT goals. Patient demonstrated increased activity tolerance and endurance while completing functional tasks. Patient is currently supervision with ADLs, functional mobility and functional transfers. Will d/c from OT services; please reconsult if any changes in functional status.      Treatment Activities: ADLs within "move in the tube" precautions, functional mobility, functional transfers, energy conservation techniques    Educated the patient to role of occupational therapy, plan of care, goals of therapy and safety with mobility and ADLs, energy conservation techniques, discharge instructions, home safety, Move in the Tube precautions.    Plan:    OT Frequency Recommended: therapy discontinued     Discharge from OT Acute Care Services.    [facility]  Bed: FI258/FI258-01       Precautions and Contraindications:   Falls, Move in the Tube precautions    Updated Medical Status/Imaging/Labs:  X-ray chest AP portable (daily)    Result Date: 11/28/2021   1.  Tiny left apical pneumothorax is noted with left-sided chest tubes in place. 2.   No new or worsening infiltrates are appreciated. 3.   Bibasilar atelectasis is unchanged. 4.   Urgent findings were discussed with, and read back by, NP Algie Coffer at the following time: 11/28/2021 7:49 AM. Miguel Dibble, MD  11/28/2021 7:51 AM      Subjective: "I am feeling ok"   Patient's medical condition is appropriate for Occupational Therapy intervention at this time.  Patient is agreeable to participation in the  therapy session. Nursing clears patient for therapy.    Pain:   Scale: 0/10    Objective:   Patient is in bed with telemetry, 2 CT to suction, foley and external pacer in place.  Pt wore mask during therapy session:Yes    Cognition  A/O x 4; answers questions appropriately.     Functional Mobility  Supine to Sit: Supervision  Sit to Stand: Supervision  Transfers: Supervision  Functional mobility: Supervision with household mobility    PMP Activity: Step 7 - Walks out of Room    Balance  Static Sitting: WFL  Dynamic Sitting: WFL  Static Standing: Supervision  Dynamic Standing: Supervision    Self Care and Home Management  Eating: Independent  Grooming: Supervision  Bathing: Educated on bathing techniques while adhering to Move in the Tube precautions   UE Dressing: Supervision  LE Dressing: Supervision  Toileting: Supervision    Therapeutic Exercises  Exercise with activity    Participation: good  Endurance: fair    Patient left with call bell within reach, all needs met, SCDs not in place as found, fall mat in place, chair alarm in place and all questions answered. RN notified of session outcome and patient response.     Goals:  Time For Goal Achievement: 3 visits  ADL Goals  Patient will dress upper body: Goal met  Mobility and Transfer Goals  Pt will perform functional transfers: Goal met        Executive Fucntion Goals  Pt will follow energy  conservation techniques: Goal met  Other Goal:  (Goal met)                  PPE worn during session: procedural mask and gloves    Tech present: N/A  PPE worn by tech: N/A    Particia Lather, OTR/L   Pager [phone]      Time of Treatment  OT Received On: 11/28/21  Start Time: 1125  Stop Time: 1150  Time Calculation (min): 25 min    Treatment # 1 of 3 visits

## 2021-11-28 NOTE — Progress Notes (Signed)
Cardiac Surgery Progress Note  Date/Time:  11/28/21 4:57 PM   Patient Name:  Raymond Cook Age: 55 y.o.   Room:  FI258/FI258-01     Operative Profile:   POD#: 3    11/29:   CABG x 4 (LIMA-dLAD, SVG- PDA, RIMA-OM1, Lrad-OM2)             Surgeon: Austin Miles MD              From OR: Gtts: Cardene; Products: None; Rhythm: Paced over SB; Issues: none    LVEF: normal, RVEF normal  Cardiologist: Carient     Synopsis   Indication for Surgery: CAD     Preop Course: Raymond Cook is a 55 y.o. male who has been referred for CV Surgery evaluation. Patient has a history of CAD with previous PCI (DES to LAD 2015) and presented to [facility] 11/13/2021 for a cath due to recurrent chest pain.  He was found to have progression of CAD and was referred for an elective CV Surgery evaluation. Patient presented to [facility] 11/23/2021 with acute chest pain. Cardiac enzymes were negative and he ruled in for ACS.  His past medical history is significant for Hypertension and EtoH abuse, Remote Tobacco Abuse (1ppd x 10 years quit 1996).     PMH:  has a past medical history of Coronary artery disease, Hyperlipidemia, NSTEMI (non-ST elevated myocardial infarction), SOB (shortness of breath) on exertion, and Stented coronary artery.    Daily Summary (see running discharge summary for prior events)  11/17: Outpatient cardiac cath revealed multivessel CAD- referred for elective CV surgery evaluation   11/27: Presents to ER with chest pain   11/29: OR, extubated  11/30: Transferred to SDU, Urinary retention, had straight cath  12/01: Urinary retention again with >522mL on scan. Foley reinserted. Moderate dump from CT's on ambulation >218mL/shift  12/2: Tachycardic, decreased diuresis, Albumin x1 and increased metoprolol     Plan:   Continued SDU care, Urinary retention, tachycardia  Encourage ambulation  Void trial 12/3  Sinus tachycardia- fluid challenge. Increase  metoprolol as tolerated  Likely home 12/5           System Assessment of Active Issues     Neurological?:  Pain management: Pain is well controlled on standard regimen     Cardiovascular:  Unstable angina, CAD s/p CABG   On ASA/Statin and BB  HTN - well controlled   HLD     Pulmonary:  No active issues     Gastrointestinal:  No acute issues      Renal/GU:  Normal baseline renal function  Volume overload- resolved with diuresis     Infectious Disease:  Clinically, no acute issue    Hematological:  Acute blood loss anemia post op- stable    Endocrinological:  stress hyperglycemia- resolved  HgbA1C: 5.6    Subjective:   Discomfort at chest tube insertion site, otherwise no complaints    Physical Exam:   BP 108/65   Pulse (!) 111   Temp 99.2 F (37.3 C) (Oral)   Resp 17   Ht 1.778 m (5\' 10" )   Wt 94.3 kg (208 lb)   SpO2 96%   BMI 29.84 kg/m     Physical Exam  Neuro: Non focal , MAE  CV: S1S2, RRR, distal pulses palpable. Warm and well perfused  Pulm: CTA with diminished bases  GI; soft, NT, ND, +BS  Skin: MSI cdi      CBC:  Recent  Labs     11/28/21  0506   WBC 11.26*   Hgb 12.3*   Hematocrit 34.6*   Platelets 168     BMP:  Recent Labs     11/28/21  0506   Sodium 132*   Potassium 4.0   Chloride 99   CO2 27   BUN 17.0   Creatinine 0.8   Glucose 107*   Magnesium 2.0   Calcium 8.8                Signed by: Felecia Jan, NP

## 2021-11-28 NOTE — Progress Notes (Signed)
Patient's tentative discharge date is this weekend    His plan to return home continues    [facility] is following for home health nursing    Either his wife or son in law will drive him home    Grace Blight MSW  Case Manager  [facility]  [phone]  [email or web address]

## 2021-11-28 NOTE — Plan of Care (Signed)
[facility] Nursing Progress Note    Raymond Cook is a 55 y.o. male  Admitted 11/23/2021  9:36 AM [facility] day 5)    Major Shift Events:  ST HR 110s patient given albumin     Review of Systems  Neuro:  A&Ox4  PERRLA  MAE  Denies pain  Multiple 1 assist walk around unit    Cardiac:  SR/ST  BP 100s SBP goal <140  Pacer wire AV, mode VVI rate 50 MA 16 sensitivity   Chest Tube to suction    Respiratory:  Room Air  Crackles lung sounds  ISO 1000    GI/GU:  C.C. Cardiac diet  Abdomen soft and nontender active bowel sounds  Foley with adequate output   Lasix decrease to Daily     BM this shift?  no    Skin Assessment  Skin Integrity: Other(Comment) (surgical sites)    Braden Score: Braden Scale Score: 20 (11/28/21 0800)      LDAWs  Patient Lines/Drains/Airways Status       Active Lines, Drains and Airways       Name Placement date Placement time Site Days    Peripheral IV 11/25/21 18 G Right Antecubital 11/25/21  1313  Antecubital  3    Peripheral IV 11/27/21 22 G Left;Posterior Wrist 11/27/21  1525  Wrist  1    Chest Tube Anterior Mediastinal 24 Fr. 11/25/21  1726  Mediastinal  3    Chest Tube Anterior Mediastinal 24 Fr. 11/25/21  1727  Mediastinal  3    Chest Tube Right Pleural 24 Fr. 11/25/21  1727  Pleural  3    Chest Tube Left Pleural 24 Fr. 11/25/21  1727  Pleural  3    Urethral Catheter 16 Fr. 11/27/21  1100  --  1    Pacer Wires 11/25/21  1657  Ventricular  3    Pacer Wires 11/25/21  1706  Atrial  3                   Wound 11/25/21 Surgical Incision Chest Other (Comment) (Active)   Date First Assessed/Time First Assessed: 11/25/21 1525   Wound Type: Surgical Incision  Location: Chest  Wound Location Orientation: Other (Comment)      Assessments 11/25/2021  8:00 PM 11/28/2021  8:00 AM   Site Description Dressing covering site (UTA) Clean;Dry;Intact   Peri-wound Description Dressing covering site (UTA) Clean;Dry;Intact   Closure Dressing covering site (UTA) --   Margins Dressing covering site (UTA) --   Dressing  [place] Open to air       No Linked orders to display       Wound 11/25/21 Surgical Incision Arm Left (Active)   Date First Assessed/Time First Assessed: 11/25/21 1525   Wound Type: Surgical Incision  Location: Arm  Wound Location Orientation: Left      Assessments 11/25/2021  8:00 PM 11/28/2021  8:00 AM   Site Description Dressing covering site (UTA) Clean;Dry;Intact   Peri-wound Description Dressing covering site (UTA) Clean;Dry;Intact   Closure Dressing covering site (UTA) --   Margins Dressing covering site (UTA) --   Dressing Coban;Gauze Open to air   Dressing Status Clean;Dry;Intact --       No Linked orders to display       Wound 11/25/21 Surgical Incision Leg Left (Active)   Date First Assessed/Time First Assessed: 11/25/21 1525   Wound Type: Surgical Incision  Location: Leg  Wound Location Orientation: Left      Assessments  11/25/2021  8:00 PM 11/28/2021  8:00 AM   Site Description Dressing covering site (UTA) Clean;Dry;Intact   Peri-wound Description Dressing covering site (UTA) Dry;Clean;Intact   Closure Dressing covering site (UTA) --   Margins Dressing covering site (UTA) --   Dressing Coban;Gauze Open to air   Dressing Status Clean;Dry;Intact --       No Linked orders to display           Indication for Foley and estimated target removal date?  tomorrow        Problem: Safety  Goal: Patient will be free from injury during hospitalization  Outcome: Progressing  Goal: Patient will be free from infection during hospitalization  Outcome: Progressing     Problem: Pain  Goal: Pain at adequate level as identified by patient  Outcome: Progressing     Problem: Side Effects from Pain Analgesia  Goal: Patient will experience minimal side effects of analgesic therapy  Outcome: Progressing     Problem: Discharge Barriers  Goal: Patient will be discharged home or other facility with appropriate resources  Outcome: Progressing     Problem: Psychosocial and Spiritual Needs  Goal: Demonstrates ability to cope with  hospitalization/illness  Outcome: Progressing     Problem: Moderate/High Fall Risk Score >5  Goal: Patient will remain free of falls  Outcome: Progressing     Problem: Pre-op Phase - Cardiac Surgery  Goal: Patient will be ready for surgery  Outcome: Progressing     Problem: Post-op Phase - Cardiac Surgery  Goal: Effective breathing pattern is maintained  Outcome: Progressing  Goal: Cardiac output is adequate  Outcome: Progressing  Goal: Patient will remain free from post-op complications  Outcome: Progressing  Goal: Mobility/activity is maintained at optimal level  Outcome: Progressing  Goal: Nutritional intake is adequate  Outcome: Progressing     Problem: Compromised Tissue integrity  Goal: Damaged tissue is healing and protected  Outcome: Progressing  Goal: Nutritional status is improving  Outcome: Progressing

## 2021-11-29 ENCOUNTER — Inpatient Hospital Stay: Payer: No Typology Code available for payment source

## 2021-11-29 LAB — BASIC METABOLIC PANEL
Anion Gap: 8 (ref 5.0–15.0)
BUN: 18 mg/dL (ref 9.0–28.0)
CO2: 25 mEq/L (ref 17–29)
Calcium: 8.9 mg/dL (ref 8.5–10.5)
Chloride: 102 mEq/L (ref 99–111)
Creatinine: 0.9 mg/dL (ref 0.5–1.5)
Glucose: 92 mg/dL (ref 70–100)
Potassium: 4.9 mEq/L (ref 3.5–5.3)
Sodium: 135 mEq/L (ref 135–145)

## 2021-11-29 LAB — CBC AND DIFFERENTIAL
Absolute NRBC: 0 10*3/uL (ref 0.00–0.00)
Basophils Absolute Automated: 0.03 10*3/uL (ref 0.00–0.08)
Basophils Automated: 0.4 %
Eosinophils Absolute Automated: 0.27 10*3/uL (ref 0.00–0.44)
Eosinophils Automated: 3.2 %
Hematocrit: 34.5 % — ABNORMAL LOW (ref 37.6–49.6)
Hgb: 11.4 g/dL — ABNORMAL LOW (ref 12.5–17.1)
Immature Granulocytes Absolute: 0.05 10*3/uL (ref 0.00–0.07)
Immature Granulocytes: 0.6 %
Lymphocytes Absolute Automated: 1.46 10*3/uL (ref 0.42–3.22)
Lymphocytes Automated: 17.3 %
MCH: 29.8 pg (ref 25.1–33.5)
MCHC: 33 g/dL (ref 31.5–35.8)
MCV: 90.3 fL (ref 78.0–96.0)
MPV: 11 fL (ref 8.9–12.5)
Monocytes Absolute Automated: 1.33 10*3/uL — ABNORMAL HIGH (ref 0.21–0.85)
Monocytes: 15.8 %
Neutrophils Absolute: 5.3 10*3/uL (ref 1.10–6.33)
Neutrophils: 62.7 %
Nucleated RBC: 0 /100 WBC (ref 0.0–0.0)
Platelets: 229 10*3/uL (ref 142–346)
RBC: 3.82 10*6/uL — ABNORMAL LOW (ref 4.20–5.90)
RDW: 13 % (ref 11–15)
WBC: 8.44 10*3/uL (ref 3.10–9.50)

## 2021-11-29 LAB — GLUCOSE WHOLE BLOOD - POCT
Whole Blood Glucose POCT: 101 mg/dL — ABNORMAL HIGH (ref 70–100)
Whole Blood Glucose POCT: 92 mg/dL (ref 70–100)
Whole Blood Glucose POCT: 98 mg/dL (ref 70–100)
Whole Blood Glucose POCT: 95 mg/dL (ref 70–100)
Whole Blood Glucose POCT: 93 mg/dL (ref 70–100)

## 2021-11-29 LAB — MAGNESIUM: Magnesium: 2.2 mg/dL (ref 1.6–2.6)

## 2021-11-29 LAB — GFR: EGFR: >60

## 2021-11-29 MED ORDER — LISINOPRIL 10 MG PO TABS
10.0000 mg | ORAL_TABLET | Freq: Every day | ORAL | Status: DC
Start: 2021-11-29 — End: 2021-12-01
  Administered 2021-11-29 – 2021-12-01 (×3): 10 mg via ORAL
  Filled 2021-11-29 (×3): qty 1

## 2021-11-29 MED ORDER — ALBUMIN HUMAN/BIOSIMILIAR 5% IV SOLN (WRAP)
25.0000 g | Freq: Once | INTRAVENOUS | Status: AC
Start: 2021-11-29 — End: 2021-11-29
  Administered 2021-11-29: 25 g via INTRAVENOUS
  Filled 2021-11-29: qty 500

## 2021-11-29 MED ORDER — METOPROLOL TARTRATE 25 MG PO TABS
50.0000 mg | ORAL_TABLET | Freq: Two times a day (BID) | ORAL | Status: DC
Start: 2021-11-29 — End: 2021-12-01
  Administered 2021-11-29 – 2021-12-01 (×5): 50 mg via ORAL
  Filled 2021-11-29 (×5): qty 2

## 2021-11-29 NOTE — Progress Notes (Addendum)
Cardiac Surgery Progress Note  Date/Time:  11/29/21 3:19 PM   Patient Name:  Raymond Cook Age: 55 y.o.   Room:  FI258/FI258-01     Operative Profile:   POD#: 4    11/29:   CABG x 4 (LIMA-dLAD, SVG- PDA, RIMA-OM1, Lrad-OM2)             Surgeon: Austin Miles MD              From OR: Gtts: Cardene; Products: None; Rhythm: Paced over SB; Issues: none    LVEF: normal, RVEF normal  Cardiologist: Carient     Synopsis   Indication for Surgery: CAD     Preop Course: Raymond Cook is a 55 y.o. male who has been referred for CV Surgery evaluation. Patient has a history of CAD with previous PCI (DES to LAD 2015) and presented to [facility] 11/13/2021 for a cath due to recurrent chest pain.  He was found to have progression of CAD and was referred for an elective CV Surgery evaluation. Patient presented to [facility] 11/23/2021 with acute chest pain. Cardiac enzymes were negative and he ruled in for ACS.  His past medical history is significant for Hypertension and EtoH abuse, Remote Tobacco Abuse (1ppd x 10 years quit 1996).     PMH:  has a past medical history of Coronary artery disease, Hyperlipidemia, NSTEMI (non-ST elevated myocardial infarction), SOB (shortness of breath) on exertion, and Stented coronary artery.    Daily Summary (see running discharge summary for prior events)  11/17: Outpatient cardiac cath revealed multivessel CAD- referred for elective CV surgery evaluation   11/27: Presents to ER with chest pain   11/29: OR, extubated  11/30: Transferred to SDU, Urinary retention, had straight cath  12/01: Urinary retention again with >553mL on scan. Foley reinserted. Moderate dump from CT's on ambulation >282mL/shift  12/2: Tachycardic, decreased diuresis, Albumin x1 and increased metoprolol   12/3: increased Metop for HR, excessive diuresis--Albumin x 2, CTs removed, foley removed    Plan:   Remove CTs  Leave PWs  Remove foley  Sinus  tachycardia- stopped diuretics and given 500cc Albumin  Likely home 12/4 or 12/5           System Assessment of Active Issues     Neurological?:  Pain management: Pain is well controlled--Oxycodone stopped, continue scheduled Gaba 300mg  tid, Tylenol, and prn Tramadol     Cardiovascular:  Unstable angina, CAD s/p CABG   On ASA/Statin and BB  ST--likely hypovolemic--volume  replaced and increased Metoprolol  HTN - well controlled on Metoprolol and Lisinopril  HLD - Lipitor    Pulmonary:  Weaning O2  OSA--CPAP at home ordered but has not been delivered yet     Gastrointestinal:  No acute issues   +BM     Renal/GU:  Normal baseline renal function  Volume overload- increased Lasix  Retention requiring foley reinsertion, foley now out     Infectious Disease:  Clinically, no acute issue    Hematological:  Acute blood loss anemia post op- stable  Plavix when PWs out    Endocrinological:  stress hyperglycemia- resolved  HgbA1C: 5.6    Subjective:   Sitting in bed, no current complaints    Physical Exam:   BP 116/70   Pulse (!) 103   Temp 98.6 F (37 C) (Oral)   Resp 18   Ht 1.778 m (5\' 10" )   Wt 92.9 kg (204 lb 11.2 oz)   SpO2  96%   BMI 29.37 kg/m     Physical Exam  Neuro: awake and alert  CV: RRR  Pulm: Clear, diminished bases  GI; soft, non-tender, +BS  Wounds: dry and intact, sternum stable      CBC:  Recent Labs     11/29/21  0510   WBC 8.44   Hgb 11.4*   Hematocrit 34.5*   Platelets 229       BMP:  Recent Labs     11/29/21  0510   Sodium 135   Potassium 4.9   Chloride 102   CO2 25   BUN 18.0   Creatinine 0.9   Glucose 92   Magnesium 2.2   Calcium 8.9                  Signed by: Cammy Brochure, PA

## 2021-11-29 NOTE — Progress Notes (Signed)
Date/Time:  11/29/21 3:56 AM   Patient Name:  Raymond Cook    Age: 55 y.o.   Room:  FI258/FI258-01     Cardiac Surgery RN Shift   Shift Events:   - none      Neuro  Ambulate with          [x]  assist       []  independently  PT/OT Dispo recs    Home  Pain Controlled        [x]  Yes           []  No           Cardiac  BP Controlled [x]  Yes          []  No          PRN meds given []  Yes            []  No      Rhythm                    Sinus rhythm      Arrhythmias         [x]  Yes            []  No      Pacing Wires           [x]  Yes           []  No                           Pulmonary                     O2 NC                      []  Yes           [x]  No                    Chest tubes              [x]  Yes           []  No                                                                            IS                                 G.I.   Tolerating Diet          [x]  Yes           []  No      BM last 24 hrs          [x]  Yes          []  No               Last BM Date: 11/28/21    PPI ppx                    []  Yes          []  No  Renal                Foley present            [x]  Yes          []  No                                            Heme  DVT Prophylaxis            [x]  Yes           []  No                    Anticoagulation        [x]  Yes           []  No       ID   Febrile                     []  Yes          [x]  No        Endocrine   Off insulin drip         [x]  Yes           []  No      BS controlled           [x]  Yes           []  No      _____________________________________________________________________________________         Barriers to discharge:   None      Anticipated discharge date:       Priority discharge:  []  Yes            [x]  No

## 2021-11-30 DIAGNOSIS — R000 Tachycardia, unspecified: Secondary | ICD-10-CM

## 2021-11-30 LAB — GLUCOSE WHOLE BLOOD - POCT
Whole Blood Glucose POCT: 96 mg/dL (ref 70–100)
Whole Blood Glucose POCT: 100 mg/dL (ref 70–100)
Whole Blood Glucose POCT: 100 mg/dL (ref 70–100)
Whole Blood Glucose POCT: 112 mg/dL — ABNORMAL HIGH (ref 70–100)

## 2021-11-30 LAB — ECG 12-LEAD
Atrial Rate: 111 {beats}/min
P Axis: 66 degrees
P-R Interval: 176 ms
Q-T Interval: 326 ms
QRS Duration: 86 ms
QTC Calculation (Bezet): 443 ms
R Axis: 8 degrees
T Axis: -6 degrees
Ventricular Rate: 111 {beats}/min

## 2021-11-30 MED ORDER — GABAPENTIN 100 MG PO CAPS
200.0000 mg | ORAL_CAPSULE | Freq: Three times a day (TID) | ORAL | Status: DC
Start: 2021-11-30 — End: 2021-12-02
  Administered 2021-11-30 – 2021-12-02 (×6): 200 mg via ORAL
  Filled 2021-11-30 (×6): qty 2

## 2021-11-30 MED ORDER — ALPRAZOLAM 0.5 MG PO TABS
0.5000 mg | ORAL_TABLET | Freq: Three times a day (TID) | ORAL | Status: DC | PRN
Start: 2021-11-30 — End: 2021-11-30

## 2021-11-30 MED ORDER — ALPRAZOLAM 0.5 MG PO TABS
0.5000 mg | ORAL_TABLET | Freq: Three times a day (TID) | ORAL | Status: DC | PRN
Start: 2021-11-30 — End: 2021-12-02
  Administered 2021-12-01 (×2): 0.5 mg via ORAL
  Filled 2021-11-30 (×2): qty 1

## 2021-11-30 MED ORDER — ALBUMIN HUMAN/BIOSIMILIAR 5% IV SOLN (WRAP)
12.5000 g | Freq: Once | INTRAVENOUS | Status: AC
Start: 2021-11-30 — End: 2021-11-30
  Administered 2021-11-30: 12.5 g via INTRAVENOUS
  Filled 2021-11-30: qty 250

## 2021-11-30 MED ORDER — ALPRAZOLAM 0.5 MG PO TABS
0.5000 mg | ORAL_TABLET | Freq: Once | ORAL | Status: AC
Start: 2021-11-30 — End: 2021-11-30
  Administered 2021-11-30: 0.5 mg via ORAL
  Filled 2021-11-30: qty 1

## 2021-11-30 MED ORDER — AMIODARONE HCL 200 MG PO TABS
200.0000 mg | ORAL_TABLET | Freq: Two times a day (BID) | ORAL | Status: DC
Start: 2021-11-30 — End: 2021-12-02
  Administered 2021-11-30 – 2021-12-02 (×5): 200 mg via ORAL
  Filled 2021-11-30 (×5): qty 1

## 2021-11-30 NOTE — Nursing Progress Note (Addendum)
Date/Time:  11/30/21 6:46 PM   Patient Name:  Raymond Cook    Age: 55 y.o.   Room:  FI258/FI258-01     Cardiac Surgery RN Shift   Shift Events:   - albumin X1 given, 200mg  of amiodarone started, PRN xanax given    Neuro  Ambulate with          [x]  assist       []  independently  PT/OT Dispo recs    Home with home health  Pain Controlled        [x]  Yes           []  No           Cardiac  BP Controlled [x]  Yes          []  No          PRN meds given []  Yes            [x]  No      Rhythm                    Sinus tachycardia      Arrhythmias         []  Yes            [x]  No      Pacing Wires           []  Yes           [x]  No                           Pulmonary                     O2 NC                      []  Yes           [x]  No                    Chest tubes              []  Yes           [x]  No                                                                            IS                                 G.I.   Tolerating Diet          [x]  Yes           []  No      BM last 24 hrs          [x]  Yes          []  No               Last BM Date: 11/29/21    PPI ppx                    []  Yes          []   No      Renal                Foley present            []  Yes          [x]  No                                            Heme  DVT Prophylaxis            [x]  Yes           []  No                    Anticoagulation        [x]  Yes           []  No       ID   Febrile                     []  Yes          [x]  No        Endocrine   Off insulin drip         [x]  Yes           []  No      BS controlled           [x]  Yes           []  No      _____________________________________________________________________________________         Barriers to discharge:   Tachydardia    Anticipated discharge date:   TBD    Priority discharge:  []  Yes            [x]  No

## 2021-11-30 NOTE — Progress Notes (Signed)
Cardiac Surgery Progress Note  Date/Time:  11/30/21 4:11 PM   Patient Name:  Raymond Cook Age: 55 y.o.   Room:  FI258/FI258-01     Operative Profile:   POD#: 5    11/29:   CABG x 4 (LIMA-dLAD, SVG- PDA, RIMA-OM1, Lrad-OM2)             Surgeon: Austin Miles MD              From OR: Gtts: Cardene; Products: None; Rhythm: Paced over SB; Issues: none    LVEF: normal, RVEF normal  Cardiologist: Carient     Synopsis   Indication for Surgery: CAD     Preop Course: Raymond Cook is a 55 y.o. male who has been referred for CV Surgery evaluation. Patient has a history of CAD with previous PCI (DES to LAD 2015) and presented to [facility] 11/13/2021 for a cath due to recurrent chest pain.  He was found to have progression of CAD and was referred for an elective CV Surgery evaluation. Patient presented to [facility] 11/23/2021 with acute chest pain. Cardiac enzymes were negative and he ruled in for ACS.  His past medical history is significant for Hypertension and EtoH abuse, Remote Tobacco Abuse (1ppd x 10 years quit 1996).     PMH:  has a past medical history of Coronary artery disease, Hyperlipidemia, NSTEMI (non-ST elevated myocardial infarction), SOB (shortness of breath) on exertion, and Stented coronary artery.    Daily Summary (see running discharge summary for prior events)  11/17: Outpatient cardiac cath revealed multivessel CAD- referred for elective CV surgery evaluation   11/27: Presents to ER with chest pain   11/29: OR, extubated  11/30: Transferred to SDU, Urinary retention, had straight cath  12/01: Urinary retention again with >543mL on scan. Foley reinserted. Moderate dump from CT's on ambulation >295mL/shift  12/2: Tachycardic, decreased diuresis, Albumin x1 and increased metoprolol   12/3: increased Metop for HR, excessive diuresis--Albumin x 2, CTs removed, foley removed  12/4: resumed post op Amio for ST 110s, prn Xanax, Albumin x  1    Plan:   Persistent ST--resume post op Amio, continue Metoprolol 50mg  bid, and give another albumin  Anxiety--prn Xanax (HR improved after one dose)  Leave PWs as HR intermittently dropping to 70s  Robust uo with foley out and off diuretics  Likely home 12/5 if resting HR improved           System Assessment of Active Issues     Neurological?:  Pain management: Pain is well controlled--Oxycodone stopped, continue scheduled Gaba 300mg  tid, Tylenol, and prn Tramadol   Anxiety related to hospitalization--Xanax given x 1, and continue prn    Cardiovascular:  Unstable angina, CAD s/p CABG   On ASA/Statin and BB  ST improving with Metoprolol/Amiodarone/volume and Xanax  PWs left in as pt occasionally drops to 70 from 110s, and pre op was noted to be 45-55  HTN - well controlled on Metoprolol and Lisinopril  HLD - Lipitor    Pulmonary:  Stable on RA  CXR with tiny residual L ptx, otherwise clear     Gastrointestinal:  No acute issues   +BM     Renal/GU:  Normal renal function  Robust response to diuretics so off now  Retention requiring foley reinsertion, foley now out and urinating well     Infectious Disease:  Clinically, no acute issue    Hematological:  Acute blood loss anemia post op- stable  Plavix when PWs out    Endocrinological:  stress hyperglycemia- resolved  HgbA1C: 5.6    Subjective:   Sitting in bed, no current complaints    Physical Exam:   BP 107/62   Pulse 76   Temp 99.1 F (37.3 C) (Oral)   Resp 18   Ht 1.778 m (5\' 10" )   Wt 90 kg (198 lb 6.4 oz)   SpO2 97%   BMI 28.47 kg/m     Physical Exam  Neuro: awake and alert  CV: rapid HR, regular  Pulm: Clear  GI; soft, non-tender, +BS  Wounds: dry and intact, sternum stable      CBC:  Recent Labs     11/29/21  0510   WBC 8.44   Hgb 11.4*   Hematocrit 34.5*   Platelets 229       BMP:  Recent Labs     11/29/21  0510   Sodium 135   Potassium 4.9   Chloride 102   CO2 25   BUN 18.0   Creatinine 0.9   Glucose 92   Magnesium 2.2   Calcium 8.9                   Signed by: Cammy Brochure, PA

## 2021-11-30 NOTE — PT Progress Note (Signed)
Physical Therapy Treatment  Raymond Cook  Post Acute Care Therapy Recommendations:     Discharge Recommendations:  Home with supervision    DME needs IF patient is discharging home: Patient already has needed equipment    Therapy discharge recommendations may change with patient status.  Please refer to most recent note for up-to-date recommendations.      Assessment:   Significant Findings: HR in the 120s to 130s with activity     Pt has met all goals. Pt is able to ambulate around the unit with no assistive device 2 times and descend/descend at supervision level. Pt no longer requires skilled acute physical therapy services during hospital stay. Recommend D/C to home with supervision.    Assessment: Decreased LE strength, Decreased safety/judgement during functional mobility, Decreased endurance/activity tolerance, Decreased balance, Gait impairment  Progress: Improving as expected  Prognosis: Good  Risks/Benefits/POC Discussed with Pt/Family: With patient  Patient left without needs and call bell within reach. RN notified of session outcome.     Treatment Activities: transfer training, gait training, stair training, pt education and discharge planning     Educated the patient to role of physical therapy, plan of care, goals of therapy and HEP, safety with mobility and ADLs, energy conservation techniques.    Plan:   Treatment/Interventions: Exercise, Gait training, Functional transfer training, LE strengthening/ROM, Endurance training, Patient/family training, Equipment eval/education, Bed mobility, Continued evaluation, Stair training        PT Frequency: one time visit - therapy discontinued     Discharge from PT Acute Care Services.    [facility]  Bed: FI258/FI258-01     Precautions and Contraindications:   Precautions  Other Precautions: move in the tube sternal precautions; fall risk    Updated Medical Status/Imaging/Labs: Reviewed Chart     Subjective: Pt states, "I  though the stairs were going to be harder."   Patient Goal: to walk    Pain Assessment  Pain Assessment: No/denies pain    Patient's medical condition is appropriate for Physical Therapy intervention at this time.  Patient is agreeable to participation in the therapy session. Nursing clears patient for therapy.    Objective:   Observation of Patient/Vital Signs:  Patient is seated in a bedside chair with telemetry and external pacing box in place.  Pt wore mask during therapy session:Yes    Cognition/Neuro Status  Arousal/Alertness: Appropriate responses to stimuli  Attention Span: Appears intact  Orientation Level: Oriented X4  Memory: Appears intact  Following Commands: Follows multistep commands consistently  Safety Awareness: independent  Insights: Educated in safety awareness;Decreased awareness of deficits  Problem Solving: Assistance required to identify errors made;minimal assistance  Motor Planning: intact  Coordination: intact            Functional Mobility:  Sit to Stand: Supervision  Stand to Sit: Supervision       Ambulation:  PMP - Progressive Mobility Protocol   PMP Activity: Step 7 - Walks out of Room  Distance Walked (ft) (Step 6,7): 600 Feet     Ambulation: Supervision  Pattern: decreased step length;decreased cadence  Stair Management: Supervision;one rail L;step to pattern  Number of Stairs: 14       Patient Participation: Good  Patient Endurance: Fair     Patient left with call bell within reach, all needs met, SCDs not in place, fall mat in place, bed alarm n/a, chair alarm on and all questions answered. RN notified of session outcome and patient  response.     Goals:  Goals  Goal Formulation: With patient  Time for Goal Acheivement: 5 visits  Goals: Select goal  Pt Will Go Supine To Sit: with supervision, to maximize functional mobility and independence  Pt Will Perform Sit To Supine: with supervision, to maximize functional mobility and independence  Pt Will Stand: with supervision, to  maximize functional mobility and independence  Pt Will Ambulate: > 200 feet, with rolling walker, with supervision, to maximize functional mobility and independence  Pt Will Go Up / Down Stairs: 1 flight, with supervision, With rail, to maximize functional mobility and independence    PPE worn during session: procedural mask, goggles, and gloves    Tech present: No  PPE worn by tech: N/A    Time of Treatment  PT Received On: 11/30/21  Start Time: 1030  Stop Time: 1055  Time Calculation (min): 25 min  Treatment # 2 out of 5 visits    Smith Robert, DPT, pager [phone]

## 2021-11-30 NOTE — Progress Notes (Signed)
Date/Time:  11/30/21 4:03 AM   Patient Name:  Raymond Cook    Age: 55 y.o.   Room:  FI258/FI258-01     Cardiac Surgery RN Shift   Shift Events:   - none    Neuro  Ambulate with          [x]  assist       [x]  independently  PT/OT Dispo recs    Home  Pain Controlled        [x]  Yes           []  No           Cardiac  BP Controlled [x]  Yes          []  No          PRN meds given []  Yes            [x]  No      Rhythm                    Sinus rhythm      Arrhythmias         []  Yes            []  No      Pacing Wires           [x]  Yes           []  No                           Pulmonary                     O2 NC                      []  Yes           [x]  No                    Chest tubes              []  Yes           [x]  No                                                                            IS                                 G.I.   Tolerating Diet          [x]  Yes           []  No      BM last 24 hrs          [x]  Yes          []  No               Last BM Date: 11/29/21    PPI ppx                    []  Yes          []  No      Renal  Foley present            []  Yes          [x]  No                                            Heme  DVT Prophylaxis            [x]  Yes           []  No                    Anticoagulation        [x]  Yes           []  No       ID   Febrile                     []  Yes          [x]  No        Endocrine   Off insulin drip         [x]  Yes           []  No      BS controlled           [x]  Yes           []  No      _____________________________________________________________________________________         Barriers to discharge:   None    Anticipated discharge date:      Priority discharge:  []  Yes            [x]  No

## 2021-12-01 LAB — CBC
Absolute NRBC: 0 10*3/uL (ref 0.00–0.00)
Hematocrit: 39.1 % (ref 37.6–49.6)
Hgb: 13.2 g/dL (ref 12.5–17.1)
MCH: 30.8 pg (ref 25.1–33.5)
MCHC: 33.8 g/dL (ref 31.5–35.8)
MCV: 91.1 fL (ref 78.0–96.0)
MPV: 9.7 fL (ref 8.9–12.5)
Nucleated RBC: 0 /100 WBC (ref 0.0–0.0)
Platelets: 374 10*3/uL — ABNORMAL HIGH (ref 142–346)
RBC: 4.29 10*6/uL (ref 4.20–5.90)
RDW: 13 % (ref 11–15)
WBC: 9.93 10*3/uL — ABNORMAL HIGH (ref 3.10–9.50)

## 2021-12-01 LAB — POTASSIUM: Potassium: 4.6 mEq/L (ref 3.5–5.3)

## 2021-12-01 LAB — BASIC METABOLIC PANEL
Anion Gap: 7 (ref 5.0–15.0)
BUN: 19 mg/dL (ref 9.0–28.0)
CO2: 25 mEq/L (ref 17–29)
Calcium: 10 mg/dL (ref 8.5–10.5)
Chloride: 105 mEq/L (ref 99–111)
Creatinine: 1 mg/dL (ref 0.5–1.5)
Glucose: 106 mg/dL — ABNORMAL HIGH (ref 70–100)
Potassium: 5.7 mEq/L — ABNORMAL HIGH (ref 3.5–5.3)
Sodium: 137 mEq/L (ref 135–145)

## 2021-12-01 LAB — GLUCOSE WHOLE BLOOD - POCT
Whole Blood Glucose POCT: 97 mg/dL (ref 70–100)
Whole Blood Glucose POCT: 112 mg/dL — ABNORMAL HIGH (ref 70–100)
Whole Blood Glucose POCT: 120 mg/dL — ABNORMAL HIGH (ref 70–100)
Whole Blood Glucose POCT: 108 mg/dL — ABNORMAL HIGH (ref 70–100)

## 2021-12-01 LAB — GFR: EGFR: >60

## 2021-12-01 LAB — MAGNESIUM: Magnesium: 2 mg/dL (ref 1.6–2.6)

## 2021-12-01 MED ORDER — SODIUM CHLORIDE 0.9 % IV BOLUS
500.0000 mL | Freq: Once | INTRAVENOUS | Status: AC
Start: 2021-12-01 — End: 2021-12-01
  Administered 2021-12-01: 500 mL via INTRAVENOUS

## 2021-12-01 MED ORDER — METOPROLOL TARTRATE 25 MG PO TABS
75.0000 mg | ORAL_TABLET | Freq: Two times a day (BID) | ORAL | Status: DC
Start: 2021-12-01 — End: 2021-12-02
  Administered 2021-12-01: 75 mg via ORAL
  Filled 2021-12-01: qty 3

## 2021-12-01 MED ORDER — AMIODARONE HCL IN DEXTROSE 150-4.21 MG/100ML-% IV SOLN
150.0000 mg | Freq: Once | INTRAVENOUS | Status: AC
Start: 2021-12-01 — End: 2021-12-01
  Administered 2021-12-01: 150 mg via INTRAVENOUS

## 2021-12-01 MED ORDER — LISINOPRIL 5 MG PO TABS
5.0000 mg | ORAL_TABLET | Freq: Every day | ORAL | Status: DC
Start: 2021-12-02 — End: 2021-12-02
  Administered 2021-12-02: 5 mg via ORAL
  Filled 2021-12-01: qty 1

## 2021-12-01 MED ORDER — METOPROLOL TARTRATE 25 MG PO TABS
25.0000 mg | ORAL_TABLET | Freq: Once | ORAL | Status: AC
Start: 2021-12-01 — End: 2021-12-01
  Administered 2021-12-01: 25 mg via ORAL
  Filled 2021-12-01: qty 1

## 2021-12-01 NOTE — Progress Notes (Signed)
Date/Time:  12/01/21 12:17 AM   Patient Name:  Raymond Cook    Age: 55 y.o.   Room:  FI258/FI258-01     Cardiac Surgery RN Shift   Shift Events:   -     Neuro  Ambulate with          []  assist       [x]  independently  PT/OT Dispo recs    Home  Pain Controlled        [x]  Yes           []  No           Cardiac  BP Controlled [x]  Yes          []  No          PRN meds given []  Yes            []  No      Rhythm                    Sinus tachycardia      Arrhythmias         []  Yes            []  No      Pacing Wires           [x]  Yes           []  No                           Pulmonary                     O2 NC                      []  Yes           [x]  No                    Chest tubes              []  Yes           [x]  No                                                                            IS                                 G.I.   Tolerating Diet          [x]  Yes           []  No      BM last 24 hrs          [x]  Yes          []  No               Last BM Date: 11/30/21    PPI ppx                    [x]  Yes          []  No      Renal  Foley present            []  Yes          [x]  No                                            Heme  DVT Prophylaxis            [x]  Yes           []  No                    Anticoagulation        [x]  Yes           []  No       ID   Febrile                     []  Yes          [x]  No        Endocrine   Off insulin drip         [x]  Yes           []  No      BS controlled           [x]  Yes           []  No      _____________________________________________________________________________________         Barriers to discharge:   On lopressor to control heart rate.    Anticipated discharge date:   12/5    Priority discharge:  []  Yes            []  No

## 2021-12-01 NOTE — Plan of Care (Signed)
Problem: Safety  Goal: Patient will be free from injury during hospitalization  Outcome: Progressing  Goal: Patient will be free from infection during hospitalization  Outcome: Progressing     Problem: Pain  Goal: Pain at adequate level as identified by patient  Outcome: Progressing     Problem: Side Effects from Pain Analgesia  Goal: Patient will experience minimal side effects of analgesic therapy  Outcome: Progressing     Problem: Discharge Barriers  Goal: Patient will be discharged home or other facility with appropriate resources  Outcome: Progressing     Problem: Psychosocial and Spiritual Needs  Goal: Demonstrates ability to cope with hospitalization/illness  Outcome: Progressing     Problem: Moderate/High Fall Risk Score >5  Goal: Patient will remain free of falls  Outcome: Progressing     Problem: Pre-op Phase - Cardiac Surgery  Goal: Patient will be ready for surgery  Outcome: Progressing     Problem: Compromised Tissue integrity  Goal: Damaged tissue is healing and protected  Outcome: Progressing  Goal: Nutritional status is improving  Outcome: Progressing     Problem: Post-op Phase - Cardiac Surgery  Goal: Effective breathing pattern is maintained  Outcome: Progressing  Goal: Cardiac output is adequate  Outcome: Progressing  Goal: Patient will remain free from post-op complications  Outcome: Progressing  Goal: Mobility/activity is maintained at optimal level  Outcome: Progressing  Goal: Nutritional intake is adequate  Outcome: Progressing

## 2021-12-01 NOTE — Progress Notes (Signed)
Patient's tentative discharge date is tomorrow    His plan to return home with home health nursing continues    [facility] is following    His wife or son in law to drive him home    Grace Blight MSW  Case Manager  [facility]  [phone]  [email or web address]

## 2021-12-01 NOTE — Progress Notes (Signed)
Date/Time:  12/01/21 11:25 AM   Patient Name:  Raymond Cook    Age: 55 y.o.   Room:  FI258/FI258-01     Cardiac Surgery RN Shift   Shift Events:   - ST 140s, 500cc NS and schedule metop 50 given, down to ST 110-120s    Neuro  Ambulate with          []  assist       [x]  independently  PT/OT Dispo recs    Home  Pain Controlled        [x]  Yes           []  No           Cardiac  BP Controlled [x]  Yes          []  No          PRN meds given []  Yes            []  No      Rhythm                    Sinus tachycardia      Arrhythmias         []  Yes            []  No      Pacing Wires           [x]  Yes           []  No                           Pulmonary                     O2 NC                      []  Yes           [x]  No                    Chest tubes              []  Yes           [x]  No                                                                            IS                                 G.I.   Tolerating Diet          [x]  Yes           []  No      BM last 24 hrs          [x]  Yes          []  No               Last BM date 12/5    PPI ppx                    [x]  Yes          []   No      Renal                Foley present            []  Yes          [x]  No                                            Heme  DVT Prophylaxis            [x]  Yes           []  No                    Anticoagulation        [x]  Yes           []  No       ID   Febrile                     []  Yes          [x]  No        Endocrine   Off insulin drip         [x]  Yes           []  No      BS controlled           [x]  Yes           []  No      _____________________________________________________________________________________         Barriers to discharge:  Wires  ST >120  Anticipated discharge date:   12/6    Priority discharge:  []  Yes            [x]  No

## 2021-12-01 NOTE — Progress Notes (Signed)
Cardiac Surgery Progress Note  Date/Time:  12/01/21 2:56 PM   Patient Name:  Raymond Cook Age: 55 y.o.   Room:  FI258/FI258-01     Operative Profile:   POD#: 6    11/29:   CABG x 4 (LIMA-dLAD, SVG- PDA, RIMA-OM1, Lrad-OM2)             Surgeon: Austin Miles MD              From OR: Gtts: Cardene; Products: None; Rhythm: Paced over SB; Issues: none    LVEF: normal, RVEF normal  Cardiologist: Carient     Synopsis   Indication for Surgery: CAD     Preop Course: Raymond Cook is a 55 y.o. male who has been referred for CV Surgery evaluation. Patient has a history of CAD with previous PCI (DES to LAD 2015) and presented to [facility] 11/13/2021 for a cath due to recurrent chest pain.  He was found to have progression of CAD and was referred for an elective CV Surgery evaluation. Patient presented to [facility] 11/23/2021 with acute chest pain. Cardiac enzymes were negative and he ruled in for ACS.  His past medical history is significant for Hypertension and EtoH abuse, Remote Tobacco Abuse (1ppd x 10 years quit 1996).     PMH:  has a past medical history of Coronary artery disease, Hyperlipidemia, NSTEMI (non-ST elevated myocardial infarction), SOB (shortness of breath) on exertion, and Stented coronary artery.    Daily Summary (see running discharge summary for prior events)  11/17: Outpatient cardiac cath revealed multivessel CAD- referred for elective CV surgery evaluation   11/27: Presents to ER with chest pain   11/29: OR, extubated  11/30: Transferred to SDU, Urinary retention, had straight cath  12/01: Urinary retention again with >529mL on scan. Foley reinserted. Moderate dump from CT's on ambulation >21mL/shift  12/2: Tachycardic, decreased diuresis, Albumin x1 and increased metoprolol   12/3: increased Metop for HR, excessive diuresis--Albumin x 2, CTs removed, foley removed  12/4: resumed post op Amio for ST 110s, prn Xanax, Albumin x  1  12/5: IV bolus given for hypovolemia, tachycardia. Amio bolus x1    Plan:   Persistent ST--resume post op Amio, continue Metoprolol 50mg  bid  Anxiety--prn Xanax   Leave PWs while attempting rate control  Hold diuretics as patient is auto-diuresing  Likely home 12/6 if resting HR improves           System Assessment of Active Issues     Neurological?:  Pain management: Pain is well controlled on current regimen including increased gabapentin   Anxiety related to hospitalization- stable with prn Xanax    Cardiovascular:  Unstable angina, CAD s/p CABG   On ASA/Statin and BB  Atrial tachycardia- persistent  On amiodarone po and metoprolol po  PWs left in as pt occasionally drops to 70 from 110s, and pre op was noted to be 45-55  HTN - well controlled on Metoprolol and Lisinopril  HLD - Lipitor    Pulmonary:  Residual L pneumothorax after chest tube removal- stable  Asymptomatic on RA     Gastrointestinal:  No acute issues     Renal/GU:  Normal baseline renal function  Acute urinary retention s/p foley reinsertion - resolved  On Flomax, foley removed without issue     Infectious Disease:  Clinically, no acute issue    Hematological:  Acute blood loss anemia post op- stable    Endocrinological:  stress hyperglycemia- resolved  HgbA1C: 5.6    Subjective:   Sitting in chair, no current complaints    Physical Exam:   BP 125/75   Pulse (!) 110   Temp 98.6 F (37 C) (Oral)   Resp 19   Ht 1.778 m (5\' 10" )   Wt 88.1 kg (194 lb 3.2 oz)   SpO2 98%   BMI 27.86 kg/m     Physical Exam  Neuro: awake and alert  CV: rapid HR, regular  Pulm: Clear  GI; soft, non-tender, +BS  Wounds: dry and intact, sternum stable      CBC:  Recent Labs     12/01/21  0547   WBC 9.93*   Hgb 13.2   Hematocrit 39.1   Platelets 374*       BMP:  Recent Labs     12/01/21  0908 12/01/21  0547   Sodium  --  137   Potassium 4.6 5.7*   Chloride  --  105   CO2  --  25   BUN  --  19.0   Creatinine  --  1.0   Glucose  --  106*   Magnesium 2.0  --     Calcium  --  10.0                  Signed by: Felecia Jan, NP

## 2021-12-02 ENCOUNTER — Inpatient Hospital Stay: Payer: No Typology Code available for payment source

## 2021-12-02 ENCOUNTER — Inpatient Hospital Stay (HOSPITAL_COMMUNITY): Payer: No Typology Code available for payment source

## 2021-12-02 DIAGNOSIS — I3139 Other pericardial effusion (noninflammatory): Secondary | ICD-10-CM

## 2021-12-02 LAB — ECHOCARDIOGRAM ADULT LIMITED W CLR & DOPP WAVFRM LTD
AV Area (Cont Eq VTI): 2.55
AV Area (Cont Eq VTI): 2.551
AV Mean Gradient: 3
AV Peak Velocity: 116
Ao Root Diameter (2D): 3.6
BP Mod LV Ejection Fraction: 54.068
IVS Diastolic Thickness (2D): 1.4
LA Dimension (2D): 3
LVID diastole (2D): 3.8
LVID systole (2D): 2.6
Prox Ascending Aorta Diameter: 2.9
RV Basal Diastolic Dimension: 2.7
RV Function: NORMAL
RV Systolic Pressure: 20.472
Site RV Size (AS): NORMAL
TAPSE: 0.657

## 2021-12-02 LAB — BASIC METABOLIC PANEL
Anion Gap: 8 (ref 5.0–15.0)
BUN: 19 mg/dL (ref 9.0–28.0)
CO2: 23 mEq/L (ref 17–29)
Calcium: 9.5 mg/dL (ref 8.5–10.5)
Chloride: 105 mEq/L (ref 99–111)
Creatinine: 0.9 mg/dL (ref 0.5–1.5)
Glucose: 139 mg/dL — ABNORMAL HIGH (ref 70–100)
Potassium: 4.3 mEq/L (ref 3.5–5.3)
Sodium: 136 mEq/L (ref 135–145)

## 2021-12-02 LAB — ECG 12-LEAD
Atrial Rate: 109 {beats}/min
P Axis: 22 degrees
P-R Interval: 172 ms
Q-T Interval: 364 ms
QRS Duration: 90 ms
QTC Calculation (Bezet): 490 ms
R Axis: -17 degrees
T Axis: 0 degrees
Ventricular Rate: 109 {beats}/min

## 2021-12-02 LAB — MAGNESIUM: Magnesium: 2.1 mg/dL (ref 1.6–2.6)

## 2021-12-02 LAB — GFR: EGFR: >60

## 2021-12-02 MED ORDER — TAMSULOSIN HCL 0.4 MG PO CAPS
0.4000 mg | ORAL_CAPSULE | Freq: Every day | ORAL | 0 refills | Status: AC
Start: 2021-12-02 — End: ?

## 2021-12-02 MED ORDER — ACETAMINOPHEN 325 MG PO TABS
650.0000 mg | ORAL_TABLET | ORAL | Status: AC | PRN
Start: 2021-12-02 — End: ?

## 2021-12-02 MED ORDER — METOPROLOL TARTRATE 100 MG PO TABS
100.0000 mg | ORAL_TABLET | Freq: Two times a day (BID) | ORAL | 0 refills | Status: AC
Start: 2021-12-02 — End: ?

## 2021-12-02 MED ORDER — TRAMADOL HCL 50 MG PO TABS
50.0000 mg | ORAL_TABLET | Freq: Four times a day (QID) | ORAL | 0 refills | Status: AC | PRN
Start: 2021-12-02 — End: 2021-12-07

## 2021-12-02 MED ORDER — LISINOPRIL 2.5 MG PO TABS
2.5000 mg | ORAL_TABLET | Freq: Every day | ORAL | 0 refills | Status: AC
Start: 2021-12-03 — End: ?

## 2021-12-02 MED ORDER — METOPROLOL TARTRATE 100 MG PO TABS
100.0000 mg | ORAL_TABLET | Freq: Two times a day (BID) | ORAL | 0 refills | Status: DC
Start: 2021-12-02 — End: 2021-12-02

## 2021-12-02 MED ORDER — POLYETHYLENE GLYCOL 3350 17 G PO PACK
17.0000 g | PACK | Freq: Every day | ORAL | Status: AC | PRN
Start: 2021-12-02 — End: ?

## 2021-12-02 MED ORDER — SODIUM CHLORIDE 0.9 % IV BOLUS
1000.0000 mL | Freq: Once | INTRAVENOUS | Status: AC
Start: 2021-12-02 — End: 2021-12-02
  Administered 2021-12-02: 1000 mL via INTRAVENOUS

## 2021-12-02 MED ORDER — AMIODARONE HCL 200 MG PO TABS
200.0000 mg | ORAL_TABLET | Freq: Every day | ORAL | 0 refills | Status: DC
Start: 2021-12-02 — End: 2021-12-09

## 2021-12-02 MED ORDER — LISINOPRIL 5 MG PO TABS
2.5000 mg | ORAL_TABLET | Freq: Every day | ORAL | Status: DC
Start: 2021-12-03 — End: 2021-12-02

## 2021-12-02 MED ORDER — CLOPIDOGREL BISULFATE 75 MG PO TABS
75.0000 mg | ORAL_TABLET | Freq: Every day | ORAL | 0 refills | Status: AC
Start: 2021-12-02 — End: ?

## 2021-12-02 MED ORDER — METOPROLOL TARTRATE 25 MG PO TABS
100.0000 mg | ORAL_TABLET | Freq: Two times a day (BID) | ORAL | Status: DC
Start: 2021-12-02 — End: 2021-12-02
  Administered 2021-12-02: 100 mg via ORAL
  Filled 2021-12-02: qty 4

## 2021-12-02 MED ORDER — SENNOSIDES-DOCUSATE SODIUM 8.6-50 MG PO TABS
2.0000 | ORAL_TABLET | Freq: Two times a day (BID) | ORAL | Status: AC | PRN
Start: 2021-12-02 — End: ?

## 2021-12-02 NOTE — Progress Notes (Signed)
Date/Time:  12/02/21 12:57 AM   Patient Name:  Raymond Cook    Age: 55 y.o.   Room:  FI258/FI258-01     Cardiac Surgery RN Shift   Shift Events:   -     Neuro  Ambulate with          []  assist       [x]  independently  PT/OT Dispo recs    Home  Pain Controlled        [x]  Yes           []  No           Cardiac  BP Controlled [x]  Yes          []  No          PRN meds given []  Yes            []  No      Rhythm                    Sinus tachycardia      Arrhythmias         []  Yes            []  No      Pacing Wires           [x]  Yes           []  No                           Pulmonary                     O2 NC                      []  Yes           [x]  No                    Chest tubes              []  Yes           [x]  No                                                                            IS                                 G.I.   Tolerating Diet          [x]  Yes           []  No      BM last 24 hrs          [x]  Yes          []  No               Last BM Date: 12/01/21    PPI ppx                    [x]  Yes          []  No      Renal  Foley present            []  Yes          [x]  No                                            Heme  DVT Prophylaxis            [x]  Yes           []  No                    Anticoagulation        [x]  Yes           []  No       ID   Febrile                     []  Yes          [x]  No        Endocrine   Off insulin drip         [x]  Yes           []  No      BS controlled           [x]  Yes           []  No      _____________________________________________________________________________________         Barriers to discharge:   Patient's heart rate resolves     Anticipated discharge date:   12/6    Priority discharge:  []  Yes            [x]  No

## 2021-12-02 NOTE — Discharge Summary (Signed)
CARDIAC SURGERY DISCHARGE SUMMARY  Patient Name:  Raymond Cook Age: 55 y.o.   Room:  FI258/FI258-01     Date of Admission:   11/23/2021    Date of Discharge:   12/6    Operative Profile:   11/29:   CABG x 4 (LIMA-dLAD, SVG- PDA, RIMA-OM1, Lrad-OM2)             Surgeon: Austin Miles MD              From OR: Gtts: Cardene; Products: None; Rhythm: Paced over SB; Issues: none     LVEF: normal, RVEF normal  Cardiologist: Carient     Synopsis   Preop Course: 55 y.o. male who has been referred for CV Surgery evaluation. Patient has a history of CAD with previous PCI (DES to LAD 2015) and presented to [facility] 11/13/2021 for a cath due to recurrent chest pain.  He was found to have progression of CAD and was referred for an elective CV Surgery evaluation. Patient presented to [facility] 11/23/2021 with acute chest pain. Cardiac enzymes were negative and he ruled in for ACS.  His past medical history is significant for Hypertension and EtoH abuse, Remote Tobacco Abuse (1ppd x 10 years quit 1996).     PMH:  has a past medical history of Coronary artery disease, Hyperlipidemia, NSTEMI (non-ST elevated myocardial infarction), SOB (shortness of breath) on exertion, and Stented coronary artery.    Daily Summaries  11/17: Outpatient cardiac cath revealed multivessel CAD- referred for elective CV surgery evaluation   11/27: Presents to ER with chest pain   11/29: OR, extubated  11/30: Transferred to SDU, Urinary retention, had straight cath  12/01: Urinary retention again with >562mL on scan. Foley reinserted. Moderate dump from CT's on ambulation >232mL/shift  12/2: Tachycardic, decreased diuresis, Albumin x1 and increased metoprolol   12/3: increased Metop for HR, excessive diuresis--Albumin x 2, CTs removed, foley removed  12/4: resumed post op Amio for ST 110s, prn Xanax, Albumin x 1  12/5: IV bolus given for hypovolemia,  tachycardia. Amio bolus x1  12/6: HR improved with increased metoprolol and volume. Remains asymptomatic.  ECHO done. Wires pulled        [facility] Course and Diagnoses     Post-op Day Diagnosis and Treatment during this Hospital Course   POD 0-5 Unstable Angina/CAD s/p CABG- tolerating BB. On ASA/Stain  Sinus tachycardia due to hypovolemia- improved with volume resuscitation and metoprolol  HTN; well controlled on PO medications  HLD; stable on lipitor  Tiny L pneumothorax s/p chest tube removal- stable  Acute urinary retention w/ foley reinsertion- no removed and voiding on flomax  Acute blood loss anemia, stable post op     The patient tolerated the procedure and was transferred to the CVICU intubated and sedated.     Once sedation was weaned off, the patient was neurologically Intact and then was extubated to NC  without issue.  The CVICU postoperative course was not not complicated as noted above by the following diagnoses.     The patient was transferred out of the CVICU to the step down unit .    While on the stepdown unit, the patient developed sinus tachycardia rates 110-140 sustained.  He was treated with increasing beta blockers and volume resuscitation for hypovolemia.  Upon discharge he remained in NSR to sinus tachycardia with rates 70-110 with stable blood pressures. Prophylactic Amiodarone was tolerated per protocol after being started on POD 4 due  to being bradycardic initially post operativelyt. Antihypertensive medications were resumed and increased as the heart rate and blood pressure allowed, which did  included a betablocker. Chest tubes and pacing wires were removed without issue. With pulmonary hygiene, including deep breathing, coughing, and incentive spirometry, the patient was weaned off supplemental O2 to RA. Diet was slowly advanced and  tolerated. The patient did  have a bowel movement prior to discharge. Given postsurgical volume overload, the patient was aggressively diuresed  with close monitoring of electrolytes and kidney function. Electrolyte supplementation was provided as needed. SQ Heparin was utilized for DVT prophylaxis along with sequential compression devices. Blood sugars were closely monitored, treated as needed, and stable prior to discharge.     Given the patient's stress induced hyperglycemia in addition to underlying diabetes mellitus, endocrinology was consulted and directed this management including discharge recommendations.    Prior to discharge, the patient was ambulating without difficulty. Physical and Occupational therapy were consulted and assessed and treated throughout the hospital course. Recommendations for discharge were made to home. These recommendations were followed and the patient was agreeable for discharge on POD 6.     The family was updated throughout the hospitalization and understood the discharge plan. All questions were answered and the patient verbalized understanding of the discharge plan. They were instructed to call the surgeon's office with any questions or concerns including but not limited to fever, chills, shortness of breath, change in chest pain,  change with their incision.      Consultations:   None    Discharge Profile:     Vitals:    12/02/21 1340   BP: 98/66   Pulse: 78   Resp:    Temp:    SpO2:      Results       Procedure Component Value Units Date/Time    Magnesium [[id number]] Collected: 12/02/21 1006    Specimen: Blood Updated: 12/02/21 1059     Magnesium 2.1 mg/dL     GFR [[id number]] Collected: 12/02/21 1006     Updated: 12/02/21 1059     EGFR >60.0       Basic Metabolic Panel [[id number]]  (Abnormal) Collected: 12/02/21 1006    Specimen: Blood Updated: 12/02/21 1059     Glucose 139 mg/dL      BUN 08.6 mg/dL      Creatinine 0.9 mg/dL      Calcium 9.5 mg/dL      Sodium 578 mEq/L      Potassium 4.3 mEq/L      Chloride 105 mEq/L      CO2 23 mEq/L      Anion Gap 8.0    Glucose Whole Blood - POCT [[id number]]  (Abnormal) Collected:  12/01/21 1614     Updated: 12/01/21 1636     Whole Blood Glucose POCT 108 mg/dL             Physical Exam:  Neuro: AxOx3, non focal exam  CV: S1S2, RRR- tachycardic with palpable distal pulses. No edema  Pulm: CTAB  GI: soft, NT, ND  Skin: MSI cdi    Discharge Medications:        Medication List        ASK your doctor about these medications      aspirin EC 81 MG EC tablet     atorvastatin 80 MG tablet  Commonly known as: LIPITOR  TAKE 1 TABLET BY MOUTH EVERY DAY     esomeprazole 20 MG capsule  Commonly known as:  NexIUM     lisinopril 20 MG tablet  Commonly known as: ZESTRIL  TAKE 1 TABLET BY MOUTH DAILY     metoprolol tartrate 25 MG tablet  Commonly known as: LOPRESSOR  TAKE 1 TABLET BY MOUTH TWICE A DAY     NITRO-PRO PO     Omega-3 Fish Oil 500 MG Caps     ranolazine 500 MG 12 hr tablet  Commonly known as: RANEXA     vitamin B complex (lipotropic) tablet                Discharge Instructions:   Sternal precautions for 6 weeks    Follow-up with C-V surgeon as scheduled  Follow up with cardiology in 2 weeks  Monitor HR and BP and bring vital sign log to follow up appointment    Care Team Discharge Plan    Amiodarone taper ordered      Signed by: Felecia Jan, NP

## 2021-12-02 NOTE — UM Notes (Signed)
[facility] Utilization Review  [address] [place], [place]  [place] [id number], Tax [place] [id number]   Please call [phone] with any questions or concerns - Confidential Voice Mail  Fax final authorizations and requests for additional information to [phone].    Ref# [id number]    CSR 12/5: POD #6 s/p CABG x 4 (LIMA-dLAD, SVG- PDA, RIMA-OM1, Lrad-OM2)    IV bolus given for hypovolemia, tachycardia. Amio bolus x1  Persistent ST--resume post op Amio, continue Metoprolol 50mg  bid  Anxiety--prn Xanax   Leave PWs while attempting rate control  Hold diuretics as patient is auto-diuresing    Neuro: awake and alert  CV: rapid HR, regular  Pulm: Clear  GI; soft, non-tender, +BS  Wounds: dry and intact, sternum stable      VS: 98.6-110-19 125/75 sats 98    Labs: WBC 9.93, Glu 106, K 5.7, Plt 374,     Scheduled Meds:  Current Facility-Administered Medications  Medication Dose Route Frequency   amiodarone  200 mg Oral Q12H [facility]   aspirin EC  81 mg Oral Daily   atorvastatin  80 mg Oral QHS   famotidine  20 mg Oral Q12H [facility]   gabapentin  200 mg Oral Q8H [facility]   heparin (porcine)  5,000 Units Subcutaneous Q8H [facility]   lisinopril  5 mg Oral Daily   metoprolol tartrate  100 mg Oral Q12H [facility]   polyethylene glycol  17 g Oral Daily   senna-docusate  2 tablet Oral Q12H [facility]   tamsulosin  0.4 mg Oral Daily after dinner    Continuous Infusions:  PRN Meds:.acetaminophen, ALPRAZolam, bisacodyl, lidocaine, magnesium oxide **AND** magnesium sulfate, ondansetron **OR** ondansetron, potassium & sodium phosphates, potassium chloride **AND** potassium chloride, traMADol      Assessment/Plan:  Neurological?:  ? Pain management: Pain is well controlled on current regimen including increased gabapentin   ? Anxiety related to hospitalization- stable with prn Xanax     Cardiovascular:  ? Unstable angina, CAD s/p CABG   ? On ASA/Statin and BB  ? Atrial tachycardia- persistent  ? On amiodarone po and metoprolol po  ? PWs left in as pt  occasionally drops to 70 from 110s, and pre op was noted to be 45-55  ? HTN - well controlled on Metoprolol and Lisinopril  ? HLD - Lipitor     Pulmonary:  ? Residual L pneumothorax after chest tube removal- stable  ? Asymptomatic on RA     Gastrointestinal:  ? No acute issues     Renal/GU:  ? Normal baseline renal function  ? Acute urinary retention s/p foley reinsertion - resolved  ? On Flomax, foley removed without issue     Infectious Disease:  ? Clinically, no acute issue     Hematological:  ? Acute blood loss anemia post op- stable     Endocrinological:  ? stress hyperglycemia- resolved  ? HgbA1C: 5.6

## 2021-12-02 NOTE — Progress Notes (Signed)
Cardiac surgery post-op and discharge instructions folder provided to patient and reviewed the following information with patient / family (wife):  1-Movement Guidelines -Moving in The Tube (Handout provided) / activity restrictions  2-Cardiac / consistent carbohydrate diet (handout provided)   3-Daily Incision care post-cardiac surgery (using Dove sensitive Skin Body wash) and signs and symptoms of infection  4-Use of incentive spirometer (10 times/ hour) while awake, with return demonstration  5-Checking vital signs daily including:  weight, temperature, blood pressure (BP) and heart rate (HR)  (before taking medications which can affect BP and / or HR),    6-When to call the surgeon's office / or 911 (in case of an emergency)  7-Medications and side effects,   8-Cardiac Rehab reviewed with patient/ family and handout provided  9-Follow up appointments post-surgery with Cardiac Surgery Nurse Practitioner, cardiologist,  and PCP.    Verbalize understanding, have no further questions at this time.    Marisal Swarey MSN, RN, PCCN, CHTP  Interim Patient Care Navigator  Cardiovascular Stepdown Unit  [facility]

## 2021-12-02 NOTE — Discharge Summary -  Nursing (Signed)
Reviewed the following with patient and family. Sternal precautions, cardiac diet, follow up appts w/ surgeon, PCP, prescriptions and their common side effects, medications administration guide with when the next doses are due. Incision care, s/s of infection, monitoring vital signs, home care visits and cardiac rehab. Also reviewed when to call MD vs. 911. Tele and IV's d/c'd w/ cannula intact.

## 2021-12-02 NOTE — Discharge Instr - AVS First Page (Addendum)
MY CARDIAC SURGERY        The operation I had was : CABG        My Incisions are located: chest        I needed this operation to: improve blood flow to my heart        My cardiac surgeon is: Austin Miles, MD        My ejection fraction is: 54% (a measure of how well my heart pumps - normal is 55-60%)    SEE ACCOMPANYING PURPLE FORM IN YOUR DISCHARGE PACKET   ([facility] CARDIAC SURGERY DISCHARGE INSTRUCTIONS)

## 2021-12-02 NOTE — Progress Notes (Signed)
Home Health Referral      Referral from Sherene Sires   (Case Manager) for home health care upon discharge.    By [employer], the patient has the right to freely choose a home care provider.    Arrangements have been made with:    A company of the patients choosing. We have supplied the patient with a listing of providers in your area who asked to be included and participate in Medicare.   [facility], formerly [facility], a home care agency that provides adult home care services and participates in Medicare   The preferred provider of your insurance company. Choosing a home care provider other than your insurance company's preferred provider may affect your insurance coverage.      Home Health Discharge Information    Your doctor has ordered Skilled Nursing in-home service(s) for you while you recuperate at home, to assist you in the transition from hospital to home.    The agency that you or your representative chose to provide the service:  Name of Home Health Agency Placement: [facility] [phone]      The above services were set up by:  Ajee Heasley L. Romeo Apple, RN [facility] Liaison) Phone [phone]      IF YOU HAVE NOT HEARD FROM YOUR HOME YOUR HOME HEALTH AGENCY WITHIN 48 HOURS AFTER DISCHARGE PLEASE CALL YOUR AGENCY TO ARRANGE A TIME FOR YOUR FIRST VISIT. FOR ANY SCHEDULING CONCERNS OR QUESTIONS RELATED TO HOME HEALTH, SUCH AS TIME OR DATE PLEASE CONTACT YOUR HOME HEALTH AGENCY AT THE NUMBER LISTED ABOVE.    Additional comments:Patient agrees to home health services listed above. Patient agrees to use home health agency in network with patient's insurance.      HOME HEALTH REFERRAL    PATIENT DEMOGRAPHICS:        Name: Raymond Cook    Discharge Address: [address]  [place] [address]      Primary Telephone Number:  [phone] wife, Domingo Sep Telephone Number: [phone] patient's cell  [place] Telephone Number: [phone] home  Emergency Contact and  Number: Extended Emergency Contact Information  Primary Emergency Contact: Loudin,Janice  Address: [address]           [place], [place] [address] [name]  Home Phone: [phone]  Mobile Phone: [phone]  Relation: Spouse      Ordering Physician:Ramesh Thedore Mins. Cardiac surgeon    Following Physician: Austin Miles. Cardiac surgeon    Language/Communication Barrier:    No    Primary Diagnosis and Reason for Services:    Unstable Angina  CAD  S/P Procedure: 11/25/21   1.  Urgent coronary artery bypass graft x4 (LIMA to distal LAD, saphenous   vein graft to PDA, RIMA to OM1, radial to OM2).  2.  Left lower extremity endoscopic vein harvest.  3.  Left upper extremity endoscopic radial harvest.  HTN  HLD      [employer] (Labs, Wounds, Infusions, etc.):NA      Additional Comments:   48 hour cardiac surgery patient  ICTS standing orders    COVID-19 Screening:   11/24/21 17:41   SARS CoV-2 Negative   SARS-CoV-2 Specimen Source Nasopharyngeal       Discharge Date: 12/02/21  [facility] Date:12/04/21    Referral Source (PACC/Hospital/Unit): Daja Shuping L. Romeo Apple, RN  Referral Date: 12/02/21       Home Health face-to-face (FTF) Encounter (Order [id number])  Consult  Date: 12/02/2021 [facility] Ordering/Authorizing: Austin Miles, MD     Order Information    Order Date/Time Release Date/Time Start Date/Time End Date/Time   12/02/21 03:16 PM None 12/02/21 03:15 PM 12/02/21 03:15 PM     Order Details    Frequency Duration Priority Order Class   Once 1  occurrence Routine Hospital Performed     Standing Order Information    Remaining Occurrences Interval Last Released     0/1 Once 12/02/2021              Provider Information    Ordering User Ordering Provider Authorizing Provider   Raymondo Band, RN Austin Miles, MD Austin Miles, MD   Attending Provider(s) Admitting Provider PCP   Ria Bush, MD; Leda Gauze, MD; Fritz Pickerel, MD; Austin Miles, MD Morton Peters, DO Estella Husk, DO     Verbal Order Info    Action Created on Order Mode Entered by Responsible Provider Signed by Signed on   Ordering 12/02/21 1516 Per protocol: cosign required Raymondo Band, RN Austin Miles, MD             Comments    Additional orders:   Surgeon requests [facility] to be second full day home after [place] from hospital      ICTS standing orders:     ROUTINE ORDERS FOR [place] CARDIAC AND THORACIC SURGERY   [facility] HOME HEALTH NURSING INSTRUCTIONS FOR CARDIAC SURGERY PATIENTS     Notify the surgeon for:   Oral temperature >100.5 (F)   Heart rate <50 or over 100 at rest   Blood pressure systolic <90 or >150, diastolic <50 or >90   Respirations <12 or >25   Orthostatic hypotension: Systolic & or more, Diastolic I 10 mmH, or HR 1-20 bpm or more   Fasting capillary blood glucose <70 or >250   Weight gain over 3 lbs. in 24 hours or >5 lbs in one week   Excessive tiredness or weakness   Shortness of breath (particularly at rest)   Angina-like chest pain   Nausea or vomiting   Excessive swelling of legs   Excessive swelling of arms (if radial artery used)   Increased redness, tenderness or painful incision, especially if accompanied by drainage or fever   Blood in urine or stool   Oxygen saturation <90%   No BM for 3 days following discharge     Routine Incision and Dressing Care (Sternal, leg, arm, chest tube sites):   Patients should shower daily. Remove plain gauze dressings if present and use liquid body wash (Dove   sensitive skin is recommended). Use a separate clean washcloth for each incision area. Use a clean   towel to dry.   Patients with Prevena dressings should keep them on until postoperative day 7. Patients can shower   with the Prevena on but should avoid direct spraying of water on the dressing. Home health RN to   remove Prevena on postoperative day 7, if it is still in place.   Patients with Dermabond Prineo mesh dressing should leave it in place until it falls off. Peeled  up edges   can be trimmed carefully with clean scissors. Patients with Prineo dressing can shower, and get the   dressing wet.   Do not apply lotions, creams, powder, or ointments to incisions   Do not try and close chest tube sites with Steri-strips   If chest tube sutures are present, RN  may remove 48 hours after the start of home health care.   If sternal, chest tube sites, or graft harvest sites are dry and intact, keep open to air. If sternal, chest   tube sites, or graft sites are draining, cover with dry gauze and tape. Change daily, or as needed, until   newly epithelialized, then leave open to air. May instruct care giver in wound care.   Advise patients with any leg swelling to elevate legs, ideally higher than the level of the heart, or to a   comfortable level.   If incisions have increased drainage, necrotic tissue, foul odor, or if new open wounds are present,   notify the cardiac surgery office immediately.   Upper extremities that are swollen due to radial artery harvest should be elevated when possible. If   the fingers of the hand from which the radial artery was harvested are cold, pale, or have poor capillary   refill, notify the cardiac surgery office immediately. If any signs of infection or moderate to severe   swelling, notify the cardiac surgery office.     Routine Care Orders (per visit):   VS, heart rhythm, wound inspection   Apical pulse for 1 full minute   Pulmonary: incentive spirometer 10x/hour while awake, assess breath sounds   Activities: encourage out of bed during the day, daily showers   "Loosening up" exercises - continue with activity restrictions given to patient in discharge folder or by PT/OT (move within the tube). If lifting > 10 Ibs, ensure patient is moving within the tube as taught in the hospital.   Diet: no added salt for the first 6 weeks, then "Heart Healthy Diet"   Medications: per Epic AVS. Patient will have a printed copy.   Bowel movements: may use Pericolace  1-2 tabs PO BID prn constipation. If no BM by day 3, Milk of Magnesia 2 TBSP PO daily prn constipation   Pain: assess all systems   Pulse oximetry PRN for SOB, tachycardia, and/or to assess patient's tolerance of activity   Driving: do not drive until cleared at office visit   Reinforce "move within the tube"   Assess and reinforce fall precautions     Lab Work   As ordered at discharge   If PT/INR is ordered, obtain finger stick, may do venipuncture if necessary   An attempt should be made to complete coagulation tests early in the day, as same day results are expected prior to close of office by 1700. If this cannot be accomplished, please notify managing physician ASAP so that a dosing plan can be established with the patient prior to closing that day.   If visiting RN needs to contact office personnel regarding problems, and wish to add lab work to the orders please call directly for new orders (i.e. urinary symptoms: get UA/C&S; for fever/infection: get CBC with differential)     Miscellaneous   Home health RN should ensure that patient has follow up appointments made with his/her primary care doctor and cardiologist if these were not made prior to discharge.     Home Care Visits   Patients should receive 2 visits per week for 2 weeks, then 1 visit a week for 2 weeks.     QUESTIONS: For any questions regarding the number of visits, please call the cardiac surgery office APP at [phone] M-F 9 AM-5 PM. All other hours (evenings, weekends, and holidays) please call the surgeons' answering service at [phone] to reach the on-call  provider.       Primary Diagnosis and Reason for Services:     Unstable Angina   CAD   S/P Procedure: 11/25/21   1.  Urgent coronary artery bypass graft x4 (LIMA to distal LAD, saphenous   vein graft to PDA, RIMA to OM1, radial to OM2).   2.  Left lower extremity endoscopic vein harvest.   3.  Left upper extremity endoscopic radial harvest.   HTN   HLD       Home nursing required  for skilled assessment including post-operative assessment and instruction, incision assessment and instructions re: S/S infection, cardiopulmonary assessment and dietary education for disease management, and medication instruction.                Home Health face-to-face (FTF) Encounter: Patient Communication     Not Released  Not seen         Order Questions    Question Answer   Date I saw the patient face-to-face: 12/02/2021   Evidence this patient is homebound because: A.  Post operative restrictions, weight bearing status impedes mobility > 5 feet    C.  Decreased endurance, strength, ROM, cadence, safety/judgment during mobility    G.  Fall risk due to impaired coordination, gait and decreased balance    I.  Restricted to home to decrease risk of infection    N.  Impaired mobility d/t pain, arthritis, weakness that compromises patient safety   Medical conditions that necessitate Home Health care: A.  Post operative procedure requiring follow up care & monitoring for complication    B.  Functional impairment due to recent hospitalization/procedure/treatment    C.  Risk for complication/infection/pain requiring follow up and monitoring    E.  Exacerbation of disease requiring follow up monitoring    H.  Multiple new medications requiring management and monitoring   Per clinical findings, following services are medically necessary: Skilled Nursing   Clinical findings that support the need for Skilled Nursing. SN will: A. Educate on post operative care, restrictions & management of complications    B. Monitor post operative site for infection and educate on care management    C. Monitor for signs and symptoms of exacerbation of disease and management    D. Review medication reconciliation, manage and educate on use and side effects    H. Assess cardiopulmonary status and monitor for signs &symptoms of exacerbation    I.  Educate dietary and or fluid restrictions and weight management    M. Instruct use of inhalers  and/ or nebulizers, incentive spirometer   Other (please specify) See comments                        Dr. Austin Miles ([facility] surgeon) [phone] will be attending until discharged from surgeon.                    Process Instructions    Please select Home Care Services medically necessary.     Based on the above findings, I certify that this patient is confined to the home and needs intermittent skilled nursing care, physical therapry and / or speech therapy or continues to need occupational therapy. The patient is under my care, and I have initiated the establishment of the plan of care. This patient will be followed by a physician who will periodically review the plan of care.      Collection Information  Consult Order Info    ID Description Priority Start Date Start Time   [id number] Home Health face-to-face (FTF) Encounter Routine 12/02/2021  3:15 PM   Provider Specialty Referred to   ______________________________________ _____________________________________                         Verbal Order Info    Action Created on Order Mode Entered by Responsible Provider Signed by Signed on   Ordering 12/02/21 1516 Per protocol: cosign required Raymondo Band, RN Austin Miles, MD             Patient Information    Patient Name   Bryler, Dibble Legal Sex   Male DOB   05/31/1966       Reprint Order Requisition    Home Health face-to-face (FTF) Encounter (Order #[id number]) on 12/02/21       Additional Information    Associated Reports External References   Priority and Order Details [employer]         Signed by: Jashon Ishida L. Romeo Apple, RN  Date Time: 12/02/21 3:04 PM

## 2021-12-02 NOTE — Progress Notes (Signed)
Date/Time:  12/02/21 3:04 PM   Patient Name:  Raymond Cook    Age: 55 y.o.   Room:  FI258/FI258-01     Cardiac Surgery RN Shift   Shift Events:   - 1L NS bolus, 12 lead EKG confirmed NSR and ST, wires removed, pending portable Xray    Neuro  Ambulate with          []  assist       [x]  independently  PT/OT Dispo recs    Home  Pain Controlled        [x]  Yes           []  No           Cardiac  BP Controlled [x]  Yes          []  No          PRN meds given []  Yes            [x]  No      Rhythm                    Sinus rhythm, Sinus Tach      Arrhythmias         []  Yes            [x]  No      Pacing Wires           []  Yes           [x]  No                           Pulmonary                     O2 NC                      []  Yes           [x]  No                    Chest tubes              []  Yes           [x]  No                                                                            IS                                 G.I.   Tolerating Diet          [x]  Yes           []  No      BM last 24 hrs          [x]  Yes          []  No               Last BM Date: 12/01/21    PPI ppx                    [x]  Yes          []   No      Renal                Foley present            []  Yes          [x]  No                                            Heme  DVT Prophylaxis            [x]  Yes           []  No                    Anticoagulation        [x]  Yes           []  No       ID   Febrile                     []  Yes          [x]  No        Endocrine   Off insulin drip         [x]  Yes           []  No      BS controlled           [x]  Yes           []  No      _____________________________________________________________________________________         Barriers to discharge:   Xray    Anticipated discharge date:   12/6    Priority discharge:  []  Yes            [x]  No

## 2021-12-02 NOTE — Plan of Care (Signed)
Problem: Safety  Goal: Patient will be free from injury during hospitalization  Outcome: Progressing  Goal: Patient will be free from infection during hospitalization  Outcome: Progressing     Problem: Pain  Goal: Pain at adequate level as identified by patient  Outcome: Progressing     Problem: Side Effects from Pain Analgesia  Goal: Patient will experience minimal side effects of analgesic therapy  Outcome: Progressing     Problem: Discharge Barriers  Goal: Patient will be discharged home or other facility with appropriate resources  Outcome: Progressing     Problem: Psychosocial and Spiritual Needs  Goal: Demonstrates ability to cope with hospitalization/illness  Outcome: Progressing     Problem: Moderate/High Fall Risk Score >5  Goal: Patient will remain free of falls  Outcome: Progressing     Problem: Pre-op Phase - Cardiac Surgery  Goal: Patient will be ready for surgery  Outcome: Progressing     Problem: Compromised Tissue integrity  Goal: Damaged tissue is healing and protected  Outcome: Progressing  Goal: Nutritional status is improving  Outcome: Progressing     Problem: Post-op Phase - Cardiac Surgery  Goal: Effective breathing pattern is maintained  Outcome: Progressing  Goal: Cardiac output is adequate  Outcome: Progressing  Goal: Patient will remain free from post-op complications  Outcome: Progressing  Goal: Mobility/activity is maintained at optimal level  Outcome: Progressing  Goal: Nutritional intake is adequate  Outcome: Progressing

## 2021-12-04 ENCOUNTER — Telehealth: Payer: Self-pay

## 2021-12-04 ENCOUNTER — Telehealth (INDEPENDENT_AMBULATORY_CARE_PROVIDER_SITE_OTHER): Payer: Self-pay | Admitting: Physician Assistant

## 2021-12-04 ENCOUNTER — Telehealth (INDEPENDENT_AMBULATORY_CARE_PROVIDER_SITE_OTHER): Payer: Self-pay

## 2021-12-04 NOTE — Telephone Encounter (Signed)
Called and spoke with patient after taking 1/2 tablet of 100 mg Metoprolol tartrate, pt reports BP 110/79 and HR was 137 and currently HR is 71.  Pt reports it is bouncing from 70-120's depending on activity or conversation.  Pt requested guidance for evening dose of metoprolol, recommended taking 100 mg dose if SBP >110 since he will be going to bed. Also recommended to continue good hydration, dehydration can contribute to tachycardia. Our office will reach out tomorrow for update--reiterated to patient if he becomes hypotensive with symptoms or experiences sustained tachycardia in 130-40's , do not hesitate to call EMS - Pt understood and agreed with plan.

## 2021-12-04 NOTE — Telephone Encounter (Signed)
Date of surgery: 11/25/21  Surgery: Urgent coronary artery bypass graft x4 (LIMA to distal LAD, SVG to PDA, RIMA to OM1, radial to OM2).  Surgeon: Thedore Mins  Discharged: 12/02/21  Postop appointment: 09/09/21    Concern/complaint of: elevated HR, hypotension s/p metoprolol yesterday, concern his BP will drop again,     Assessment: Received call from pt and spouse reporting that yesterday patient vitals upon waking- BP 117/83 HR 138 - one hour s/p metoprolol tartrate 100mg  BP fell to 83/58 with HR 119 - patient did endorse being very dizzy at that time.    This morning pt's BP 106/80 HR 133. Patient and wife concerned that if they take the beta blocker his BP will drop even lower than yesterday. At the same time they are concerned that his HR will be even more elevated. Of note in hospital pt was tachycardic after surgery. Multiple EKGs showed sinus tachycardia. No a fib documented.     Pt instructed to hold metoprolol until RN can review with provider for best next step.

## 2021-12-04 NOTE — Telephone Encounter (Signed)
Discharge follow-up call placed by [facility] Unit Supervisor/Charge Nurse    Spoke with: Raymond Cook   General:     How are you feeling? "Pretty good".    Walking/using IS Yes / Yes    Have you had any shortness of breath or felt your heart racing?  No, only a little shortness of breath with activity, resolves on resting.    Can you rate your pain on scale 0-10? 2/10    Have you called to make your follow up appointments yet?  Not yet, will try to make follow up appointments today.    Have you been contacted/visited by Home Health? Yes, will call today to let us know when the [facility] nurse will be coming tomorrow.   Medications:       Have you gotten all of your prescriptions filled? Yes    Does the patient demonstrate understanding of what medications to take and when? Yes   Have you had a bowel movement yet   Multiple (Reviewed information)    Have you started filling out your vitals and activity log for the time you've been home?  Yes   BP: 106/80                                            HR:  133 this morning (Called the CV surgery office to notify NP and for further instructions)   Glucose : N/A   Temp: 98.4    Weight: 189 lbs.   Incision care:        Have you examined your incisions today? Yes    Is there any increased redness or drainage? No    Does patient demonstrate understanding of where incisions are located and how to care for them? Yes, showered yesterday.    Does patient understand importance of "Moving in - The Tube" Yes   Contact numbers and Follow Up:     Do you know how to contact the surgeon's office in an emergency? Yes , called this morning for elevated heart rate.    Do you know when you should call 911 instead? Yes    Do you have your contact information for cardiac rehab? Yes   Are you wearing your bracelet with the contact #? "Don't have it on right now, but know the purpose of it".    Does the patient have any further questions/issues? No   Instructed to call Cardiac Surgery office at [phone]  with any problems, questions or concerns.  Yes

## 2021-12-04 NOTE — Telephone Encounter (Signed)
Spoke with PA - advised patient to take half dose of metoprolol. Pt called back with vitals 1.5 hours after administration reporting BP 110/68 HR 137.

## 2021-12-05 NOTE — Progress Notes (Unsigned)
Cardiac Surgery Post Operative    Patient Name: Raymond Cook Age: 55 y.o.   Today's Date: 12/05/2021  Gender: male   Date of Birth: 1966/12/01 Race: White   Surgeon: Austin Miles MD Surgery: CABG x 4 (LIMA-dLAD, SVG- PDA, RIMA-OM1, Lrad-OM2)   Referring Physician(s): ED Admit/Carient Primary Care Physician: Estella Husk, DO   Date of Surgery: on November 25, 2021 Discharge Date: on December 02, 2021     PMH: Coronary artery disease, Hyperlipidemia, NSTEMI (non-ST elevated myocardial infarction), SOB (shortness of breath) on exertion, and Stented coronary artery.    Hospital Course:  Raymond Cook presents today for a post-operative visit.  Patient is a 55 y.o. male who has been referred for CV Surgery evaluation. Patient has a history of CAD with previous PCI (DES to LAD 2015) and presented to [facility] 11/13/2021 for a cath due to recurrent chest pain.  He was found to have progression of CAD and was referred for an elective CV Surgery evaluation. Patient presented to [facility] 11/23/2021 with acute chest pain. Cardiac enzymes were negative and he ruled in for ACS.  His past medical history is significant for Hypertension and EtoH abuse, Remote Tobacco Abuse (1ppd x 10 years quit 1996).     Daily Events:  11/17: Outpatient cardiac cath revealed multivessel CAD- referred for elective CV surgery evaluation   11/27: Presents to ER with chest pain   11/29: OR, extubated  11/30: Transferred to SDU, Urinary retention, had straight cath  12/01: Urinary retention again with >577mL on scan. Foley reinserted. Moderate dump from CT's on ambulation >244mL/shift  12/2: Tachycardic, decreased diuresis, Albumin x1 and increased metoprolol   12/3: increased Metop for HR, excessive diuresis--Albumin x 2, CTs removed, foley removed  12/4: resumed post op Amio for ST 110s, prn Xanax, Albumin x 1  12/5: IV bolus given for hypovolemia, tachycardia. Amio bolus x1  12/6: HR improved with  increased metoprolol and volume. Remains asymptomatic.  ECHO done. Wires pulled. Patient Miller'd.    Given the patient's stress induced hyperglycemia in addition to underlying diabetes mellitus, endocrinology was consulted and directed this management including discharge recommendations. HgbA1C: 5.6.    Radiology and Test Review:     CXR on 12/02/21  1. Improving bibasilar atelectasis. Left apical pneumothorax is no  longer confidently identified.      Review of Systems    No Known Allergies  Current Outpatient Medications   Medication Sig Dispense Refill    acetaminophen (TYLENOL) 325 MG tablet Take 2 tablets (650 mg) by mouth every 4 (four) hours as needed for Pain or Fever      amiodarone (PACERONE) 200 MG tablet Take 1 tablet (200 mg) by mouth daily for 10 days This is a maintenance dose. 10 tablet 0    aspirin EC 81 MG EC tablet Take 81 mg by mouth daily.      atorvastatin (LIPITOR) 80 MG tablet TAKE 1 TABLET BY MOUTH EVERY DAY 90 tablet 3    clopidogrel (PLAVIX) 75 mg tablet Take 1 tablet (75 mg) by mouth daily 30 tablet 0    esomeprazole (NEXIUM) 20 MG capsule Take 20 mg by mouth every other day.         lisinopril (ZESTRIL) 2.5 MG tablet Take 1 tablet (2.5 mg) by mouth daily 30 tablet 0    metoprolol tartrate (LOPRESSOR) 100 MG tablet Take 1 tablet (100 mg) by mouth every 12 (twelve) hours HOLD for heart rate less than  60 and Systolic blood pressure less than 100 30 tablet 0    Omega-3 Fatty Acids (OMEGA-3 FISH OIL) 500 MG Cap Take by mouth 2 (two) times daily.      polyethylene glycol (MIRALAX) 17 g packet Take 17 g by mouth daily as needed (constipation)      senna-docusate (PERICOLACE) 8.6-50 MG per tablet Take 2 tablets by mouth every 12 (twelve) hours as needed for Constipation      tamsulosin (FLOMAX) 0.4 MG Cap Take 1 capsule (0.4 mg) by mouth Daily after dinner 30 capsule 0    traMADol (ULTRAM) 50 MG tablet Take 1 tablet (50 mg) by mouth every 6 (six) hours as needed for Pain 15 tablet 0     Vitamins-Lipotropics (vitamin B complex, lipotropic,) tablet Take 1 tablet by mouth daily       No current facility-administered medications for this visit.        There were no vitals taken for this visit.    Objective:   Physical Exam     Labs:  Lab Results   Component Value Date    WBC 9.93 (H) 12/01/2021    HGB 13.2 12/01/2021    HCT 39.1 12/01/2021    MCV 91.1 12/01/2021    PLT 374 (H) 12/01/2021     Lab Results   Component Value Date    GLU 139 (H) 12/02/2021    BUN 19.0 12/02/2021    CREAT 0.9 12/02/2021    CA 9.5 12/02/2021    NA 136 12/02/2021    K 4.3 12/02/2021    CL 105 12/02/2021    CO2 23 12/02/2021     Lab Results   Component Value Date    ALT 53 11/27/2021    AST 48 (H) 11/27/2021    ALKPHOS 37 11/27/2021    BILITOTAL 0.7 11/27/2021         Diagnosis:   There are no diagnoses linked to this encounter.      Assessment/Plan:   Status post {:53230}.      Follow-Ups    Follow up with [facility]  Schedule an appointment with Estella Husk, DO (Family Medicine) in 4 weeks (12/31/2021)  Schedule an appointment with Miachel Roux, MD (Cardiology) in 2 weeks (12/17/2021)  Go to Austin Miles, MD (Thoracic and Cardiac Surgery) on 12/09/2021; Follow up appointment scheduled with Nurse Practitioner at 9:30 AM. Please bring AVS and vital sign log to clinic appointment.  Call [facility]) in 2 days (12/05/2021); As needed      {[id number]}    Colletta Maryland, LPN

## 2021-12-07 ENCOUNTER — Telehealth: Payer: Self-pay | Admitting: Nurse Practitioner

## 2021-12-07 NOTE — Telephone Encounter (Signed)
Patient is a 55 y/o male who underwent a CABG x4 by Dr. Thedore Mins on 11/29. Patient was discharged on 12/02/2021 to home. Patient calls today, 12/11, for rash that is new today, across his chest. He denies any new medications, soaps, laundry detergent over the past 24 hours. He denies any shortness of breath. Patient was instructed to take PO benadryl for symptom relief. He does have follow up appointment on 12/13 in our office. He was instructed to call back if symptoms worsen.

## 2021-12-08 ENCOUNTER — Telehealth (INDEPENDENT_AMBULATORY_CARE_PROVIDER_SITE_OTHER): Payer: Self-pay

## 2021-12-08 NOTE — Telephone Encounter (Signed)
Date of surgery: 11/25/21  Surgery: CABG x 4 (LIMA-dLAD, SVG- PDA, RIMA-OM1, Lrad-OM2)  Surgeon: Dr.Singh  Discharged: 12/02/21  Postop appointment: 12/09/21    Concern/complaint of: Patient called the [facility] line over the weekend reporting new onset rash across his chest that he noticed on Saturday. NP, Marijean Niemann, recommended patient try benadryl.     I spoke with patient this morning who reports an itchy, blotchy rash across his chest and back. He says he noticed the rash on Saturday morning, 12/07/21, but began itching on Friday. He denies any other symptoms of shortness of breath or swelling.      His VS today: 115/67, HR 77, WT 186 (190 yesterday), T 99.4 (99.2 12/11, 98.8 12/10)    Patient is taking metoprolol + amiodarone for persistent postop ST and atrial tachycardia. He is on maintenance dose of amiodarone 200 mg daily through 12/12/21.     Of note, patient called the office on 12/8 for HR bouncing from 70-120s depending on activity or conversation. He was talk to increase metoprolol to 100 mg (with parameter of SBP >110.)    Below are pictures from today:        Please advise:

## 2021-12-08 NOTE — Telephone Encounter (Signed)
I spoke with NP, Dorene Grebe, who suspects this could be a reaction to amiodarone. She recommends patient discontinue amiodarone.     I called the patient to let him know to stop amiodarone. He should call the office back if itching or rash worsens.

## 2021-12-09 ENCOUNTER — Ambulatory Visit (INDEPENDENT_AMBULATORY_CARE_PROVIDER_SITE_OTHER): Payer: No Typology Code available for payment source | Admitting: Registered Nurse

## 2021-12-09 ENCOUNTER — Encounter (INDEPENDENT_AMBULATORY_CARE_PROVIDER_SITE_OTHER): Payer: Self-pay | Admitting: Registered Nurse

## 2021-12-09 ENCOUNTER — Ambulatory Visit (INDEPENDENT_AMBULATORY_CARE_PROVIDER_SITE_OTHER): Payer: Medicare Other

## 2021-12-09 VITALS — BP 108/70 | HR 72 | Temp 97.5°F | Resp 19 | Ht 70.0 in | Wt 195.6 lb

## 2021-12-09 DIAGNOSIS — R000 Tachycardia, unspecified: Secondary | ICD-10-CM

## 2021-12-09 DIAGNOSIS — Z09 Encounter for follow-up examination after completed treatment for conditions other than malignant neoplasm: Secondary | ICD-10-CM

## 2021-12-09 DIAGNOSIS — T8189XA Other complications of procedures, not elsewhere classified, initial encounter: Secondary | ICD-10-CM

## 2021-12-09 DIAGNOSIS — Z951 Presence of aortocoronary bypass graft: Secondary | ICD-10-CM

## 2021-12-09 DIAGNOSIS — Z9889 Other specified postprocedural states: Secondary | ICD-10-CM

## 2021-12-09 DIAGNOSIS — I4901 Ventricular fibrillation: Secondary | ICD-10-CM | POA: Diagnosis not present

## 2021-12-09 LAB — CUP PACEART REMOTE DEVICE CHECK
Battery Remaining Longevity: 7 mo
Battery Remaining Percentage: 6 %
Battery Voltage: 2.63 V
Brady Statistic RV Percent Paced: 1 %
Date Time Interrogation Session: 20221213045904
HighPow Impedance: 78 Ohm
HighPow Impedance: 78 Ohm
Implantable Lead Implant Date: 20110912
Implantable Lead Location: 753860
Implantable Lead Model: 7122
Implantable Pulse Generator Implant Date: 20110912
Lead Channel Impedance Value: 360 Ohm
Lead Channel Pacing Threshold Amplitude: 0.75 V
Lead Channel Pacing Threshold Pulse Width: 0.5 ms
Lead Channel Sensing Intrinsic Amplitude: 7.3 mV
Lead Channel Setting Pacing Amplitude: 2.5 V
Lead Channel Setting Pacing Pulse Width: 0.5 ms
Lead Channel Setting Sensing Sensitivity: 0.5 mV
Pulse Gen Serial Number: 618386

## 2021-12-09 MED ORDER — DOXYCYCLINE MONOHYDRATE 100 MG PO CAPS
100.0000 mg | ORAL_CAPSULE | Freq: Two times a day (BID) | ORAL | 0 refills | Status: AC
Start: 2021-12-09 — End: 2021-12-16

## 2021-12-09 MED ORDER — TRAMADOL HCL 50 MG PO TABS
50.0000 mg | ORAL_TABLET | Freq: Three times a day (TID) | ORAL | 0 refills | Status: AC | PRN
Start: 2021-12-09 — End: 2021-12-14

## 2021-12-10 ENCOUNTER — Ambulatory Visit (INDEPENDENT_AMBULATORY_CARE_PROVIDER_SITE_OTHER): Payer: No Typology Code available for payment source | Admitting: Specialist

## 2021-12-11 ENCOUNTER — Telehealth: Payer: Self-pay

## 2021-12-11 NOTE — Telephone Encounter (Signed)
Elvina Mattes of [facility] care agency called to advise of Raymond Cook Home physical Therapy is twice a week for 3 weeks

## 2021-12-19 NOTE — Progress Notes (Signed)
Remote ICD transmission.   

## 2022-01-06 ENCOUNTER — Encounter (INDEPENDENT_AMBULATORY_CARE_PROVIDER_SITE_OTHER): Payer: Self-pay

## 2022-01-09 ENCOUNTER — Encounter (INDEPENDENT_AMBULATORY_CARE_PROVIDER_SITE_OTHER): Payer: Self-pay | Admitting: Thoracic Surgery (Cardiothoracic Vascular Surgery)

## 2022-01-23 ENCOUNTER — Telehealth: Payer: Self-pay

## 2022-01-23 NOTE — Telephone Encounter (Signed)
Attempted to contact Patient and/or patient family for follow-up with cardiac surgery outcomes.  Left message with contact information for patient to return call at a later date.

## 2022-01-29 ENCOUNTER — Telehealth: Payer: Self-pay

## 2022-01-29 NOTE — Telephone Encounter (Signed)
How have you been doing since discharge?  Doing very well    Have you been to follow-up appointments?  yes    Have you participated in Cardiac Rehab?  Not yet but will once cleared.     Have you had any admissions to the ER or hospital and if so where, why and for how long?     No

## 2022-02-12 ENCOUNTER — Ambulatory Visit (INDEPENDENT_AMBULATORY_CARE_PROVIDER_SITE_OTHER): Payer: Medicare Other

## 2022-02-12 DIAGNOSIS — I4901 Ventricular fibrillation: Secondary | ICD-10-CM

## 2022-02-12 LAB — CUP PACEART REMOTE DEVICE CHECK
Battery Remaining Longevity: 6 mo
Battery Remaining Percentage: 4 %
Battery Voltage: 2.62 V
Brady Statistic RV Percent Paced: 1 %
Date Time Interrogation Session: 20230216031810
HighPow Impedance: 73 Ohm
HighPow Impedance: 73 Ohm
Implantable Lead Implant Date: 20110912
Implantable Lead Location: 753860
Implantable Lead Model: 7122
Implantable Pulse Generator Implant Date: 20110912
Lead Channel Impedance Value: 350 Ohm
Lead Channel Pacing Threshold Amplitude: 0.75 V
Lead Channel Pacing Threshold Pulse Width: 0.5 ms
Lead Channel Sensing Intrinsic Amplitude: 5.7 mV
Lead Channel Setting Pacing Amplitude: 2.5 V
Lead Channel Setting Pacing Pulse Width: 0.5 ms
Lead Channel Setting Sensing Sensitivity: 0.5 mV
Pulse Gen Serial Number: 618386

## 2022-02-17 NOTE — Progress Notes (Signed)
Remote ICD transmission.   

## 2022-03-10 ENCOUNTER — Ambulatory Visit (INDEPENDENT_AMBULATORY_CARE_PROVIDER_SITE_OTHER): Payer: Medicare Other

## 2022-03-10 DIAGNOSIS — I4901 Ventricular fibrillation: Secondary | ICD-10-CM | POA: Diagnosis not present

## 2022-03-10 LAB — CUP PACEART REMOTE DEVICE CHECK
Battery Remaining Longevity: 4 mo
Battery Remaining Percentage: 3 %
Battery Voltage: 2.6 V
Brady Statistic RV Percent Paced: 1 %
Date Time Interrogation Session: 20230314062611
HighPow Impedance: 75 Ohm
HighPow Impedance: 75 Ohm
Implantable Lead Implant Date: 20110912
Implantable Lead Location: 753860
Implantable Lead Model: 7122
Implantable Pulse Generator Implant Date: 20110912
Lead Channel Impedance Value: 350 Ohm
Lead Channel Pacing Threshold Amplitude: 0.75 V
Lead Channel Pacing Threshold Pulse Width: 0.5 ms
Lead Channel Sensing Intrinsic Amplitude: 7.3 mV
Lead Channel Setting Pacing Amplitude: 2.5 V
Lead Channel Setting Pacing Pulse Width: 0.5 ms
Lead Channel Setting Sensing Sensitivity: 0.5 mV
Pulse Gen Serial Number: 618386

## 2022-03-24 NOTE — Progress Notes (Signed)
Remote ICD transmission.   

## 2022-04-16 ENCOUNTER — Ambulatory Visit (INDEPENDENT_AMBULATORY_CARE_PROVIDER_SITE_OTHER): Payer: Medicare Other

## 2022-04-16 DIAGNOSIS — I4901 Ventricular fibrillation: Secondary | ICD-10-CM

## 2022-04-17 LAB — CUP PACEART REMOTE DEVICE CHECK
Battery Remaining Longevity: 4 mo
Battery Remaining Percentage: 3 %
Battery Voltage: 2.6 V
Brady Statistic RV Percent Paced: 1 %
Date Time Interrogation Session: 20230421095255
HighPow Impedance: 73 Ohm
HighPow Impedance: 73 Ohm
Implantable Lead Implant Date: 20110912
Implantable Lead Location: 753860
Implantable Lead Model: 7122
Implantable Pulse Generator Implant Date: 20110912
Lead Channel Impedance Value: 350 Ohm
Lead Channel Pacing Threshold Amplitude: 0.75 V
Lead Channel Pacing Threshold Pulse Width: 0.5 ms
Lead Channel Sensing Intrinsic Amplitude: 5.5 mV
Lead Channel Setting Pacing Amplitude: 2.5 V
Lead Channel Setting Pacing Pulse Width: 0.5 ms
Lead Channel Setting Sensing Sensitivity: 0.5 mV
Pulse Gen Serial Number: 618386

## 2022-05-04 NOTE — Addendum Note (Signed)
Addended by: Elease Etienne A on: 05/04/2022 11:58 AM ? ? Modules accepted: Level of Service ? ?

## 2022-05-04 NOTE — Progress Notes (Signed)
Remote ICD transmission.   

## 2022-05-18 ENCOUNTER — Ambulatory Visit (INDEPENDENT_AMBULATORY_CARE_PROVIDER_SITE_OTHER): Payer: Medicare Other

## 2022-05-18 DIAGNOSIS — I4901 Ventricular fibrillation: Secondary | ICD-10-CM

## 2022-05-19 LAB — CUP PACEART REMOTE DEVICE CHECK
Battery Remaining Longevity: 1 mo
Battery Remaining Percentage: 2 %
Battery Voltage: 2.6 V
Brady Statistic RV Percent Paced: 1 %
Date Time Interrogation Session: 20230522084640
HighPow Impedance: 75 Ohm
HighPow Impedance: 75 Ohm
Implantable Lead Implant Date: 20110912
Implantable Lead Location: 753860
Implantable Lead Model: 7122
Implantable Pulse Generator Implant Date: 20110912
Lead Channel Impedance Value: 350 Ohm
Lead Channel Pacing Threshold Amplitude: 0.75 V
Lead Channel Pacing Threshold Pulse Width: 0.5 ms
Lead Channel Sensing Intrinsic Amplitude: 6 mV
Lead Channel Setting Pacing Amplitude: 2.5 V
Lead Channel Setting Pacing Pulse Width: 0.5 ms
Lead Channel Setting Sensing Sensitivity: 0.5 mV
Pulse Gen Serial Number: 618386

## 2022-05-29 ENCOUNTER — Ambulatory Visit (HOSPITAL_COMMUNITY): Payer: Medicare Other | Attending: Cardiology

## 2022-05-29 DIAGNOSIS — I4901 Ventricular fibrillation: Secondary | ICD-10-CM | POA: Insufficient documentation

## 2022-05-29 LAB — ECHOCARDIOGRAM COMPLETE
Area-P 1/2: 2.75 cm2
S' Lateral: 3.05 cm

## 2022-05-29 MED ORDER — PERFLUTREN LIPID MICROSPHERE
1.0000 mL | INTRAVENOUS | Status: AC | PRN
  Administered 2022-05-29: 2 mL via INTRAVENOUS

## 2022-06-02 NOTE — Progress Notes (Signed)
Electrophysiology Office Note Date: 06/04/2022  ID:  Adam Orr, DOB 1966-02-22, MRN [id number]  PCP: Adam Reichmann, DO Primary Cardiologist: Adam Bollman, MD Electrophysiologist: Adam Range, MD   CC: Routine ICD follow-up  Adam Orr is a 56 y.o. male seen today for Adam Range, MD for routine electrophysiology followup.    Since last being seen in clinic, the pt reports doing very well. He denies symptoms of palpitations, chest pain, shortness of breath, orthopnea, PND, lower extremity edema, claudication, dizziness, presyncope, syncope, bleeding, or neurologic sequela. The patient is tolerating medications without difficulties.    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 (doesn't say where LV goes to)    VF (ventricular fibrillation) (HCC)    arrest   Past Surgical History:  Procedure Laterality Date   ICD implantation     with defibrillation threshold testing. St. Jude    Current Outpatient Medications  Medication Sig Dispense Refill   aspirin 81 MG chewable tablet Chew 81 mg by mouth daily.     Cholecalciferol (VITAMIN D-3) 5000 UNITS TABS Take 5,000 Units by mouth daily.      citalopram (CELEXA) 20 MG tablet Take 1 tablet (20 mg total) by mouth daily.     Divalproex Sodium (DEPAKOTE PO) Take 125 mg by mouth daily.      folic acid (FOLVITE) 1 MG tablet Take 1 tablet (1 mg total) by mouth daily.     levothyroxine (SYNTHROID) 75 MCG tablet Take 1 tablet by mouth daily.     LORazepam (ATIVAN) 0.5 MG tablet Take 0.5 mg by mouth daily.      Multiple Vitamin (MULTIVITAMIN) tablet Take 1 tablet by mouth daily.     simvastatin (ZOCOR) 5  MG tablet Take 1 tablet (5 mg total) by mouth daily. 30 tablet 11   terazosin (HYTRIN) 1 MG capsule Take 1 mg by mouth daily.      Thiamine HCl (VITAMIN B-1) 250 MG tablet Take 250 mg by mouth daily.     vitamin B-12 (CYANOCOBALAMIN) 1000 MCG tablet Take 1,000 mcg by mouth daily.     No current facility-administered medications for this visit.    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   Vaping Use   Vaping Use: Never used  Substance and Sexual Activity   Alcohol use: Yes    Alcohol/week: 84.0 standard drinks of alcohol    Types: 84 Cans of beer per week   Drug use: No  Sexual activity: Not on file  Other Topics Concern   Not on file  Social History Narrative   Divorced, 2 children.    Patient lives at home with his mother Adam Orr.   Patient was in [employer] for 12 years.    Left-handed.   Social Determinants of Health   Financial Resource Strain: Not on file  Food Insecurity: Not on file  Transportation Needs: Not on file  Physical Activity: Not on file  Stress: Not on file  Social Connections: Not on file  Intimate Partner Violence: Not on file    Family History: Family History  Problem Relation Age of Onset   Coronary artery disease Mother    Prostate cancer Father    Prostate cancer Paternal Grandfather     Review of systems complete and found to be negative unless listed in  HPI.     Physical Exam: Vitals:   06/04/22 1032  BP: 100/60  Pulse: 72  SpO2: 97%  Weight: 231 lb (104.8 kg)  Height: 6\' 3"  (1.905 m)      General:  Well appearing. No resp difficulty. HEENT: Normal Neck: Supple. JVP 5-6. Carotids 2+ bilat; no bruits. No thyromegaly or nodule noted. Cor: PMI nondisplaced. RRR, No M/G/R noted Lungs: CTAB, normal effort. Abdomen: Soft, non-tender, non-distended, no HSM. No bruits or masses. +BS   Extremities: No cyanosis, clubbing, or rash. R and LLE no edema.  Neuro: Alert & orientedx3, cranial nerves grossly intact. moves all 4 extremities w/o difficulty. Affect pleasant   ICD interrogation- reviewed in detail today,  See PACEART report  EKG:  EKG is ordered today. Personal review of EKG ordered today shows NSR at 72 bpm  Recent Labs: No results found for requested labs within last 365 days.   Wt Readings from Last 3 Encounters:  06/04/22 231 lb (104.8 kg)  09/02/21 224 lb (101.6 kg)  08/23/20 217 lb (98.4 kg)     Other studies Reviewed: Additional studies/ records that were reviewed today include: Previous EP office notes   Assessment and Plan:  1. 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 Continue monthly checks.  Echo 05/29/2022 LVEF 60-65%, grade 1 DD  2. Coronary Vasospasm and Non obstructive CAD Denies s/s ischemia Continue ASA and statin  Disposition:  He is very near to ERI. Will discuss plan with MD and act further once ERI alert is received.  He may be able to be scheduled once he hits ERI if Adam Orr is able to do.    Adam Frame, PA-C  06/04/2022 12:58 PM  [facility] [address] [place] [phone] (office) [phone] (fax)

## 2022-06-04 ENCOUNTER — Ambulatory Visit (INDEPENDENT_AMBULATORY_CARE_PROVIDER_SITE_OTHER): Payer: Medicare Other | Admitting: Student

## 2022-06-04 ENCOUNTER — Encounter: Payer: Self-pay | Admitting: Student

## 2022-06-04 VITALS — BP 100/60 | HR 72 | Ht 75.0 in | Wt 231.0 lb

## 2022-06-04 DIAGNOSIS — I4901 Ventricular fibrillation: Secondary | ICD-10-CM | POA: Diagnosis not present

## 2022-06-04 DIAGNOSIS — I201 Angina pectoris with documented spasm: Secondary | ICD-10-CM

## 2022-06-04 LAB — CUP PACEART INCLINIC DEVICE CHECK
Battery Remaining Longevity: 2 mo
Brady Statistic RV Percent Paced: 0.01 %
Date Time Interrogation Session: 20230608130133
HighPow Impedance: 77.625
Implantable Lead Implant Date: 20110912
Implantable Lead Location: 753860
Implantable Lead Model: 7122
Implantable Pulse Generator Implant Date: 20110912
Lead Channel Impedance Value: 362.5 Ohm
Lead Channel Pacing Threshold Amplitude: 1 V
Lead Channel Pacing Threshold Amplitude: 1 V
Lead Channel Pacing Threshold Pulse Width: 0.5 ms
Lead Channel Pacing Threshold Pulse Width: 0.5 ms
Lead Channel Sensing Intrinsic Amplitude: 10.1 mV
Lead Channel Setting Pacing Amplitude: 2.5 V
Lead Channel Setting Pacing Pulse Width: 0.5 ms
Lead Channel Setting Sensing Sensitivity: 0.5 mV
Pulse Gen Serial Number: 618386

## 2022-06-04 NOTE — Patient Instructions (Signed)
Medication Instructions:  Your physician recommends that you continue on your current medications as directed. Please refer to the Current Medication list given to you today.  *If you need a refill on your cardiac medications before your next appointment, please call your pharmacy*   Lab Work: None If you have labs (blood work) drawn today and your tests are completely normal, you will receive your results only by: [place] (if you have MyChart) OR A paper copy in the mail If you have any lab test that is abnormal or we need to change your treatment, we will call you to review the results.   Follow-Up: At [facility], you and your health needs are our priority.  As part of our continuing mission to provide you with exceptional heart care, we have created designated Provider Care Teams.  These Care Teams include your primary Cardiologist (physician) and Advanced Practice Providers (APPs -  Physician Assistants and Nurse Practitioners) who all work together to provide you with the care you need, when you need it.  We recommend signing up for the patient portal called "MyChart".  Sign up information is provided on this After Visit Summary.  MyChart is used to connect with patients for Virtual Visits (Telemedicine).  Patients are able to view lab/test results, encounter notes, upcoming appointments, etc.  Non-urgent messages can be sent to your provider as well.   To learn more about what you can do with MyChart, go to [email or web address].    Your next appointment:   We will contact you when we receive the battery alert for your device

## 2022-06-04 NOTE — Addendum Note (Signed)
Addended by: Geralyn Flash D on: 06/04/2022 11:27 AM   Modules accepted: Level of Service

## 2022-06-04 NOTE — Progress Notes (Signed)
Remote ICD transmission.   

## 2022-06-15 ENCOUNTER — Telehealth: Payer: Self-pay

## 2022-06-15 NOTE — Telephone Encounter (Signed)
[facility] alert, device reached ERI 6/16.  Spoke to patients mother per [facility] and advised device has reached ERI. Apt currently in place for august with Dr. Elberta Fortis  to discuss gen change.  Unsure if patient needs another apt to dsicuss gen change or is patient okay to wait for that apt? Advised mother someone will reach out once more information is available.

## 2022-06-18 ENCOUNTER — Ambulatory Visit (INDEPENDENT_AMBULATORY_CARE_PROVIDER_SITE_OTHER): Payer: Medicare Other

## 2022-06-18 DIAGNOSIS — I4901 Ventricular fibrillation: Secondary | ICD-10-CM

## 2022-06-21 LAB — CUP PACEART REMOTE DEVICE CHECK
Battery Remaining Longevity: 0 mo
Battery Voltage: 2.59 V
Brady Statistic RV Percent Paced: 1 %
Date Time Interrogation Session: 20230623132530
HighPow Impedance: 80 Ohm
HighPow Impedance: 80 Ohm
Implantable Lead Implant Date: 20110912
Implantable Lead Location: 753860
Implantable Lead Model: 7122
Implantable Pulse Generator Implant Date: 20110912
Lead Channel Impedance Value: 360 Ohm
Lead Channel Pacing Threshold Amplitude: 1 V
Lead Channel Pacing Threshold Pulse Width: 0.5 ms
Lead Channel Sensing Intrinsic Amplitude: 9.1 mV
Lead Channel Setting Pacing Amplitude: 2.5 V
Lead Channel Setting Pacing Pulse Width: 0.5 ms
Lead Channel Setting Sensing Sensitivity: 0.5 mV
Pulse Gen Serial Number: 618386

## 2022-06-25 NOTE — Progress Notes (Signed)
Remote ICD transmission.   

## 2022-07-07 ENCOUNTER — Telehealth: Payer: Self-pay | Admitting: *Deleted

## 2022-07-07 NOTE — Telephone Encounter (Signed)
error 

## 2022-07-07 NOTE — Telephone Encounter (Signed)
Spoke to pt's mother, dpr on file (pt has dementia). Scheduled gen change for 8/22. They would like to keep appt w/ Dr. Elberta Fortis on 8/14 to meet him prior to the procedure. Aware we will get blood work, if needed at 8/14 OV and give procedure instructions that day. Mother verbalized understanding and agreeable to plan.

## 2022-07-20 ENCOUNTER — Ambulatory Visit (INDEPENDENT_AMBULATORY_CARE_PROVIDER_SITE_OTHER): Payer: Medicare Other

## 2022-07-20 DIAGNOSIS — I4901 Ventricular fibrillation: Secondary | ICD-10-CM

## 2022-07-23 LAB — CUP PACEART REMOTE DEVICE CHECK
Battery Remaining Longevity: 0 mo
Battery Voltage: 2.59 V
Brady Statistic RV Percent Paced: 1 %
Date Time Interrogation Session: 20230726105025
HighPow Impedance: 75 Ohm
HighPow Impedance: 75 Ohm
Implantable Lead Implant Date: 20110912
Implantable Lead Location: 753860
Implantable Lead Model: 7122
Implantable Pulse Generator Implant Date: 20110912
Lead Channel Impedance Value: 350 Ohm
Lead Channel Pacing Threshold Amplitude: 1 V
Lead Channel Pacing Threshold Pulse Width: 0.5 ms
Lead Channel Sensing Intrinsic Amplitude: 5.5 mV
Lead Channel Setting Pacing Amplitude: 2.5 V
Lead Channel Setting Pacing Pulse Width: 0.5 ms
Lead Channel Setting Sensing Sensitivity: 0.5 mV
Pulse Gen Serial Number: 618386

## 2022-08-10 ENCOUNTER — Ambulatory Visit (INDEPENDENT_AMBULATORY_CARE_PROVIDER_SITE_OTHER): Payer: Medicare Other | Admitting: Cardiology

## 2022-08-10 ENCOUNTER — Encounter: Payer: Self-pay | Admitting: *Deleted

## 2022-08-10 ENCOUNTER — Encounter: Payer: Self-pay | Admitting: Cardiology

## 2022-08-10 VITALS — BP 108/76 | HR 60 | Ht 75.0 in | Wt 231.0 lb

## 2022-08-10 DIAGNOSIS — I4901 Ventricular fibrillation: Secondary | ICD-10-CM | POA: Diagnosis not present

## 2022-08-10 DIAGNOSIS — Z01812 Encounter for preprocedural laboratory examination: Secondary | ICD-10-CM | POA: Diagnosis not present

## 2022-08-10 DIAGNOSIS — I201 Angina pectoris with documented spasm: Secondary | ICD-10-CM

## 2022-08-10 DIAGNOSIS — Z4502 Encounter for adjustment and management of automatic implantable cardiac defibrillator: Secondary | ICD-10-CM | POA: Diagnosis not present

## 2022-08-10 LAB — CUP PACEART INCLINIC DEVICE CHECK
Battery Remaining Longevity: 0 mo
Brady Statistic RV Percent Paced: 0.05 %
Date Time Interrogation Session: 20230814121559
HighPow Impedance: 78.75 Ohm
Implantable Lead Implant Date: 20110912
Implantable Lead Location: 753860
Implantable Lead Model: 7122
Implantable Pulse Generator Implant Date: 20110912
Lead Channel Impedance Value: 362.5 Ohm
Lead Channel Pacing Threshold Amplitude: 1 V
Lead Channel Pacing Threshold Amplitude: 1 V
Lead Channel Pacing Threshold Pulse Width: 0.5 ms
Lead Channel Pacing Threshold Pulse Width: 0.5 ms
Lead Channel Sensing Intrinsic Amplitude: 8.2 mV
Lead Channel Setting Pacing Amplitude: 2.5 V
Lead Channel Setting Pacing Pulse Width: 0.5 ms
Lead Channel Setting Sensing Sensitivity: 0.5 mV
Pulse Gen Serial Number: 618386

## 2022-08-10 LAB — BASIC METABOLIC PANEL
BUN/Creatinine Ratio: 9 (ref 9–20)
BUN: 10 mg/dL (ref 6–24)
CO2: 23 mmol/L (ref 20–29)
Calcium: 9.9 mg/dL (ref 8.7–10.2)
Chloride: 106 mmol/L (ref 96–106)
Creatinine, Ser: 1.13 mg/dL (ref 0.76–1.27)
Glucose: 97 mg/dL (ref 70–99)
Potassium: 4.3 mmol/L (ref 3.5–5.2)
Sodium: 139 mmol/L (ref 134–144)
eGFR: 76 mL/min/{1.73_m2} (ref >59)

## 2022-08-10 LAB — CBC
Hematocrit: 43 % (ref 37.5–51.0)
Hemoglobin: 14.8 g/dL (ref 13.0–17.7)
MCH: 30.6 pg (ref 26.6–33.0)
MCHC: 34.4 g/dL (ref 31.5–35.7)
MCV: 89 fL (ref 79–97)
Platelets: 217 10*3/uL (ref 150–450)
RBC: 4.83 x10E6/uL (ref 4.14–5.80)
RDW: 13.9 % (ref 11.6–15.4)
WBC: 5.4 10*3/uL (ref 3.4–10.8)

## 2022-08-10 NOTE — H&P (View-Only) (Signed)
 Electrophysiology Office Note   Date:  08/10/2022   ID:  Adam Orr, DOB 01/12/1966, MRN [id number]  PCP:  Collins, Dana, DO  Cardiologist:  [place] Primary Electrophysiologist:  Adam Orr Adam Yassin, MD    Chief Complaint: ICD   History of Present Illness: Adam Orr is a 56 y.o. male who is being seen today for the evaluation of ICD at the request of Collins, Dana, DO. Presenting today for electrophysiology evaluation.  Has a history sooner for nonobstructive coronary artery disease.  He has a Saint Jude single-chamber ICD implanted 09/08/2010 for secondary prevention due to an aborted VF arrest.  Today, he denies symptoms of palpitations, chest pain, shortness of breath, orthopnea, PND, lower extremity edema, claudication, dizziness, presyncope, syncope, bleeding, or neurologic sequela. The patient is tolerating medications without difficulties.    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 (doesn't say where LV goes to)    VF (ventricular fibrillation) (HCC)    arrest   Past Surgical History:  Procedure Laterality Date   ICD implantation     with defibrillation threshold testing. St. Jude     Current Outpatient Medications  Medication Sig Dispense Refill   aspirin 81 MG chewable tablet Chew 81 mg by mouth daily.     Cholecalciferol (VITAMIN D-3) 5000 UNITS TABS Take 5,000 Units by mouth daily.      citalopram (CELEXA) 20 MG tablet Take 1 tablet (20 mg total) by mouth daily.     Divalproex Sodium (DEPAKOTE PO) Take 125 mg by mouth daily.      folic acid (FOLVITE) 1 MG tablet Take 1 tablet (1 mg total) by mouth daily.     levothyroxine (SYNTHROID) 75 MCG tablet Take 1 tablet by mouth daily.     LORazepam (ATIVAN) 0.5 MG tablet Take 0.5 mg by  mouth daily.      Multiple Vitamin (MULTIVITAMIN) tablet Take 1 tablet by mouth daily.     simvastatin (ZOCOR) 5 MG tablet Take 1 tablet (5 mg total) by mouth daily. 30 tablet 11   terazosin (HYTRIN) 1 MG capsule Take 1 mg by mouth daily.      Thiamine HCl (VITAMIN B-1) 250 MG tablet Take 250 mg by mouth daily.     vitamin B-12 (CYANOCOBALAMIN) 1000 MCG tablet Take 1,000 mcg by mouth daily.     No current facility-administered medications for this visit.    Allergies:   Penicillins, Mirtazapine, Smallpox virus vaccine live, Melatonin, and Namenda [memantine hcl]   Social History:  The patient  reports that he has quit smoking. His smoking use included cigarettes. He started smoking about 12 years ago. He has never used smokeless tobacco. He reports current alcohol use of about 84.0 standard drinks of alcohol per week. He reports that he does not use drugs.   Family History:  The patient's family history includes Coronary artery disease in his mother; Prostate cancer in his father and paternal grandfather.    ROS:  Please see the history of present illness.   Otherwise, review of systems is positive for none.   All other systems are reviewed and negative.    PHYSICAL EXAM: VS:  BP 108/76   Pulse 60   Ht 6' 3" (  1.905 m)   Wt 231 lb (104.8 kg)   SpO2 94%   BMI 28.87 kg/m  , BMI Body mass index is 28.87 kg/m. GEN: Well nourished, well developed, in no acute distress  HEENT: normal  Neck: no JVD, carotid bruits, or masses Cardiac: RRR; no murmurs, rubs, or gallops,no edema  Respiratory:  clear to auscultation bilaterally, normal work of breathing GI: soft, nontender, nondistended, + BS MS: no deformity or atrophy  Skin: warm and dry, device pocket is well healed Neuro:  Strength and sensation are intact Psych: euthymic mood, full affect  EKG:  EKG is ordered today. Personal review of the ekg ordered shows sinus rhythm, rate 60  Device interrogation is reviewed today in detail.   See PaceArt for details.   Recent Labs: No results found for requested labs within last 365 days.    Lipid Panel     Component Value Date/Time   CHOL 203 (H) 11/24/2010 2142   TRIG 195 (H) 11/24/2010 2142   HDL 54 11/24/2010 2142   CHOLHDL 3.8 Ratio 11/24/2010 2142   VLDL 39 11/24/2010 2142   LDLCALC 110 (H) 11/24/2010 2142     Wt Readings from Last 3 Encounters:  08/10/22 231 lb (104.8 kg)  06/04/22 231 lb (104.8 kg)  09/02/21 224 lb (101.6 kg)      Other studies Reviewed: Additional studies/ records that were reviewed today include: TTE 05/29/22  Review of the above records today demonstrates:   1. Left ventricular ejection fraction, by estimation, is 60 to 65%. The  left ventricle has normal function. The left ventricle has no regional  wall motion abnormalities. Left ventricular diastolic parameters are  consistent with Grade I diastolic  dysfunction (impaired relaxation).   2. Right ventricular systolic function is normal. The right ventricular  size is normal.   3. The mitral valve is normal in structure. No evidence of mitral valve  regurgitation. No evidence of mitral stenosis. Moderate mitral annular  calcification.   4. The aortic valve is normal in structure. Aortic valve regurgitation is  not visualized. No aortic stenosis is present.   5. The inferior vena cava is normal in size with greater than 50%  respiratory variability, suggesting right atrial pressure of 3 mmHg.    ASSESSMENT AND PLAN:  1.  VF arrest: Status post Saint Jude single-chamber ICD.  Device functioning appropriately.  Device is at [facility].  We Jazae Gandolfi plan for generator change.  Risk and benefits were discussed which include bleeding and infection.  He understands these risks and is agreed to the procedure.  2.  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  Continue aspirin and statin per primary cardiology.  3.  ICD at ERI: We Catrice Zuleta plan for generator change as  above.  Current medicines are reviewed at length with the patient today.   The patient does not have concerns regarding his medicines.  The following changes were made today:  none  Labs/ tests ordered today include:  Orders Placed This Encounter  Procedures   Basic metabolic panel   CBC   CUP PACEART INCLINIC DEVICE CHECK   EKG 12-Lead     Disposition:   FU with Sharmayne Jablon 3 months  Signed, Luqman Perrelli Jorja Loa, MD  08/10/2022 12:26 PM     [facility] [address] [place] [address] [phone] (office) [phone] (fax)

## 2022-08-10 NOTE — Patient Instructions (Signed)
Medication Instructions:  Your physician recommends that you continue on your current medications as directed. Please refer to the Current Medication list given to you today.  *If you need a refill on your cardiac medications before your next appointment, please call your pharmacy*   Lab Work: Pre procedure lab work today: BMET & CBC  If you have labs (blood work) drawn today and your tests are completely normal, you will receive your results only by: MyChart Message (if you have MyChart) OR A paper copy in the mail If you have any lab test that is abnormal or we need to change your treatment, we will call you to review the results.   Testing/Procedures: You are scheduled for a ICD battery change on 08/19/2022.  Please see instruction sheet given to you today.   Follow-Up: At [facility], you and your health needs are our priority.  As part of our continuing mission to provide you with exceptional heart care, we have created designated Provider Care Teams.  These Care Teams include your primary Cardiologist (physician) and Advanced Practice Providers (APPs -  Physician Assistants and Nurse Practitioners) who all work together to provide you with the care you need, when you need it.  We recommend signing up for the patient portal called "MyChart".  Sign up information is provided on this After Visit Summary.  MyChart is used to connect with patients for Virtual Visits (Telemedicine).  Patients are able to view lab/test results, encounter notes, upcoming appointments, etc.  Non-urgent messages can be sent to your provider as well.   To learn more about what you can do with MyChart, go to [email or web address].    Your next appointment:   2 week(s) after your battery change  The format for your next appointment:   In Person  Provider:   Device clinic for a wound check {   Thank you for choosing [facility]!!   Dory Horn, RN [phone]  Other  Instructions   Important Information About Sugar

## 2022-08-10 NOTE — Progress Notes (Signed)
Electrophysiology Office Note   Date:  08/10/2022   ID:  Adam Orr, DOB 1966/10/09, MRN [id number]  PCP:  Irena Reichmann, DO  Cardiologist:  Excell Seltzer Primary Electrophysiologist:  Daliah Chaudoin Jorja Loa, MD    Chief Complaint: ICD   History of Present Illness: Adam Orr is a 56 y.o. male who is being seen today for the evaluation of ICD at the request of Irena Reichmann, DO. Presenting today for electrophysiology evaluation.  Has a history sooner for nonobstructive coronary artery disease.  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  Today, he denies symptoms of palpitations, chest pain, shortness of breath, orthopnea, PND, lower extremity edema, claudication, dizziness, presyncope, syncope, bleeding, or neurologic sequela. The patient is tolerating medications without difficulties.    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 (doesn't say where LV goes to)    VF (ventricular fibrillation) (HCC)    arrest   Past Surgical History:  Procedure Laterality Date   ICD implantation     with defibrillation threshold testing. [employer]     Current Outpatient Medications  Medication Sig Dispense Refill   aspirin 81 MG chewable tablet Chew 81 mg by mouth daily.     Cholecalciferol (VITAMIN D-3) 5000 UNITS TABS Take 5,000 Units by mouth daily.      citalopram (CELEXA) 20 MG tablet Take 1 tablet (20 mg total) by mouth daily.     Divalproex Sodium (DEPAKOTE PO) Take 125 mg by mouth daily.      folic acid (FOLVITE) 1 MG tablet Take 1 tablet (1 mg total) by mouth daily.     levothyroxine (SYNTHROID) 75 MCG tablet Take 1 tablet by mouth daily.     LORazepam (ATIVAN) 0.5 MG tablet Take 0.5 mg by  mouth daily.      Multiple Vitamin (MULTIVITAMIN) tablet Take 1 tablet by mouth daily.     simvastatin (ZOCOR) 5 MG tablet Take 1 tablet (5 mg total) by mouth daily. 30 tablet 11   terazosin (HYTRIN) 1 MG capsule Take 1 mg by mouth daily.      Thiamine HCl (VITAMIN B-1) 250 MG tablet Take 250 mg by mouth daily.     vitamin B-12 (CYANOCOBALAMIN) 1000 MCG tablet Take 1,000 mcg by mouth daily.     No current facility-administered medications for this visit.    Allergies:   Penicillins, Mirtazapine, Smallpox virus vaccine live, Melatonin, and Namenda [memantine hcl]   Social History:  The patient  reports that he has quit smoking. His smoking use included cigarettes. He started smoking about 12 years ago. He has never used smokeless tobacco. He reports current alcohol use of about 84.0 standard drinks of alcohol per week. He reports that he does not use drugs.   Family History:  The patient's family history includes Coronary artery disease in his mother; Prostate cancer in his father and paternal grandfather.    ROS:  Please see the history of present illness.   Otherwise, review of systems is positive for none.   All other systems are reviewed and negative.    PHYSICAL EXAM: VS:  BP 108/76   Pulse 60   Ht 6\' 3"  (  1.905 m)   Wt 231 lb (104.8 kg)   SpO2 94%   BMI 28.87 kg/m  , BMI Body mass index is 28.87 kg/m. GEN: Well nourished, well developed, in no acute distress  HEENT: normal  Neck: no JVD, carotid bruits, or masses Cardiac: RRR; no murmurs, rubs, or gallops,no edema  Respiratory:  clear to auscultation bilaterally, normal work of breathing GI: soft, nontender, nondistended, + BS MS: no deformity or atrophy  Skin: warm and dry, device pocket is well healed Neuro:  Strength and sensation are intact Psych: euthymic mood, full affect  EKG:  EKG is ordered today. Personal review of the ekg ordered shows sinus rhythm, rate 60  Device interrogation is reviewed today in detail.   See PaceArt for details.   Recent Labs: No results found for requested labs within last 365 days.    Lipid Panel     Component Value Date/Time   CHOL 203 (H) 11/24/2010 2142   TRIG 195 (H) 11/24/2010 2142   HDL 54 11/24/2010 2142   CHOLHDL 3.8 Ratio 11/24/2010 2142   VLDL 39 11/24/2010 2142   LDLCALC 110 (H) 11/24/2010 2142     Wt Readings from Last 3 Encounters:  08/10/22 231 lb (104.8 kg)  06/04/22 231 lb (104.8 kg)  09/02/21 224 lb (101.6 kg)      Other studies Reviewed: Additional studies/ records that were reviewed today include: TTE 05/29/22  Review of the above records today demonstrates:   1. Left ventricular ejection fraction, by estimation, is 60 to 65%. The  left ventricle has normal function. The left ventricle has no regional  wall motion abnormalities. Left ventricular diastolic parameters are  consistent with Grade I diastolic  dysfunction (impaired relaxation).   2. Right ventricular systolic function is normal. The right ventricular  size is normal.   3. The mitral valve is normal in structure. No evidence of mitral valve  regurgitation. No evidence of mitral stenosis. Moderate mitral annular  calcification.   4. The aortic valve is normal in structure. Aortic valve regurgitation is  not visualized. No aortic stenosis is present.   5. The inferior vena cava is normal in size with greater than 50%  respiratory variability, suggesting right atrial pressure of 3 mmHg.    ASSESSMENT AND PLAN:  1.  VF arrest: Status post [employer] Jude single-chamber ICD.  Device functioning appropriately.  Device is at [facility].  We [name] plan for generator change.  Risk and benefits were discussed which include bleeding and infection.  He understands these risks and is agreed to the procedure.  2.  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  Continue aspirin and statin per primary cardiology.  3.  ICD at ERI: We [name] plan for generator change as  above.  Current medicines are reviewed at length with the patient today.   The patient does not have concerns regarding his medicines.  The following changes were made today:  none  Labs/ tests ordered today include:  Orders Placed This Encounter  Procedures   Basic metabolic panel   CBC   CUP PACEART INCLINIC DEVICE CHECK   EKG 12-Lead     Disposition:   FU with Aydan Levitz 3 months  Signed, Kerrianne Jeng Jorja Loa, MD  08/10/2022 12:26 PM     [facility] [address] [place] [address] [phone] (office) [phone] (fax)

## 2022-08-17 DIAGNOSIS — E538 Deficiency of other specified B group vitamins: Secondary | ICD-10-CM | POA: Diagnosis not present

## 2022-08-17 DIAGNOSIS — R7309 Other abnormal glucose: Secondary | ICD-10-CM | POA: Diagnosis not present

## 2022-08-17 DIAGNOSIS — Z79899 Other long term (current) drug therapy: Secondary | ICD-10-CM | POA: Diagnosis not present

## 2022-08-17 DIAGNOSIS — E039 Hypothyroidism, unspecified: Secondary | ICD-10-CM | POA: Diagnosis not present

## 2022-08-17 DIAGNOSIS — I2510 Atherosclerotic heart disease of native coronary artery without angina pectoris: Secondary | ICD-10-CM | POA: Diagnosis not present

## 2022-08-17 DIAGNOSIS — Z125 Encounter for screening for malignant neoplasm of prostate: Secondary | ICD-10-CM | POA: Diagnosis not present

## 2022-08-17 DIAGNOSIS — E559 Vitamin D deficiency, unspecified: Secondary | ICD-10-CM | POA: Diagnosis not present

## 2022-08-18 DIAGNOSIS — I4901 Ventricular fibrillation: Secondary | ICD-10-CM

## 2022-08-18 NOTE — Pre-Procedure Instructions (Signed)
Attempted to call patient regarding procedure instructions.  Left voicemail  on the following items: Arrival time 1430  may have a light breakfast before 8AM No meds AM of procedure Responsible person to drive you home and stay with you for 24 hrs Wash with special soap night before and morning of procedure

## 2022-08-19 ENCOUNTER — Encounter (HOSPITAL_COMMUNITY)
Admission: RE | Disposition: A | Discharge status: Home / Self Care | Payer: Self-pay | Source: Home / Self Care | Attending: Cardiology

## 2022-08-19 ENCOUNTER — Ambulatory Visit (HOSPITAL_COMMUNITY)
Admission: RE | Admit: 2022-08-19 | Discharge: 2022-08-19 | Disposition: A | Discharge status: Home / Self Care | Payer: Medicare Other | Attending: Cardiology | Admitting: Cardiology

## 2022-08-19 ENCOUNTER — Other Ambulatory Visit: Payer: Self-pay

## 2022-08-19 DIAGNOSIS — I4901 Ventricular fibrillation: Secondary | ICD-10-CM | POA: Diagnosis not present

## 2022-08-19 DIAGNOSIS — Z7982 Long term (current) use of aspirin: Secondary | ICD-10-CM | POA: Insufficient documentation

## 2022-08-19 DIAGNOSIS — I25111 Atherosclerotic heart disease of native coronary artery with angina pectoris with documented spasm: Secondary | ICD-10-CM | POA: Insufficient documentation

## 2022-08-19 DIAGNOSIS — Z87891 Personal history of nicotine dependence: Secondary | ICD-10-CM | POA: Insufficient documentation

## 2022-08-19 DIAGNOSIS — Z4502 Encounter for adjustment and management of automatic implantable cardiac defibrillator: Secondary | ICD-10-CM | POA: Insufficient documentation

## 2022-08-19 HISTORY — PX: ICD GENERATOR CHANGEOUT: EP1231

## 2022-08-19 SURGERY — ICD GENERATOR CHANGEOUT

## 2022-08-19 MED ORDER — LIDOCAINE HCL (PF) 1 % IJ SOLN
INTRAMUSCULAR | Status: DC | PRN
  Administered 2022-08-19: 60 mL

## 2022-08-19 MED ORDER — CHLORHEXIDINE GLUCONATE 4 % EX LIQD: 4.0000 | Freq: Once | CUTANEOUS | Status: DC

## 2022-08-19 MED ORDER — ONDANSETRON HCL 4 MG/2ML IJ SOLN: 4.0000 mg | Freq: Four times a day (QID) | INTRAMUSCULAR | Status: DC | PRN

## 2022-08-19 MED ORDER — ACETAMINOPHEN 325 MG PO TABS: 325.0000 mg | ORAL_TABLET | ORAL | Status: DC | PRN

## 2022-08-19 MED ORDER — SODIUM CHLORIDE 0.9 % IV SOLN
80.0000 mg | INTRAVENOUS | Status: AC
  Administered 2022-08-19: 80 mg

## 2022-08-19 MED ORDER — VANCOMYCIN HCL IN DEXTROSE 1-5 GM/200ML-% IV SOLN
INTRAVENOUS | Status: AC
  Filled 2022-08-19: qty 200

## 2022-08-19 MED ORDER — GENTAMICIN SULFATE 40 MG/ML IJ SOLN
INTRAMUSCULAR | Status: AC
  Filled 2022-08-19: qty 2

## 2022-08-19 MED ORDER — LIDOCAINE HCL (PF) 1 % IJ SOLN
INTRAMUSCULAR | Status: AC
  Filled 2022-08-19: qty 60

## 2022-08-19 MED ORDER — SODIUM CHLORIDE 0.9 % IV SOLN: INTRAVENOUS | Status: DC

## 2022-08-19 MED ORDER — VANCOMYCIN HCL IN DEXTROSE 1-5 GM/200ML-% IV SOLN
1000.0000 mg | INTRAVENOUS | Status: AC
  Administered 2022-08-19: 1000 mg via INTRAVENOUS

## 2022-08-19 SURGICAL SUPPLY — 4 items
CABLE SURGICAL S-101-97-12 (CABLE) ×1 IMPLANT
ICD GALLANT VR CDVRA500Q (ICD Generator) IMPLANT
PAD DEFIB RADIO PHYSIO CONN (PAD) ×1 IMPLANT
TRAY PACEMAKER INSERTION (PACKS) ×1 IMPLANT

## 2022-08-19 NOTE — Interval H&P Note (Signed)
History and Physical Interval Note:  08/19/2022 1:53 PM  Adam Orr  has presented today for surgery, with the diagnosis of eri - vfib.  The various methods of treatment have been discussed with the patient and family. After consideration of risks, benefits and other options for treatment, the patient has consented to  Procedure(s): ICD GENERATOR CHANGEOUT (N/A) as a surgical intervention.  The patient's history has been reviewed, patient examined, no change in status, stable for surgery.  I have reviewed the patient's chart and labs.  Questions were answered to the patient's satisfaction.     Alajiah Dutkiewicz Jorja Loa  ICD Criteria  Current LVEF:60-65%. Within 12 months prior to implant: Yes   Heart failure history: No  Cardiomyopathy history: No.  Atrial Fibrillation/Atrial Flutter: No.  Ventricular tachycardia history: No.  Cardiac arrest history: Yes, Ventricular Fibrillation.  History of syndromes with risk of sudden death: No.  Previous ICD: Yes, Reason for ICD:  Secondary prevention.  Current ICD indication: Secondary  PPM indication: No.  Class I or II Bradycardia indication present: No  Beta Blocker therapy for 3 or more months: No, medical reason.  Ace Inhibitor/ARB therapy for 3 or more months: No, medical reason.   I have seen Adam Orr is a 56 y.o. malepre-procedural and has been referred by Excell Seltzer for consideration of ICD implant for secondary prevention of sudden death.  The patient's chart has been reviewed and they meet criteria for ICD implant.  I have had a thorough discussion with the patient reviewing options.  The patient and their family (if available) have had opportunities to ask questions and have them answered. The patient and I have decided together through the [facility] Share Decision Support Tool to gen change ICD at this time.  Risks, benefits, alternatives to ICD implantation were discussed in detail with the patient today. The patient   understands that the risks include but are not limited to bleeding, infection, pneumothorax, perforation, tamponade, vascular damage, renal failure, MI, stroke, death, inappropriate shocks, and lead dislodgement and wishes to proceed.

## 2022-08-20 ENCOUNTER — Encounter (HOSPITAL_COMMUNITY): Payer: Self-pay | Admitting: Cardiology

## 2022-08-20 NOTE — Progress Notes (Signed)
Remote ICD transmission.   

## 2022-08-20 NOTE — Addendum Note (Signed)
Addended by: Geralyn Flash D on: 08/20/2022 12:57 PM   Modules accepted: Level of Service

## 2022-08-24 DIAGNOSIS — F04 Amnestic disorder due to known physiological condition: Secondary | ICD-10-CM | POA: Diagnosis not present

## 2022-08-24 DIAGNOSIS — Z0000 Encounter for general adult medical examination without abnormal findings: Secondary | ICD-10-CM | POA: Diagnosis not present

## 2022-08-24 DIAGNOSIS — F419 Anxiety disorder, unspecified: Secondary | ICD-10-CM | POA: Diagnosis not present

## 2022-08-24 DIAGNOSIS — N400 Enlarged prostate without lower urinary tract symptoms: Secondary | ICD-10-CM | POA: Diagnosis not present

## 2022-08-24 DIAGNOSIS — I2510 Atherosclerotic heart disease of native coronary artery without angina pectoris: Secondary | ICD-10-CM | POA: Diagnosis not present

## 2022-08-24 DIAGNOSIS — E512 Wernicke's encephalopathy: Secondary | ICD-10-CM | POA: Diagnosis not present

## 2022-08-24 DIAGNOSIS — F329 Major depressive disorder, single episode, unspecified: Secondary | ICD-10-CM | POA: Diagnosis not present

## 2022-08-24 DIAGNOSIS — E039 Hypothyroidism, unspecified: Secondary | ICD-10-CM | POA: Diagnosis not present

## 2022-08-24 DIAGNOSIS — E785 Hyperlipidemia, unspecified: Secondary | ICD-10-CM | POA: Diagnosis not present

## 2022-08-24 DIAGNOSIS — Z950 Presence of cardiac pacemaker: Secondary | ICD-10-CM | POA: Diagnosis not present

## 2022-08-24 DIAGNOSIS — E538 Deficiency of other specified B group vitamins: Secondary | ICD-10-CM | POA: Diagnosis not present

## 2022-09-02 ENCOUNTER — Ambulatory Visit: Payer: Medicare Other | Attending: Cardiology

## 2022-09-02 DIAGNOSIS — I4901 Ventricular fibrillation: Secondary | ICD-10-CM | POA: Insufficient documentation

## 2022-09-02 LAB — CUP PACEART INCLINIC DEVICE CHECK
Battery Remaining Longevity: 124 mo
Brady Statistic RV Percent Paced: 0.02 %
Date Time Interrogation Session: 20230906141200
HighPow Impedance: 72 Ohm
Implantable Lead Implant Date: 20110912
Implantable Lead Location: 753860
Implantable Lead Model: 7122
Implantable Pulse Generator Implant Date: 20230823
Lead Channel Impedance Value: 362.5 Ohm
Lead Channel Pacing Threshold Amplitude: 1 V
Lead Channel Pacing Threshold Amplitude: 1 V
Lead Channel Pacing Threshold Pulse Width: 0.5 ms
Lead Channel Pacing Threshold Pulse Width: 0.5 ms
Lead Channel Sensing Intrinsic Amplitude: 8 mV
Lead Channel Setting Pacing Amplitude: 2.5 V
Lead Channel Setting Pacing Pulse Width: 0.5 ms
Lead Channel Setting Sensing Sensitivity: 0.5 mV
Pulse Gen Serial Number: 211002057

## 2022-09-02 NOTE — Progress Notes (Signed)
Wound check appointment. Steri-strips removed. Wound without redness or edema. Incision edges approximated, wound well healed. Normal device function. Thresholds, sensing, and impedances consistent with implant measurements. Device programmed at approprioate safety margin for generator change. Histogram distribution appropriate for patient and level of activity. No mode switches or ventricular arrhythmias noted. Patient educated about wound care and shock plan. ROV in 3 months with implanting physician.

## 2022-09-02 NOTE — Patient Instructions (Signed)

## 2022-10-12 DIAGNOSIS — H2513 Age-related nuclear cataract, bilateral: Secondary | ICD-10-CM | POA: Diagnosis not present

## 2022-10-16 DIAGNOSIS — Z23 Encounter for immunization: Secondary | ICD-10-CM | POA: Diagnosis not present

## 2022-11-23 ENCOUNTER — Encounter: Payer: Medicare Other | Admitting: Cardiology

## 2022-11-24 ENCOUNTER — Ambulatory Visit (INDEPENDENT_AMBULATORY_CARE_PROVIDER_SITE_OTHER): Payer: Medicare Other

## 2022-11-24 DIAGNOSIS — I4901 Ventricular fibrillation: Secondary | ICD-10-CM | POA: Diagnosis not present

## 2022-11-24 LAB — CUP PACEART REMOTE DEVICE CHECK
Battery Remaining Longevity: 118 mo
Battery Remaining Percentage: 95 %
Battery Voltage: 3.07 V
Brady Statistic RV Percent Paced: 1 %
Date Time Interrogation Session: 20231128010342
HighPow Impedance: 74 Ohm
Implantable Lead Connection Status: 753985
Implantable Lead Implant Date: 20110912
Implantable Lead Location: 753860
Implantable Lead Model: 7122
Implantable Pulse Generator Implant Date: 20230823
Lead Channel Impedance Value: 380 Ohm
Lead Channel Pacing Threshold Amplitude: 1 V
Lead Channel Pacing Threshold Pulse Width: 0.5 ms
Lead Channel Sensing Intrinsic Amplitude: 7.4 mV
Lead Channel Setting Pacing Amplitude: 2.5 V
Lead Channel Setting Pacing Pulse Width: 0.5 ms
Lead Channel Setting Sensing Sensitivity: 0.5 mV
Pulse Gen Serial Number: 211002057
Zone Setting Status: 755011

## 2022-11-26 ENCOUNTER — Ambulatory Visit: Payer: Medicare Other | Attending: Cardiology | Admitting: Cardiology

## 2022-11-26 ENCOUNTER — Encounter: Payer: Self-pay | Admitting: Cardiology

## 2022-11-26 VITALS — BP 110/78 | HR 62 | Ht 75.0 in | Wt 240.0 lb

## 2022-11-26 DIAGNOSIS — I469 Cardiac arrest, cause unspecified: Secondary | ICD-10-CM | POA: Insufficient documentation

## 2022-11-26 DIAGNOSIS — I201 Angina pectoris with documented spasm: Secondary | ICD-10-CM | POA: Diagnosis not present

## 2022-11-26 NOTE — Progress Notes (Signed)
Electrophysiology Office Note   Date:  11/26/2022   ID:  Adam Orr, DOB 21-Nov-1966, MRN [id number]  PCP:  Irena Reichmann, DO  Cardiologist:  Excell Seltzer Primary Electrophysiologist:  Lorenso Quirino Jorja Loa, MD    Chief Complaint: ICD   History of Present Illness: Adam Orr is a 56 y.o. male who is being seen today for the evaluation of ICD at the request of Irena Reichmann, DO. Presenting today for electrophysiology evaluation.  He has a history significant for nonobstructive coronary artery disease.  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  He is now status post generator change 08/19/2022.  Today, denies symptoms of palpitations, chest pain, shortness of breath, orthopnea, PND, lower extremity edema, claudication, dizziness, presyncope, syncope, bleeding, or neurologic sequela. The patient is tolerating medications without difficulties.     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 (doesn't say where LV goes to)    VF (ventricular fibrillation) (HCC)    arrest   Past Surgical History:  Procedure Laterality Date   ICD GENERATOR CHANGEOUT N/A 08/19/2022   Procedure: ICD GENERATOR CHANGEOUT;  Surgeon: Regan Lemming, MD;  Location: [facility] INVASIVE CV LAB;  Service: Cardiovascular;  Laterality: N/A;   ICD implantation     with defibrillation threshold testing. [employer] Jude     Current Outpatient Medications  Medication Sig Dispense Refill   aspirin 81 MG chewable tablet Chew 81 mg by mouth daily.     Cholecalciferol (VITAMIN D-3) 5000 UNITS TABS Take 5,000 Units by mouth daily.      citalopram (CELEXA) 20 MG tablet Take 1 tablet (20 mg total) by mouth daily.     divalproex (DEPAKOTE)  125 MG DR tablet Take 125 mg by mouth daily.      folic acid (FOLVITE) 1 MG tablet Take 1 tablet (1 mg total) by mouth daily.     levothyroxine (SYNTHROID) 75 MCG tablet Take 75 mcg by mouth daily before breakfast.     LORazepam (ATIVAN) 0.5 MG tablet Take 0.5 mg by mouth daily.      Multiple Vitamin (MULTIVITAMIN) tablet Take 1 tablet by mouth daily.     simvastatin (ZOCOR) 5 MG tablet Take 1 tablet (5 mg total) by mouth daily. 30 tablet 11   terazosin (HYTRIN) 1 MG capsule Take 1 mg by mouth daily.      Thiamine HCl (VITAMIN B-1) 250 MG tablet Take 250 mg by mouth daily.     vitamin B-12 (CYANOCOBALAMIN) 1000 MCG tablet Take 1,000 mcg by mouth daily.     No current facility-administered medications for this visit.    Allergies:   Penicillins, Remeron [mirtazapine], Smallpox virus vaccine live, Melatonin, and Namenda [memantine hcl]   Social History:  The patient  reports that he has quit smoking. His smoking use included cigarettes. He started smoking about 12 years ago. He has never used smokeless tobacco. He reports current alcohol use of about 84.0 standard drinks of alcohol per week. He reports that he does not use drugs.   Family History:  The patient's family history includes Coronary artery disease in his mother; Prostate cancer in his father and paternal grandfather.   ROS:  Please see the history of present illness.   Otherwise, review of systems is positive for none.   All other systems are reviewed and negative.   PHYSICAL EXAM: VS:  BP 110/78 (BP Location: Left Arm, Patient Position: Sitting, Cuff Size: Normal)   Pulse 62   Ht 6\' 3"  (1.905 m)   Wt 240 lb (108.9 kg)   SpO2 96%   BMI 30.00 kg/m  , BMI Body mass index is 30 kg/m. GEN: Well nourished, well developed, in no acute distress  HEENT: normal  Neck: no JVD, carotid bruits, or masses Cardiac: RRR; no murmurs, rubs, or gallops,no edema  Respiratory:  clear to auscultation bilaterally, normal work of breathing GI:  soft, nontender, nondistended, + BS MS: no deformity or atrophy  Skin: warm and dry, device site well healed Neuro:  Strength and sensation are intact Psych: euthymic mood, full affect  EKG:  EKG ordered today. Personal review of the ekg ordered shows sinus rhythm  Personal review of the device interrogation today. Results in Paceart    Recent Labs: 08/10/2022: BUN 10; Creatinine, Ser 1.13; Hemoglobin 14.8; Platelets 217; Potassium 4.3; Sodium 139    Lipid Panel     Component Value Date/Time   CHOL 203 (H) 11/24/2010 2142   TRIG 195 (H) 11/24/2010 2142   HDL 54 11/24/2010 2142   CHOLHDL 3.8 Ratio 11/24/2010 2142   VLDL 39 11/24/2010 2142   LDLCALC 110 (H) 11/24/2010 2142     Wt Readings from Last 3 Encounters:  11/26/22 240 lb (108.9 kg)  08/19/22 213 lb (96.6 kg)  08/10/22 231 lb (104.8 kg)      Other studies Reviewed: Additional studies/ records that were reviewed today include: TTE 05/29/22  Review of the above records today demonstrates:   1. Left ventricular ejection fraction, by estimation, is 60 to 65%. The  left ventricle has normal function. The left ventricle has no regional  wall motion abnormalities. Left ventricular diastolic parameters are  consistent with Grade I diastolic  dysfunction (impaired relaxation).   2. Right ventricular systolic function is normal. The right ventricular  size is normal.   3. The mitral valve is normal in structure. No evidence of mitral valve  regurgitation. No evidence of mitral stenosis. Moderate mitral annular  calcification.   4. The aortic valve is normal in structure. Aortic valve regurgitation is  not visualized. No aortic stenosis is present.   5. The inferior vena cava is normal in size with greater than 50%  respiratory variability, suggesting right atrial pressure of 3 mmHg.    ASSESSMENT AND PLAN:  1.  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  Device functioning  appropriately.  Status post recent generator change.  No changes at this time.  2.  Coronary artery disease: Has a history of nonobstructive disease with vasospasm.  Continue aspirin and statin per primary cardiology.   Current medicines are reviewed at length with the patient today.   The patient does not have concerns regarding his medicines.  The following changes were made today:  none  Labs/ tests ordered today include:  Orders Placed This Encounter  Procedures   EKG 12-Lead     Disposition:   FU12 months  Signed, Jalila Goodnough 07/29/22, MD  11/26/2022 3:31 PM     [facility] [address] [place] [id number] (office) [phone] (fax)

## 2022-12-24 NOTE — Progress Notes (Signed)
Remote ICD transmission.   

## 2023-02-11 DIAGNOSIS — E039 Hypothyroidism, unspecified: Secondary | ICD-10-CM | POA: Diagnosis not present

## 2023-02-11 DIAGNOSIS — E785 Hyperlipidemia, unspecified: Secondary | ICD-10-CM | POA: Diagnosis not present

## 2023-02-11 DIAGNOSIS — Z79899 Other long term (current) drug therapy: Secondary | ICD-10-CM | POA: Diagnosis not present

## 2023-02-18 DIAGNOSIS — I2510 Atherosclerotic heart disease of native coronary artery without angina pectoris: Secondary | ICD-10-CM | POA: Diagnosis not present

## 2023-02-18 DIAGNOSIS — F329 Major depressive disorder, single episode, unspecified: Secondary | ICD-10-CM | POA: Diagnosis not present

## 2023-02-18 DIAGNOSIS — F04 Amnestic disorder due to known physiological condition: Secondary | ICD-10-CM | POA: Diagnosis not present

## 2023-02-18 DIAGNOSIS — E039 Hypothyroidism, unspecified: Secondary | ICD-10-CM | POA: Diagnosis not present

## 2023-02-18 DIAGNOSIS — E512 Wernicke's encephalopathy: Secondary | ICD-10-CM | POA: Diagnosis not present

## 2023-02-18 DIAGNOSIS — E559 Vitamin D deficiency, unspecified: Secondary | ICD-10-CM | POA: Diagnosis not present

## 2023-02-18 DIAGNOSIS — E785 Hyperlipidemia, unspecified: Secondary | ICD-10-CM | POA: Diagnosis not present

## 2023-02-18 DIAGNOSIS — F419 Anxiety disorder, unspecified: Secondary | ICD-10-CM | POA: Diagnosis not present

## 2023-02-18 DIAGNOSIS — H6122 Impacted cerumen, left ear: Secondary | ICD-10-CM | POA: Diagnosis not present

## 2023-02-18 DIAGNOSIS — R7309 Other abnormal glucose: Secondary | ICD-10-CM | POA: Diagnosis not present

## 2023-02-18 DIAGNOSIS — E538 Deficiency of other specified B group vitamins: Secondary | ICD-10-CM | POA: Diagnosis not present

## 2023-02-18 DIAGNOSIS — N400 Enlarged prostate without lower urinary tract symptoms: Secondary | ICD-10-CM | POA: Diagnosis not present

## 2023-02-23 ENCOUNTER — Ambulatory Visit: Payer: Medicare Other

## 2023-02-23 DIAGNOSIS — I4901 Ventricular fibrillation: Secondary | ICD-10-CM | POA: Diagnosis not present

## 2023-02-24 LAB — CUP PACEART REMOTE DEVICE CHECK
Battery Remaining Longevity: 115 mo
Battery Remaining Percentage: 93 %
Battery Voltage: 3.04 V
Brady Statistic RV Percent Paced: 1 %
Date Time Interrogation Session: 20240227020051
HighPow Impedance: 78 Ohm
Implantable Lead Connection Status: 753985
Implantable Lead Implant Date: 20110912
Implantable Lead Location: 753860
Implantable Lead Model: 7122
Implantable Pulse Generator Implant Date: 20230823
Lead Channel Impedance Value: 360 Ohm
Lead Channel Pacing Threshold Amplitude: 0.75 V
Lead Channel Pacing Threshold Pulse Width: 0.5 ms
Lead Channel Sensing Intrinsic Amplitude: 6.4 mV
Lead Channel Setting Pacing Amplitude: 2.5 V
Lead Channel Setting Pacing Pulse Width: 0.5 ms
Lead Channel Setting Sensing Sensitivity: 0.5 mV
Pulse Gen Serial Number: 211002057
Zone Setting Status: 755011

## 2023-03-31 NOTE — Progress Notes (Signed)
Remote ICD transmission.   

## 2023-04-14 ENCOUNTER — Telehealth: Payer: Self-pay | Admitting: Cardiovascular Disease

## 2023-04-14 NOTE — Telephone Encounter (Signed)
   Pt's mother calling, she would like for the pt to re-establish his care with Dr. Excell Seltzer. Last time he was seen 01/21.

## 2023-04-14 NOTE — Telephone Encounter (Signed)
Thx. Yeah I'm happy to see him. I've taken care of him for many years.

## 2023-04-15 NOTE — Telephone Encounter (Signed)
Returned call to patient, left message to call office back to schedule f/u appt with Excell Seltzer.

## 2023-04-16 NOTE — Telephone Encounter (Signed)
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 Explained policy, but also explained that we will gladly see him again. Pt last seen by Irene Limbo, PA in 2021, advised to f/u in 1year. Added to may schedule with Excell Seltzer.

## 2023-05-07 ENCOUNTER — Ambulatory Visit: Payer: Medicare Other | Attending: Cardiovascular Disease | Admitting: Cardiovascular Disease

## 2023-05-07 ENCOUNTER — Encounter: Payer: Self-pay | Admitting: Cardiovascular Disease

## 2023-05-07 VITALS — BP 110/70 | HR 81 | Ht 75.0 in | Wt 266.6 lb

## 2023-05-07 DIAGNOSIS — I469 Cardiac arrest, cause unspecified: Secondary | ICD-10-CM | POA: Diagnosis not present

## 2023-05-07 DIAGNOSIS — Z79899 Other long term (current) drug therapy: Secondary | ICD-10-CM | POA: Insufficient documentation

## 2023-05-07 DIAGNOSIS — I201 Angina pectoris with documented spasm: Secondary | ICD-10-CM | POA: Diagnosis not present

## 2023-05-07 DIAGNOSIS — E782 Mixed hyperlipidemia: Secondary | ICD-10-CM | POA: Insufficient documentation

## 2023-05-07 MED ORDER — ROSUVASTATIN CALCIUM 10 MG PO TABS: 10.0000 mg | ORAL_TABLET | Freq: Every day | ORAL | 3 refills | Status: DC

## 2023-05-07 NOTE — Progress Notes (Signed)
Cardiology Office Note:    Date:  05/07/2023   ID:  Adam Orr, DOB Feb 11, 1966, MRN [id number]  PCP:  Adam Reichmann, DO   [facility] Providers Cardiologist:  Adam Bollman, MD Electrophysiologist:  Adam Jorja Loa, MD     Referring MD: Adam Reichmann, DO   Chief Complaint  Patient presents with   Follow-up    Hx VF arrest    History of Present Illness:    Adam Orr is a 57 y.o. male presenting for follow-up evaluation.  His last visit with me was in 2018, but he has been followed regularly by our EP service.  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  He has undergone remote ICD implantation and underwent a generator change last year.  The patient has developed Warnicke's encephalopathy.  He is here with his mother today.  He provides limited history but has no complaints.  He specifically denies chest pain, shortness of breath, heart palpitations, syncope, or leg swelling.  He is compliant with his medications.  Denies any cardiac problems at present.  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 (doesn't say where LV goes to)    VF (ventricular fibrillation) (HCC)    arrest    Past Surgical History:  Procedure Laterality Date   ICD GENERATOR CHANGEOUT N/A 08/19/2022   Procedure: ICD GENERATOR CHANGEOUT;  Surgeon: Adam Lemming, MD;  Location: [facility] INVASIVE CV LAB;  Service: Cardiovascular;  Laterality: N/A;   ICD implantation     with defibrillation threshold testing. [employer]    Current Medications: Current Meds  Medication Sig   ascorbic acid (VITAMIN C) 500 MG tablet Take 500 mg by mouth daily.   aspirin 81 MG chewable tablet Chew 81 mg by mouth  daily.   Cholecalciferol (VITAMIN D-3) 5000 UNITS TABS Take 5,000 Units by mouth daily.    citalopram (CELEXA) 20 MG tablet Take 1 tablet (20 mg total) by mouth daily.   divalproex (DEPAKOTE) 125 MG DR tablet Take 125 mg by mouth daily.    folic acid (FOLVITE) 1 MG tablet Take 1 tablet (1 mg total) by mouth daily.   levothyroxine (SYNTHROID) 75 MCG tablet Take 75 mcg by mouth daily before breakfast.   LORazepam (ATIVAN) 0.5 MG tablet Take 0.5 mg by mouth daily.    Multiple Vitamin (MULTIVITAMIN) tablet Take 1 tablet by mouth daily.   rosuvastatin (CRESTOR) 10 MG tablet Take 1 tablet (10 mg total) by mouth daily.   terazosin (HYTRIN) 1 MG capsule Take 1 mg by mouth daily.    Thiamine HCl (VITAMIN B-1) 250 MG tablet Take 250 mg by mouth daily.   vitamin B-12 (CYANOCOBALAMIN) 1000 MCG tablet Take 1,000 mcg by mouth daily.   [DISCONTINUED] simvastatin (ZOCOR) 5 MG tablet Take 1 tablet (5 mg total) by mouth daily.     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   Vaping Use   Vaping Use: Never used  Substance and Sexual Activity   Alcohol use: Yes    Alcohol/week: 84.0 standard drinks of alcohol    Types: 84 Cans of beer per week   Drug use: No   Sexual activity: Not on file  Other Topics Concern   Not on file  Social History Narrative   Divorced, 2 children.    Patient lives at home with his mother Adam Orr.    Patient was in [employer] for 12 years.    Left-handed.   Social Determinants of Health   Financial Resource Strain: Not on file  Food Insecurity: Not on file  Transportation Needs: Not on file  Physical Activity: Not on file  Stress: Not on file  Social Connections: Not on file     Family History: The patient's family history includes Coronary artery disease in his mother; Prostate cancer in his father and paternal grandfather.  ROS:   Please see the history of present illness.    All other systems reviewed and are negative.  EKGs/Labs/Other Studies Reviewed:    The following studies were reviewed today: Cardiac Studies & Procedures       ECHOCARDIOGRAM  ECHOCARDIOGRAM COMPLETE 05/29/2022  Narrative ECHOCARDIOGRAM REPORT    Patient Name:   Adam Orr Date of Exam: 05/29/2022 Medical Rec #:  [id number]      Height:       75.0 in Accession #:    [id number]     Weight:       224.0 lb Date of Birth:  13-Feb-1966      BSA:          2.303 m Patient Age:    56 years       BP:           110/64 mmHg Patient Gender: M              HR:           64 bpm. Exam Location:  [address]  Procedure: 2D Echo, Cardiac Doppler, Color Doppler and Intracardiac Opacification Agent  Indications:    I49.01 Ventricular fibrillation  History:        Patient has prior history of Echocardiogram examinations, most recent 12/26/2012. Defibrillator; Risk Factors:Dyslipidemia and Current Smoker.  Sonographer:    Adam Orr Referring Phys: [id number] Adam Orr  IMPRESSIONS   1. Left ventricular ejection fraction, by estimation, is 60 to 65%. The left ventricle has normal function. The left ventricle has no regional wall motion abnormalities. Left ventricular diastolic parameters are consistent with Grade I diastolic dysfunction (impaired relaxation). 2. Right ventricular systolic function is normal. The right ventricular size is normal. 3. The mitral valve is normal in  structure. No evidence of mitral valve regurgitation. No evidence of mitral stenosis. Moderate mitral annular calcification. 4. The aortic valve is normal in structure. Aortic valve regurgitation is not visualized. No aortic stenosis is present. 5. The inferior vena cava is normal in size with greater than 50% respiratory variability, suggesting right atrial pressure of 3 mmHg.  FINDINGS  Left Ventricle: Left ventricular ejection fraction, by estimation, is 60 to 65%. The left ventricle has normal function. The left ventricle has no regional wall motion abnormalities. The left ventricular internal cavity size was normal in size. There is no left ventricular hypertrophy. Left ventricular diastolic parameters are consistent with Grade I diastolic dysfunction (impaired relaxation).  Right Ventricle: The right ventricular size is normal. No increase in right ventricular wall thickness. Right ventricular systolic function is normal.  Left Atrium: Left atrial size was normal in size.  Right Atrium: Right atrial size was normal in size.  Pericardium: There is no evidence of pericardial effusion.  Mitral Valve: The mitral valve is normal in structure. Moderate mitral annular calcification. No evidence of mitral valve regurgitation. No evidence of mitral valve stenosis.  Tricuspid Valve: The tricuspid valve is normal in structure. Tricuspid valve regurgitation is trivial. No evidence of tricuspid stenosis.  Aortic Valve: The aortic valve is normal in structure. Aortic valve regurgitation is not visualized. No aortic stenosis is present.  Pulmonic Valve: The pulmonic valve was normal in structure. Pulmonic valve regurgitation is not visualized. No evidence of pulmonic stenosis.  Aorta: The aortic root is normal in size and structure.  Venous: The inferior vena cava is normal in size with greater than 50% respiratory variability, suggesting right atrial pressure of 3 mmHg.  IAS/Shunts: No atrial  level shunt detected by color flow Doppler.  Additional Comments: A device lead is visualized in the right ventricle.   LEFT VENTRICLE PLAX 2D LVIDd:         4.50 cm   Diastology LVIDs:         3.05 cm   LV e' medial:    9.25 cm/s LV PW:         1.00 cm   LV E/e' medial:  6.4 LV IVS:        1.10 cm   LV e' lateral:   8.81 cm/s LVOT diam:     2.20 cm   LV E/e' lateral: 6.7 LV SV:         97 LV SV Index:   42 LVOT Area:     3.80 cm   RIGHT VENTRICLE RV Basal diam:  2.90 cm RV S prime:     13.90 cm/s TAPSE (M-mode): 2.4 cm RVSP:           21.7 mmHg  LEFT ATRIUM             Index        RIGHT ATRIUM           Index LA diam:        3.40 cm 1.48 cm/m   RA Pressure: 3.00 mmHg LA Vol (A2C):   45.0 ml 19.54 ml/m  RA Area:     14.60 cm LA Vol (A4C):   48.8 ml 21.19 ml/m  RA Volume:   34.30 ml  14.90 ml/m LA Biplane Vol: 47.3 ml 20.54 ml/m AORTIC VALVE LVOT Vmax:   130.00 cm/s LVOT Vmean:  85.600 cm/s LVOT VTI:    0.255 m  AORTA Ao Root diam: 3.30 cm Ao Asc diam:  3.10 cm  MITRAL VALVE               TRICUSPID VALVE MV Area (PHT): 2.75 cm    TR Peak grad:   18.7 mmHg MV Decel Time: 276 msec    TR Vmax:        216.00 cm/s MV E velocity: 59.20 cm/s  Estimated  RAP:  3.00 mmHg MV A velocity: 70.70 cm/s  RVSP:           21.7 mmHg MV E/A ratio:  0.84 SHUNTS Systemic VTI:  0.26 m Systemic Diam: 2.20 cm  Donato Schultz MD Electronically signed by Donato Schultz MD Signature Date/Time: 05/29/2022/2:34:08 PM    Final              EKG:  EKG is not ordered today.    Recent Labs: 08/10/2022: BUN 10; Creatinine, Ser 1.13; Hemoglobin 14.8; Platelets 217; Potassium 4.3; Sodium 139  Recent Lipid Panel    Component Value Date/Time   CHOL 203 (H) 11/24/2010 2142   TRIG 195 (H) 11/24/2010 2142   HDL 54 11/24/2010 2142   CHOLHDL 3.8 Ratio 11/24/2010 2142   VLDL 39 11/24/2010 2142   LDLCALC 110 (H) 11/24/2010 2142     Risk Assessment/Calculations:                Physical  Exam:    VS:  BP 110/70   Pulse 81   Ht 6\' 3"  (1.905 m)   Wt 266 lb 9.6 oz (120.9 kg)   SpO2 95%   BMI 33.32 kg/m     Wt Readings from Last 3 Encounters:  05/07/23 266 lb 9.6 oz (120.9 kg)  11/26/22 240 lb (108.9 kg)  08/19/22 213 lb (96.6 kg)     GEN:  Well nourished, well developed, flat affect, in no acute distress HEENT: Normal NECK: No JVD; No carotid bruits LYMPHATICS: No lymphadenopathy CARDIAC: RRR, no murmurs, rubs, gallops RESPIRATORY:  Clear to auscultation without rales, wheezing or rhonchi  ABDOMEN: Soft, non-tender, non-distended MUSCULOSKELETAL:  No edema; No deformity  SKIN: Warm and dry NEUROLOGIC:  Alert and oriented x 3 PSYCHIATRIC:  Normal affect   ASSESSMENT:    1. Coronary vasospasm (HCC)   2. Cardiac arrest (HCC)   3. Mixed hyperlipidemia   4. Medication management    PLAN:    In order of problems listed above:  No problems for several years.  He continues on aspirin for antiplatelet therapy. Status post ICD.  Generator change last year.  Followed by the EP service.  Patient remains clinically stable. No recent labs, but his last lipid panel in 2022 showed a cholesterol of 203, LDL 118, HDL 50.  Recommend discontinue low-dose simvastatin and start rosuvastatin 10 mg daily.  Check lipids and LFTs in 3 to 4 months.  Follow-up 1 year. As above, change statin agent to rosuvastatin and repeat labs in 3 to 4 months.  Otherwise no medicine changes made today.           Medication Adjustments/Labs and Tests Ordered: Current medicines are reviewed at length with the patient today.  Concerns regarding medicines are outlined above.  Orders Placed This Encounter  Procedures   Lipid panel   Hepatic function panel   Meds ordered this encounter  Medications   rosuvastatin (CRESTOR) 10 MG tablet    Sig: Take 1 tablet (10 mg total) by mouth daily.    Dispense:  90 tablet    Refill:  3    Replaces simvastatin    Patient Instructions  Medication  Instructions:  STOP Simvastatin START Rosuvastatin 10mg  daily *If you need a refill on your cardiac medications before your next appointment, please call your pharmacy*   Lab Work: Lipids, Liver in 3-4 months If you have labs (blood work) drawn today and your tests are completely normal, you Adam receive your results only by:  MyChart Message (if you have MyChart) OR A paper copy in the mail If you have any lab test that is abnormal or we need to change your treatment, we Adam call you to review the results.   Testing/Procedures: NONE   Follow-Up: At [facility], you and your health needs are our priority.  As part of our continuing mission to provide you with exceptional heart care, we have created designated Provider Care Teams.  These Care Teams include your primary Cardiologist (physician) and Advanced Practice Providers (APPs -  Physician Assistants and Nurse Practitioners) who all work together to provide you with the care you need, when you need it.  We recommend signing up for the patient portal called "MyChart".  Sign up information is provided on this After Visit Summary.  MyChart is used to connect with patients for Virtual Visits (Telemedicine).  Patients are able to view lab/test results, encounter notes, upcoming appointments, etc.  Non-urgent messages can be sent to your provider as well.   To learn more about what you can do with MyChart, go to [email or web address].    Your next appointment:   1 year(s)  Provider:   Tonny Bollman, MD        Signed, Adam Bollman, MD  05/07/2023 10:35 AM    [facility]

## 2023-05-07 NOTE — Patient Instructions (Signed)
Medication Instructions:  STOP Simvastatin START Rosuvastatin 10mg  daily *If you need a refill on your cardiac medications before your next appointment, please call your pharmacy*   Lab Work: Lipids, Liver in 3-4 months If you have labs (blood work) drawn today and your tests are completely normal, you will receive your results only by: MyChart Message (if you have MyChart) OR A paper copy in the mail If you have any lab test that is abnormal or we need to change your treatment, we will call you to review the results.   Testing/Procedures: NONE   Follow-Up: At [facility], you and your health needs are our priority.  As part of our continuing mission to provide you with exceptional heart care, we have created designated Provider Care Teams.  These Care Teams include your primary Cardiologist (physician) and Advanced Practice Providers (APPs -  Physician Assistants and Nurse Practitioners) who all work together to provide you with the care you need, when you need it.  We recommend signing up for the patient portal called "MyChart".  Sign up information is provided on this After Visit Summary.  MyChart is used to connect with patients for Virtual Visits (Telemedicine).  Patients are able to view lab/test results, encounter notes, upcoming appointments, etc.  Non-urgent messages can be sent to your provider as well.   To learn more about what you can do with MyChart, go to [email or web address].    Your next appointment:   1 year(s)  Provider:   Tonny Bollman, MD

## 2023-05-10 ENCOUNTER — Ambulatory Visit: Payer: Medicare Other | Admitting: Cardiovascular Disease

## 2023-05-25 ENCOUNTER — Ambulatory Visit (INDEPENDENT_AMBULATORY_CARE_PROVIDER_SITE_OTHER): Payer: Medicare Other

## 2023-05-25 DIAGNOSIS — I469 Cardiac arrest, cause unspecified: Secondary | ICD-10-CM

## 2023-05-26 LAB — CUP PACEART REMOTE DEVICE CHECK
Battery Remaining Longevity: 112 mo
Battery Remaining Percentage: 91 %
Battery Voltage: 3.02 V
Brady Statistic RV Percent Paced: 1 %
Date Time Interrogation Session: 20240528030039
HighPow Impedance: 71 Ohm
Implantable Lead Connection Status: 753985
Implantable Lead Implant Date: 20110912
Implantable Lead Location: 753860
Implantable Lead Model: 7122
Implantable Pulse Generator Implant Date: 20230823
Lead Channel Impedance Value: 360 Ohm
Lead Channel Pacing Threshold Amplitude: 0.75 V
Lead Channel Pacing Threshold Pulse Width: 0.5 ms
Lead Channel Sensing Intrinsic Amplitude: 6.8 mV
Lead Channel Setting Pacing Amplitude: 2.5 V
Lead Channel Setting Pacing Pulse Width: 0.5 ms
Lead Channel Setting Sensing Sensitivity: 0.5 mV
Pulse Gen Serial Number: 211002057
Zone Setting Status: 755011

## 2023-06-18 NOTE — Progress Notes (Signed)
Remote ICD transmission.   

## 2023-08-24 ENCOUNTER — Ambulatory Visit (INDEPENDENT_AMBULATORY_CARE_PROVIDER_SITE_OTHER): Payer: Medicare Other

## 2023-08-24 DIAGNOSIS — I469 Cardiac arrest, cause unspecified: Secondary | ICD-10-CM

## 2023-08-24 DIAGNOSIS — I4901 Ventricular fibrillation: Secondary | ICD-10-CM

## 2023-08-24 LAB — CUP PACEART REMOTE DEVICE CHECK
Battery Remaining Longevity: 110 mo
Battery Remaining Percentage: 89 %
Battery Voltage: 3.02 V
Brady Statistic RV Percent Paced: 1 %
Date Time Interrogation Session: 20240827020059
HighPow Impedance: 79 Ohm
Implantable Lead Connection Status: 753985
Implantable Lead Implant Date: 20110912
Implantable Lead Location: 753860
Implantable Lead Model: 7122
Implantable Pulse Generator Implant Date: 20230823
Lead Channel Impedance Value: 350 Ohm
Lead Channel Pacing Threshold Amplitude: 0.75 V
Lead Channel Pacing Threshold Pulse Width: 0.5 ms
Lead Channel Sensing Intrinsic Amplitude: 5.9 mV
Lead Channel Setting Pacing Amplitude: 2.5 V
Lead Channel Setting Pacing Pulse Width: 0.5 ms
Lead Channel Setting Sensing Sensitivity: 0.5 mV
Pulse Gen Serial Number: 211002057
Zone Setting Status: 755011

## 2023-08-25 DIAGNOSIS — E538 Deficiency of other specified B group vitamins: Secondary | ICD-10-CM | POA: Diagnosis not present

## 2023-08-25 DIAGNOSIS — R7309 Other abnormal glucose: Secondary | ICD-10-CM | POA: Diagnosis not present

## 2023-08-25 DIAGNOSIS — E785 Hyperlipidemia, unspecified: Secondary | ICD-10-CM | POA: Diagnosis not present

## 2023-08-25 DIAGNOSIS — Z125 Encounter for screening for malignant neoplasm of prostate: Secondary | ICD-10-CM | POA: Diagnosis not present

## 2023-08-25 DIAGNOSIS — E039 Hypothyroidism, unspecified: Secondary | ICD-10-CM | POA: Diagnosis not present

## 2023-08-25 DIAGNOSIS — E559 Vitamin D deficiency, unspecified: Secondary | ICD-10-CM | POA: Diagnosis not present

## 2023-08-27 ENCOUNTER — Ambulatory Visit: Payer: Medicare Other | Attending: Cardiovascular Disease

## 2023-08-27 DIAGNOSIS — Z79899 Other long term (current) drug therapy: Secondary | ICD-10-CM

## 2023-08-27 DIAGNOSIS — E782 Mixed hyperlipidemia: Secondary | ICD-10-CM

## 2023-08-27 LAB — LIPID PANEL
Chol/HDL Ratio: 2.2 ratio (ref 0.0–5.0)
Cholesterol, Total: 134 mg/dL (ref 100–199)
HDL: 62 mg/dL
LDL Chol Calc (NIH): 51 mg/dL (ref 0–99)
Triglycerides: 117 mg/dL (ref 0–149)
VLDL Cholesterol Cal: 21 mg/dL (ref 5–40)

## 2023-08-27 LAB — HEPATIC FUNCTION PANEL
ALT: 29 IU/L (ref 0–44)
AST: 31 IU/L (ref 0–40)
Albumin: 4.4 g/dL (ref 3.8–4.9)
Alkaline Phosphatase: 66 IU/L (ref 44–121)
Bilirubin Total: 1.3 mg/dL — ABNORMAL HIGH (ref 0.0–1.2)
Bilirubin, Direct: 0.34 mg/dL (ref 0.00–0.40)
Total Protein: 6.8 g/dL (ref 6.0–8.5)

## 2023-09-01 DIAGNOSIS — F04 Amnestic disorder due to known physiological condition: Secondary | ICD-10-CM | POA: Diagnosis not present

## 2023-09-01 DIAGNOSIS — E785 Hyperlipidemia, unspecified: Secondary | ICD-10-CM | POA: Diagnosis not present

## 2023-09-01 DIAGNOSIS — F329 Major depressive disorder, single episode, unspecified: Secondary | ICD-10-CM | POA: Diagnosis not present

## 2023-09-01 DIAGNOSIS — Z23 Encounter for immunization: Secondary | ICD-10-CM | POA: Diagnosis not present

## 2023-09-01 DIAGNOSIS — I2510 Atherosclerotic heart disease of native coronary artery without angina pectoris: Secondary | ICD-10-CM | POA: Diagnosis not present

## 2023-09-01 DIAGNOSIS — N400 Enlarged prostate without lower urinary tract symptoms: Secondary | ICD-10-CM | POA: Diagnosis not present

## 2023-09-01 DIAGNOSIS — Z0000 Encounter for general adult medical examination without abnormal findings: Secondary | ICD-10-CM | POA: Diagnosis not present

## 2023-09-01 DIAGNOSIS — Z79899 Other long term (current) drug therapy: Secondary | ICD-10-CM | POA: Diagnosis not present

## 2023-09-01 DIAGNOSIS — R7309 Other abnormal glucose: Secondary | ICD-10-CM | POA: Diagnosis not present

## 2023-09-01 DIAGNOSIS — E538 Deficiency of other specified B group vitamins: Secondary | ICD-10-CM | POA: Diagnosis not present

## 2023-09-01 DIAGNOSIS — E039 Hypothyroidism, unspecified: Secondary | ICD-10-CM | POA: Diagnosis not present

## 2023-09-01 DIAGNOSIS — E512 Wernicke's encephalopathy: Secondary | ICD-10-CM | POA: Diagnosis not present

## 2023-09-06 NOTE — Progress Notes (Signed)
Remote ICD transmission.   

## 2023-11-23 ENCOUNTER — Ambulatory Visit: Payer: Medicare Other

## 2023-11-23 DIAGNOSIS — I4901 Ventricular fibrillation: Secondary | ICD-10-CM

## 2023-11-23 DIAGNOSIS — I469 Cardiac arrest, cause unspecified: Secondary | ICD-10-CM

## 2023-11-23 LAB — CUP PACEART REMOTE DEVICE CHECK
Battery Remaining Longevity: 107 mo
Battery Remaining Percentage: 87 %
Battery Voltage: 3.01 V
Brady Statistic RV Percent Paced: 1 %
Date Time Interrogation Session: 20241126010153
HighPow Impedance: 79 Ohm
Implantable Lead Connection Status: 753985
Implantable Lead Implant Date: 20110912
Implantable Lead Location: 753860
Implantable Lead Model: 7122
Implantable Pulse Generator Implant Date: 20230823
Lead Channel Impedance Value: 380 Ohm
Lead Channel Pacing Threshold Amplitude: 0.75 V
Lead Channel Pacing Threshold Pulse Width: 0.5 ms
Lead Channel Sensing Intrinsic Amplitude: 6.3 mV
Lead Channel Setting Pacing Amplitude: 2.5 V
Lead Channel Setting Pacing Pulse Width: 0.5 ms
Lead Channel Setting Sensing Sensitivity: 0.5 mV
Pulse Gen Serial Number: 211002057
Zone Setting Status: 755011

## 2023-11-30 ENCOUNTER — Ambulatory Visit: Payer: Medicare Other | Attending: Cardiology | Admitting: Cardiology

## 2023-11-30 ENCOUNTER — Encounter: Payer: Self-pay | Admitting: Cardiology

## 2023-11-30 VITALS — BP 112/70 | HR 56 | Ht 75.0 in | Wt 235.4 lb

## 2023-11-30 DIAGNOSIS — I469 Cardiac arrest, cause unspecified: Secondary | ICD-10-CM | POA: Diagnosis not present

## 2023-11-30 DIAGNOSIS — I4901 Ventricular fibrillation: Secondary | ICD-10-CM

## 2023-11-30 LAB — CUP PACEART INCLINIC DEVICE CHECK
Battery Remaining Longevity: 111 mo
Brady Statistic RV Percent Paced: 0 %
Date Time Interrogation Session: 20241203164109
HighPow Impedance: 74.25 Ohm
Implantable Lead Connection Status: 753985
Implantable Lead Implant Date: 20110912
Implantable Lead Location: 753860
Implantable Lead Model: 7122
Implantable Pulse Generator Implant Date: 20230823
Lead Channel Impedance Value: 375 Ohm
Lead Channel Pacing Threshold Amplitude: 0.75 V
Lead Channel Pacing Threshold Amplitude: 0.75 V
Lead Channel Pacing Threshold Pulse Width: 0.5 ms
Lead Channel Pacing Threshold Pulse Width: 0.5 ms
Lead Channel Sensing Intrinsic Amplitude: 9.3 mV
Lead Channel Setting Pacing Amplitude: 2.5 V
Lead Channel Setting Pacing Pulse Width: 0.5 ms
Lead Channel Setting Sensing Sensitivity: 0.5 mV
Pulse Gen Serial Number: 211002057
Zone Setting Status: 755011

## 2023-11-30 NOTE — Patient Instructions (Signed)
 Medication Instructions:  Your physician recommends that you continue on your current medications as directed. Please refer to the Current Medication list given to you today.  *If you need a refill on your cardiac medications before your next appointment, please call your pharmacy*   Lab Work: None ordered   Testing/Procedures: None ordered   Follow-Up: At [facility], you and your health needs are our priority.  As part of our continuing mission to provide you with exceptional heart care, we have created designated Provider Care Teams.  These Care Teams include your primary Cardiologist (physician) and Advanced Practice Providers (APPs -  Physician Assistants and Nurse Practitioners) who all work together to provide you with the care you need, when you need it.  Your next appointment:   1 year(s)  The format for your next appointment:   In Person  Provider:   You will see one of the following Advanced Practice Providers on your designated Care Team:   Francis Dowse, [place] "Mardelle Matte" [place], [place] Canary Brim, NP   Thank you for choosing [facility]!!   Dory Horn, RN [phone]

## 2023-11-30 NOTE — Progress Notes (Signed)
  Electrophysiology Office Note:   Date:  11/30/2023  ID:  Adam Orr, DOB 03-17-1966, MRN [id number]  Primary Cardiologist: Tonny Bollman, MD Electrophysiologist: Regan Lemming, MD      History of Present Illness:   Adam Orr is a 57 y.o. male with h/o coronary artery disease, VF arrest, Warnicke's encephalopathy seen today for routine electrophysiology followup.   Since last being seen in our clinic the patient reports doing well.  He has no chest pain or shortness of breath.  Able to do all of his daily activities..  he denies chest pain, palpitations, dyspnea, PND, orthopnea, nausea, vomiting, dizziness, syncope, edema, weight gain, or early satiety.   Review of systems complete and found to be negative unless listed in HPI.      EP Information / Studies Reviewed:    EKG is ordered today. Personal review as below.  EKG Interpretation Date/Time:  Tuesday November 30 2023 15:42:22 EST Ventricular Rate:  58 PR Interval:  162 QRS Duration:  88 QT Interval:  430 QTC Calculation: 422 R Axis:   56  Text Interpretation: Sinus bradycardia No significant change since last tracing Confirmed by Mayson Sterbenz ([id number]) on 11/30/2023 4:01:14 PM   ICD Interrogation-  reviewed in detail today,  See PACEART report.  Device History: Abbott Single Chamber ICD implanted 09/08/2010 for VF arrest History of appropriate therapy: No History of AAD therapy: No   Risk Assessment/Calculations:             Physical Exam:   VS:  BP 112/70 (BP Location: Right Arm, Patient Position: Sitting, Cuff Size: Large)   Pulse (!) 56   Ht 6\' 3"  (1.905 m)   Wt 235 lb 6.4 oz (106.8 kg)   SpO2 98%   BMI 29.42 kg/m    Wt Readings from Last 3 Encounters:  11/30/23 235 lb 6.4 oz (106.8 kg)  05/07/23 266 lb 9.6 oz (120.9 kg)  11/26/22 240 lb (108.9 kg)     GEN: Well nourished, well developed in no acute distress NECK: No JVD; No carotid bruits CARDIAC: Regular rate and rhythm, no murmurs,  rubs, gallops RESPIRATORY:  Clear to auscultation without rales, wheezing or rhonchi  ABDOMEN: Soft, non-tender, non-distended EXTREMITIES:  No edema; No deformity   ASSESSMENT AND PLAN:    Cardiac arrest due to ventricular fibrillation s/p Abbott single chamber ICD  euvolemic today Stable on an appropriate medical regimen Normal ICD function See Pace Art report No changes today  2.  Coronary artery disease: Nonobstructive disease with vasospasm.  Continue plan per primary cardiology.  Disposition:   Follow up with EP APP in 12 months   Signed, Cookie Pore Jorja Loa, MD

## 2023-12-23 NOTE — Progress Notes (Signed)
Remote ICD transmission.   

## 2024-02-22 ENCOUNTER — Ambulatory Visit (INDEPENDENT_AMBULATORY_CARE_PROVIDER_SITE_OTHER): Payer: Medicare Other

## 2024-02-22 DIAGNOSIS — I4901 Ventricular fibrillation: Secondary | ICD-10-CM

## 2024-02-23 LAB — CUP PACEART REMOTE DEVICE CHECK
Battery Remaining Longevity: 105 mo
Battery Remaining Percentage: 85 %
Battery Voltage: 3.01 V
Brady Statistic RV Percent Paced: 1 %
Date Time Interrogation Session: 20250225020019
HighPow Impedance: 80 Ohm
Implantable Lead Connection Status: 753985
Implantable Lead Implant Date: 20110912
Implantable Lead Location: 753860
Implantable Lead Model: 7122
Implantable Pulse Generator Implant Date: 20230823
Lead Channel Impedance Value: 380 Ohm
Lead Channel Pacing Threshold Amplitude: 0.75 V
Lead Channel Pacing Threshold Pulse Width: 0.5 ms
Lead Channel Sensing Intrinsic Amplitude: 6.1 mV
Lead Channel Setting Pacing Amplitude: 2.5 V
Lead Channel Setting Pacing Pulse Width: 0.5 ms
Lead Channel Setting Sensing Sensitivity: 0.5 mV
Pulse Gen Serial Number: 211002057
Zone Setting Status: 755011

## 2024-02-29 DIAGNOSIS — E785 Hyperlipidemia, unspecified: Secondary | ICD-10-CM | POA: Diagnosis not present

## 2024-02-29 DIAGNOSIS — E538 Deficiency of other specified B group vitamins: Secondary | ICD-10-CM | POA: Diagnosis not present

## 2024-02-29 DIAGNOSIS — Z79899 Other long term (current) drug therapy: Secondary | ICD-10-CM | POA: Diagnosis not present

## 2024-02-29 DIAGNOSIS — R7309 Other abnormal glucose: Secondary | ICD-10-CM | POA: Diagnosis not present

## 2024-02-29 DIAGNOSIS — E039 Hypothyroidism, unspecified: Secondary | ICD-10-CM | POA: Diagnosis not present

## 2024-02-29 DIAGNOSIS — I2510 Atherosclerotic heart disease of native coronary artery without angina pectoris: Secondary | ICD-10-CM | POA: Diagnosis not present

## 2024-03-01 DIAGNOSIS — H2513 Age-related nuclear cataract, bilateral: Secondary | ICD-10-CM | POA: Diagnosis not present

## 2024-03-01 DIAGNOSIS — H25013 Cortical age-related cataract, bilateral: Secondary | ICD-10-CM | POA: Diagnosis not present

## 2024-03-07 DIAGNOSIS — E039 Hypothyroidism, unspecified: Secondary | ICD-10-CM | POA: Diagnosis not present

## 2024-03-07 DIAGNOSIS — E785 Hyperlipidemia, unspecified: Secondary | ICD-10-CM | POA: Diagnosis not present

## 2024-03-07 DIAGNOSIS — I2510 Atherosclerotic heart disease of native coronary artery without angina pectoris: Secondary | ICD-10-CM | POA: Diagnosis not present

## 2024-03-07 DIAGNOSIS — R7309 Other abnormal glucose: Secondary | ICD-10-CM | POA: Diagnosis not present

## 2024-03-07 DIAGNOSIS — E538 Deficiency of other specified B group vitamins: Secondary | ICD-10-CM | POA: Diagnosis not present

## 2024-03-07 DIAGNOSIS — Z79899 Other long term (current) drug therapy: Secondary | ICD-10-CM | POA: Diagnosis not present

## 2024-03-30 NOTE — Addendum Note (Signed)
 Addended by: Elease Etienne A on: 03/30/2024 11:01 AM   Modules accepted: Orders

## 2024-03-30 NOTE — Progress Notes (Signed)
 Remote ICD transmission.

## 2024-05-19 ENCOUNTER — Other Ambulatory Visit: Payer: Self-pay | Admitting: Cardiovascular Disease

## 2024-05-19 DIAGNOSIS — E782 Mixed hyperlipidemia: Secondary | ICD-10-CM

## 2024-05-23 ENCOUNTER — Ambulatory Visit (INDEPENDENT_AMBULATORY_CARE_PROVIDER_SITE_OTHER): Payer: Medicare Other

## 2024-05-23 DIAGNOSIS — I4901 Ventricular fibrillation: Secondary | ICD-10-CM

## 2024-05-24 LAB — CUP PACEART REMOTE DEVICE CHECK
Battery Remaining Longevity: 103 mo
Battery Remaining Percentage: 83 %
Battery Voltage: 3.01 V
Brady Statistic RV Percent Paced: 1 %
Date Time Interrogation Session: 20250527030152
HighPow Impedance: 81 Ohm
Implantable Lead Connection Status: 753985
Implantable Lead Implant Date: 20110912
Implantable Lead Location: 753860
Implantable Lead Model: 7122
Implantable Pulse Generator Implant Date: 20230823
Lead Channel Impedance Value: 360 Ohm
Lead Channel Pacing Threshold Amplitude: 0.75 V
Lead Channel Pacing Threshold Pulse Width: 0.5 ms
Lead Channel Sensing Intrinsic Amplitude: 5.7 mV
Lead Channel Setting Pacing Amplitude: 2.5 V
Lead Channel Setting Pacing Pulse Width: 0.5 ms
Lead Channel Setting Sensing Sensitivity: 0.5 mV
Pulse Gen Serial Number: 211002057
Zone Setting Status: 755011

## 2024-05-28 ENCOUNTER — Ambulatory Visit: Payer: Self-pay | Admitting: Cardiology

## 2024-07-10 NOTE — Progress Notes (Signed)
 Remote ICD transmission.

## 2024-07-10 NOTE — Addendum Note (Signed)
 Addended by: TAWNI DRILLING D on: 07/10/2024 05:02 PM   Modules accepted: Orders

## 2024-08-22 ENCOUNTER — Ambulatory Visit (INDEPENDENT_AMBULATORY_CARE_PROVIDER_SITE_OTHER): Payer: Medicare Other

## 2024-08-22 DIAGNOSIS — I4901 Ventricular fibrillation: Secondary | ICD-10-CM

## 2024-08-23 ENCOUNTER — Ambulatory Visit: Payer: Self-pay | Admitting: Cardiology

## 2024-08-23 LAB — CUP PACEART REMOTE DEVICE CHECK
Battery Remaining Longevity: 100 mo
Battery Remaining Percentage: 81 %
Battery Voltage: 3.01 V
Brady Statistic RV Percent Paced: 1 %
Date Time Interrogation Session: 20250826020034
HighPow Impedance: 80 Ohm
Implantable Lead Connection Status: 753985
Implantable Lead Implant Date: 20110912
Implantable Lead Location: 753860
Implantable Lead Model: 7122
Implantable Pulse Generator Implant Date: 20230823
Lead Channel Impedance Value: 360 Ohm
Lead Channel Pacing Threshold Amplitude: 0.75 V
Lead Channel Pacing Threshold Pulse Width: 0.5 ms
Lead Channel Sensing Intrinsic Amplitude: 6.8 mV
Lead Channel Setting Pacing Amplitude: 2.5 V
Lead Channel Setting Pacing Pulse Width: 0.5 ms
Lead Channel Setting Sensing Sensitivity: 0.5 mV
Pulse Gen Serial Number: 211002057
Zone Setting Status: 755011

## 2024-08-29 DIAGNOSIS — I2510 Atherosclerotic heart disease of native coronary artery without angina pectoris: Secondary | ICD-10-CM | POA: Diagnosis not present

## 2024-08-29 LAB — LAB REPORT - SCANNED
A1c: 5.7
EGFR: 80

## 2024-09-05 DIAGNOSIS — Z23 Encounter for immunization: Secondary | ICD-10-CM | POA: Diagnosis not present

## 2024-09-05 DIAGNOSIS — Z0000 Encounter for general adult medical examination without abnormal findings: Secondary | ICD-10-CM | POA: Diagnosis not present

## 2024-09-05 DIAGNOSIS — E559 Vitamin D deficiency, unspecified: Secondary | ICD-10-CM | POA: Diagnosis not present

## 2024-09-05 DIAGNOSIS — E512 Wernicke's encephalopathy: Secondary | ICD-10-CM | POA: Diagnosis not present

## 2024-09-05 DIAGNOSIS — F419 Anxiety disorder, unspecified: Secondary | ICD-10-CM | POA: Diagnosis not present

## 2024-09-05 DIAGNOSIS — Z950 Presence of cardiac pacemaker: Secondary | ICD-10-CM | POA: Diagnosis not present

## 2024-09-05 DIAGNOSIS — E039 Hypothyroidism, unspecified: Secondary | ICD-10-CM | POA: Diagnosis not present

## 2024-09-05 DIAGNOSIS — N400 Enlarged prostate without lower urinary tract symptoms: Secondary | ICD-10-CM | POA: Diagnosis not present

## 2024-09-05 DIAGNOSIS — I2510 Atherosclerotic heart disease of native coronary artery without angina pectoris: Secondary | ICD-10-CM | POA: Diagnosis not present

## 2024-09-05 DIAGNOSIS — F329 Major depressive disorder, single episode, unspecified: Secondary | ICD-10-CM | POA: Diagnosis not present

## 2024-09-05 DIAGNOSIS — E538 Deficiency of other specified B group vitamins: Secondary | ICD-10-CM | POA: Diagnosis not present

## 2024-09-05 DIAGNOSIS — F04 Amnestic disorder due to known physiological condition: Secondary | ICD-10-CM | POA: Diagnosis not present

## 2024-09-12 NOTE — Progress Notes (Signed)
Remote ICD Transmission.

## 2024-09-18 ENCOUNTER — Ambulatory Visit: Attending: Cardiovascular Disease | Admitting: Cardiovascular Disease

## 2024-09-18 ENCOUNTER — Encounter: Payer: Self-pay | Admitting: Cardiovascular Disease

## 2024-09-18 VITALS — BP 116/70 | HR 76 | Ht 75.0 in | Wt 236.8 lb

## 2024-09-18 DIAGNOSIS — I201 Angina pectoris with documented spasm: Secondary | ICD-10-CM | POA: Diagnosis not present

## 2024-09-18 DIAGNOSIS — E782 Mixed hyperlipidemia: Secondary | ICD-10-CM | POA: Diagnosis not present

## 2024-09-18 NOTE — Patient Instructions (Signed)

## 2024-09-18 NOTE — Progress Notes (Signed)
 Cardiology Office Note:    Date:  09/18/2024   ID:  Adam Orr, DOB [birth year], MRN [id number]  PCP:  Gerome Brunet, DO   [facility] Providers Cardiologist:  Ozell Fell, MD Electrophysiologist:  Will Gladis Norton, MD     Referring MD: Gerome Brunet, DO   Chief Complaint  Patient presents with   Coronary Artery Disease    History of Present Illness:    Adam Orr is a 58 y.o. 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  Patient is here with his mother today.  He has been doing fine from a cardiac perspective.  He has no specific complaints. Today, he denies symptoms of palpitations, chest pain, shortness of breath, orthopnea, PND, lower extremity edema, dizziness, or syncope.  His device is followed regularly and we discussed his remote transmissions which showed demonstrated no significant problems.  Current Medications: Current Meds  Medication Sig   ascorbic acid (VITAMIN C) 500 MG tablet Take 500 mg by mouth daily.   aspirin  81 MG chewable tablet Chew 81 mg by mouth daily.   Cholecalciferol (VITAMIN D -3) 5000 UNITS TABS Take 5,000 Units by mouth daily.    citalopram  (CELEXA ) 20 MG tablet Take 1 tablet (20 mg total) by mouth daily.   divalproex (DEPAKOTE) 125 MG DR tablet Take 125 mg by mouth daily.    folic acid  (FOLVITE ) 1 MG tablet Take 1 tablet (1 mg total) by mouth daily.   levothyroxine  (SYNTHROID ) 75 MCG tablet Take 75 mcg by mouth daily before breakfast.   LORazepam  (ATIVAN ) 0.5 MG tablet Take 0.5 mg by mouth daily.    Multiple Vitamin (MULTIVITAMIN) tablet Take 1 tablet by mouth daily.   rosuvastatin  (CRESTOR ) 10 MG tablet Take 1 tablet by mouth once daily   terazosin (HYTRIN) 1 MG capsule Take 1 mg by mouth daily.    Thiamine  HCl (VITAMIN B-1) 250 MG tablet Take 250 mg by mouth daily.   vitamin B-12 (CYANOCOBALAMIN ) 1000 MCG tablet Take 1,000 mcg by mouth daily.      Allergies:   Penicillins, Remeron  [mirtazapine ], Smallpox virus vaccine live, Melatonin, and Namenda  [memantine  hcl]   ROS:   Please see the history of present illness.    All other systems reviewed and are negative.  EKGs/Labs/Other Studies Reviewed:    The following studies were reviewed today: Cardiac Studies & Procedures   ______________________________________________________________________________________________     ECHOCARDIOGRAM  ECHOCARDIOGRAM COMPLETE 05/29/2022  Narrative ECHOCARDIOGRAM REPORT    Patient Name:   Adam Orr Date of Exam: 05/29/2022 Medical Rec #:  [id number]      Height:       75.0 in Accession #:    [id number]     Weight:       224.0 lb Date of Birth:  10/06/1966      BSA:          2.303 m Patient Age:    56 years       BP:           110/64 mmHg Patient Gender: M              HR:           64 bpm. Exam Location:  [address]  Procedure: 2D Echo, Cardiac Doppler, Color Doppler and Intracardiac Opacification Agent  Indications:    I49.01 Ventricular fibrillation  History:        Patient has prior history  of Echocardiogram examinations, most recent 12/26/2012. Defibrillator; Risk Factors:Dyslipidemia and Current Smoker.  Sonographer:    Jon Hacker RCS Referring Phys: [id number] Camiyah Friberg ANDREW TILLERY  IMPRESSIONS   1. Left ventricular ejection fraction, by estimation, is 60 to 65%. The left ventricle has normal function. The left ventricle has no regional wall motion abnormalities. Left ventricular diastolic parameters are consistent with Grade I diastolic dysfunction (impaired relaxation). 2. Right ventricular systolic function is normal. The right ventricular size is normal. 3. The mitral valve is normal in structure. No evidence of mitral valve regurgitation. No evidence of mitral stenosis. Moderate mitral annular calcification. 4. The aortic valve is normal in structure. Aortic valve regurgitation is not visualized. No aortic  stenosis is present. 5. The inferior vena cava is normal in size with greater than 50% respiratory variability, suggesting right atrial pressure of 3 mmHg.  FINDINGS Left Ventricle: Left ventricular ejection fraction, by estimation, is 60 to 65%. The left ventricle has normal function. The left ventricle has no regional wall motion abnormalities. The left ventricular internal cavity size was normal in size. There is no left ventricular hypertrophy. Left ventricular diastolic parameters are consistent with Grade I diastolic dysfunction (impaired relaxation).  Right Ventricle: The right ventricular size is normal. No increase in right ventricular wall thickness. Right ventricular systolic function is normal.  Left Atrium: Left atrial size was normal in size.  Right Atrium: Right atrial size was normal in size.  Pericardium: There is no evidence of pericardial effusion.  Mitral Valve: The mitral valve is normal in structure. Moderate mitral annular calcification. No evidence of mitral valve regurgitation. No evidence of mitral valve stenosis.  Tricuspid Valve: The tricuspid valve is normal in structure. Tricuspid valve regurgitation is trivial. No evidence of tricuspid stenosis.  Aortic Valve: The aortic valve is normal in structure. Aortic valve regurgitation is not visualized. No aortic stenosis is present.  Pulmonic Valve: The pulmonic valve was normal in structure. Pulmonic valve regurgitation is not visualized. No evidence of pulmonic stenosis.  Aorta: The aortic root is normal in size and structure.  Venous: The inferior vena cava is normal in size with greater than 50% respiratory variability, suggesting right atrial pressure of 3 mmHg.  IAS/Shunts: No atrial level shunt detected by color flow Doppler.  Additional Comments: A device lead is visualized in the right ventricle.   LEFT VENTRICLE PLAX 2D LVIDd:         4.50 cm   Diastology LVIDs:         3.05 cm   LV e' medial:     9.25 cm/s LV PW:         1.00 cm   LV E/e' medial:  6.4 LV IVS:        1.10 cm   LV e' lateral:   8.81 cm/s LVOT diam:     2.20 cm   LV E/e' lateral: 6.7 LV SV:         97 LV SV Index:   42 LVOT Area:     3.80 cm   RIGHT VENTRICLE RV Basal diam:  2.90 cm RV S prime:     13.90 cm/s TAPSE (M-mode): 2.4 cm RVSP:           21.7 mmHg  LEFT ATRIUM             Index        RIGHT ATRIUM           Index LA diam:  3.40 cm 1.48 cm/m   RA Pressure: 3.00 mmHg LA Vol (A2C):   45.0 ml 19.54 ml/m  RA Area:     14.60 cm LA Vol (A4C):   48.8 ml 21.19 ml/m  RA Volume:   34.30 ml  14.90 ml/m LA Biplane Vol: 47.3 ml 20.54 ml/m AORTIC VALVE LVOT Vmax:   130.00 cm/s LVOT Vmean:  85.600 cm/s LVOT VTI:    0.255 m  AORTA Ao Root diam: 3.30 cm Ao Asc diam:  3.10 cm  MITRAL VALVE               TRICUSPID VALVE MV Area (PHT): 2.75 cm    TR Peak grad:   18.7 mmHg MV Decel Time: 276 msec    TR Vmax:        216.00 cm/s MV E velocity: 59.20 cm/s  Estimated RAP:  3.00 mmHg MV A velocity: 70.70 cm/s  RVSP:           21.7 mmHg MV E/A ratio:  0.84 SHUNTS Systemic VTI:  0.26 m Systemic Diam: 2.20 cm  Oneil Parchment MD Electronically signed by Oneil Parchment MD Signature Date/Time: 05/29/2022/2:34:08 PM    Final          ______________________________________________________________________________________________      EKG:        Recent Labs: No results found for requested labs within last 365 days.  Recent Lipid Panel    Component Value Date/Time   CHOL 134 08/27/2023 0822   TRIG 117 08/27/2023 0822   HDL 62 08/27/2023 0822   CHOLHDL 2.2 08/27/2023 0822   CHOLHDL 3.8 Ratio 11/24/2010 2142   VLDL 39 11/24/2010 2142   LDLCALC 51 08/27/2023 0822     Risk Assessment/Calculations:                Physical Exam:    VS:  BP 116/70   Pulse 76   Ht 6' 3 (1.905 m)   Wt 236 lb 12.8 oz (107.4 kg)   SpO2 97%   BMI 29.60 kg/m     Wt Readings from Last 3 Encounters:   09/18/24 236 lb 12.8 oz (107.4 kg)  11/30/23 235 lb 6.4 oz (106.8 kg)  05/07/23 266 lb 9.6 oz (120.9 kg)     GEN:  Well nourished, well developed in no acute distress HEENT: Normal NECK: No JVD; No carotid bruits LYMPHATICS: No lymphadenopathy CARDIAC: RRR, no murmurs, rubs, gallops RESPIRATORY:  Clear to auscultation without rales, wheezing or rhonchi  ABDOMEN: Soft, non-tender, non-distended MUSCULOSKELETAL:  No edema; No deformity  SKIN: Warm and dry NEUROLOGIC:  Alert and oriented x 3 PSYCHIATRIC:  Normal affect   Assessment & Plan Coronary vasospasm (HCC) Clinically stable.  Continue aspirin  81 mg daily.  Continue rosuvastatin . Mixed hyperlipidemia Recent labs reviewed from PCP office.  Cholesterol is 139, LDL 56.  Continue current management.      Medication Adjustments/Labs and Tests Ordered: Current medicines are reviewed at length with the patient today.  Concerns regarding medicines are outlined above.  No orders of the defined types were placed in this encounter.  No orders of the defined types were placed in this encounter.   Patient Instructions  Medication Instructions:  No medication changes were made at this visit. Continue current regimen.   *If you need a refill on your cardiac medications before your next appointment, please call your pharmacy*  Lab Work: None ordered today. If you have labs (blood work) drawn today and your tests are completely normal, you  will receive your results only by: MyChart Message (if you have MyChart) OR A paper copy in the mail If you have any lab test that is abnormal or we need to change your treatment, we will call you to review the results.  Testing/Procedures: None ordered today.  Follow-Up: At [facility], you and your health needs are our priority.  As part of our continuing mission to provide you with exceptional heart care, our providers are all part of one team.  This team includes your primary  Cardiologist (physician) and Advanced Practice Providers or APPs (Physician Assistants and Nurse Practitioners) who all work together to provide you with the care you need, when you need it.  Your next appointment:   1 year(s)  Provider:   Ozell Fell, MD      Signed, Ozell Fell, MD  09/18/2024 1:05 PM    [facility]

## 2024-09-18 NOTE — Assessment & Plan Note (Signed)
 Recent labs reviewed from PCP office.  Cholesterol is 139, LDL 56.  Continue current management.

## 2024-11-21 ENCOUNTER — Ambulatory Visit (INDEPENDENT_AMBULATORY_CARE_PROVIDER_SITE_OTHER): Payer: Medicare Other

## 2024-11-21 DIAGNOSIS — I4901 Ventricular fibrillation: Secondary | ICD-10-CM | POA: Diagnosis not present

## 2024-11-21 LAB — CUP PACEART REMOTE DEVICE CHECK
Battery Remaining Longevity: 97 mo
Battery Remaining Percentage: 79 %
Battery Voltage: 3.01 V
Brady Statistic RV Percent Paced: 1 %
Date Time Interrogation Session: 20251125020152
HighPow Impedance: 78 Ohm
Implantable Lead Connection Status: 753985
Implantable Lead Implant Date: 20110912
Implantable Lead Location: 753860
Implantable Lead Model: 7122
Implantable Pulse Generator Implant Date: 20230823
Lead Channel Impedance Value: 350 Ohm
Lead Channel Pacing Threshold Amplitude: 0.75 V
Lead Channel Pacing Threshold Pulse Width: 0.5 ms
Lead Channel Sensing Intrinsic Amplitude: 5.8 mV
Lead Channel Setting Pacing Amplitude: 2.5 V
Lead Channel Setting Pacing Pulse Width: 0.5 ms
Lead Channel Setting Sensing Sensitivity: 0.5 mV
Pulse Gen Serial Number: 211002057
Zone Setting Status: 755011

## 2024-11-22 ENCOUNTER — Ambulatory Visit: Payer: Self-pay | Admitting: Cardiology

## 2024-11-22 NOTE — Progress Notes (Signed)
 Remote ICD Transmission

## 2025-02-07 ENCOUNTER — Ambulatory Visit: Admitting: Cardiology

## 2025-02-20 ENCOUNTER — Ambulatory Visit: Payer: Medicare Other

## 2025-05-22 ENCOUNTER — Ambulatory Visit: Payer: Medicare Other
